# Patient Record
Sex: Female | Born: 1985
Health system: Southern US, Community
[De-identification: ages and names within clinical notes are randomized; demographics above are authoritative.]

## PROBLEM LIST (undated history)

## (undated) ENCOUNTER — Inpatient Hospital Stay (HOSPITAL_COMMUNITY): Payer: Self-pay

## (undated) DIAGNOSIS — A048 Other specified bacterial intestinal infections: Secondary | ICD-10-CM

## (undated) DIAGNOSIS — M549 Dorsalgia, unspecified: Secondary | ICD-10-CM

## (undated) DIAGNOSIS — Z8719 Personal history of other diseases of the digestive system: Secondary | ICD-10-CM

## (undated) DIAGNOSIS — F419 Anxiety disorder, unspecified: Secondary | ICD-10-CM

## (undated) DIAGNOSIS — R87629 Unspecified abnormal cytological findings in specimens from vagina: Secondary | ICD-10-CM

## (undated) DIAGNOSIS — Z8619 Personal history of other infectious and parasitic diseases: Secondary | ICD-10-CM

## (undated) DIAGNOSIS — W880XXA Exposure to X-rays, initial encounter: Secondary | ICD-10-CM

## (undated) DIAGNOSIS — Z8049 Family history of malignant neoplasm of other genital organs: Secondary | ICD-10-CM

## (undated) DIAGNOSIS — D649 Anemia, unspecified: Secondary | ICD-10-CM

## (undated) DIAGNOSIS — G43909 Migraine, unspecified, not intractable, without status migrainosus: Secondary | ICD-10-CM

## (undated) DIAGNOSIS — G4730 Sleep apnea, unspecified: Secondary | ICD-10-CM

## (undated) DIAGNOSIS — U071 COVID-19: Secondary | ICD-10-CM

## (undated) DIAGNOSIS — Z803 Family history of malignant neoplasm of breast: Secondary | ICD-10-CM

## (undated) HISTORY — PX: NO PAST SURGERIES: SHX2092

## (undated) HISTORY — DX: Migraine, unspecified, not intractable, without status migrainosus: G43.909

## (undated) HISTORY — DX: Unspecified abnormal cytological findings in specimens from vagina: R87.629

## (undated) HISTORY — DX: Personal history of other infectious and parasitic diseases: Z86.19

## (undated) HISTORY — DX: Family history of malignant neoplasm of breast: Z80.3

## (undated) HISTORY — DX: Anemia, unspecified: D64.9

## (undated) HISTORY — DX: Anxiety disorder, unspecified: F41.9

## (undated) HISTORY — DX: Dorsalgia, unspecified: M54.9

## (undated) HISTORY — DX: Sleep apnea, unspecified: G47.30

## (undated) HISTORY — DX: Personal history of other diseases of the digestive system: Z87.19

## (undated) HISTORY — DX: Family history of malignant neoplasm of other genital organs: Z80.49

## (undated) HISTORY — DX: Exposure to x-rays, initial encounter: W88.0XXA

---
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
## 2007-01-07 DIAGNOSIS — G43909 Migraine, unspecified, not intractable, without status migrainosus: Secondary | ICD-10-CM

## 2007-01-07 DIAGNOSIS — D649 Anemia, unspecified: Secondary | ICD-10-CM

## 2007-01-07 HISTORY — DX: Anemia, unspecified: D64.9

## 2007-01-07 HISTORY — DX: Migraine, unspecified, not intractable, without status migrainosus: G43.909

## 2008-01-07 DIAGNOSIS — Z8719 Personal history of other diseases of the digestive system: Secondary | ICD-10-CM

## 2008-01-07 DIAGNOSIS — M549 Dorsalgia, unspecified: Secondary | ICD-10-CM

## 2008-01-07 HISTORY — DX: Dorsalgia, unspecified: M54.9

## 2008-01-07 HISTORY — DX: Personal history of other diseases of the digestive system: Z87.19

## 2010-01-06 DIAGNOSIS — G4730 Sleep apnea, unspecified: Secondary | ICD-10-CM

## 2010-01-06 DIAGNOSIS — W880XXA Exposure to X-rays, initial encounter: Secondary | ICD-10-CM

## 2010-01-06 HISTORY — DX: Exposure to x-rays, initial encounter: W88.0XXA

## 2010-01-06 HISTORY — DX: Sleep apnea, unspecified: G47.30

## 2011-01-07 NOTE — L&D Delivery Note (Signed)
Delivery Note At 12:37 PM a viable female was delivered via Vaginal, Spontaneous Delivery (Presentation: ROA ).  Easy delivery of the head, loop of cord presenting above shoulder easy delivery of the shoulders, bulb suction mouth and nares, baby placed on pt abdomen cord doubly clamped and cut by support person. APGAR: 8, 9; weight .   Placenta status: schultze sponteneous intact Cord: 3 vessels  Anesthesia: Epidural  Episiotomy: none Lacerations: Vaginal lac Suture Repair: 3.0 monocryl for good hemostasis Est. Blood Loss (mL): 200  Mom to postpartum.  Baby to rooming in.  Kaelah Hayashi 10/14/2011, 1:12 PM

## 2011-02-18 ENCOUNTER — Encounter: Payer: Self-pay | Admitting: Family Medicine

## 2011-02-19 ENCOUNTER — Encounter: Payer: Self-pay | Admitting: Family Medicine

## 2011-02-19 ENCOUNTER — Other Ambulatory Visit (HOSPITAL_COMMUNITY)
Admission: RE | Admit: 2011-02-19 | Discharge: 2011-02-19 | Disposition: A | Discharge status: Home / Self Care | Payer: Self-pay | Source: Ambulatory Visit | Attending: Family Medicine | Admitting: Family Medicine

## 2011-02-19 ENCOUNTER — Ambulatory Visit (INDEPENDENT_AMBULATORY_CARE_PROVIDER_SITE_OTHER): Payer: Medicaid Other | Admitting: Family Medicine

## 2011-02-19 ENCOUNTER — Other Ambulatory Visit (HOSPITAL_COMMUNITY)
Admission: RE | Admit: 2011-02-19 | Discharge: 2011-02-19 | Disposition: A | Discharge status: Home / Self Care | Payer: Medicaid Other | Source: Ambulatory Visit | Attending: Family Medicine | Admitting: Family Medicine

## 2011-02-19 VITALS — BP 100/68 | HR 80 | Temp 98.3°F | Ht 62.5 in | Wt 139.1 lb

## 2011-02-19 DIAGNOSIS — K219 Gastro-esophageal reflux disease without esophagitis: Secondary | ICD-10-CM | POA: Insufficient documentation

## 2011-02-19 DIAGNOSIS — Z113 Encounter for screening for infections with a predominantly sexual mode of transmission: Secondary | ICD-10-CM | POA: Insufficient documentation

## 2011-02-19 DIAGNOSIS — K649 Unspecified hemorrhoids: Secondary | ICD-10-CM | POA: Insufficient documentation

## 2011-02-19 DIAGNOSIS — Z01419 Encounter for gynecological examination (general) (routine) without abnormal findings: Secondary | ICD-10-CM | POA: Insufficient documentation

## 2011-02-19 DIAGNOSIS — Z3201 Encounter for pregnancy test, result positive: Secondary | ICD-10-CM

## 2011-02-19 DIAGNOSIS — K644 Residual hemorrhoidal skin tags: Secondary | ICD-10-CM | POA: Insufficient documentation

## 2011-02-19 DIAGNOSIS — Z3400 Encounter for supervision of normal first pregnancy, unspecified trimester: Secondary | ICD-10-CM | POA: Insufficient documentation

## 2011-02-19 DIAGNOSIS — N912 Amenorrhea, unspecified: Secondary | ICD-10-CM

## 2011-02-19 DIAGNOSIS — Z331 Pregnant state, incidental: Secondary | ICD-10-CM

## 2011-02-19 DIAGNOSIS — IMO0002 Reserved for concepts with insufficient information to code with codable children: Secondary | ICD-10-CM | POA: Insufficient documentation

## 2011-02-19 LAB — POCT URINE PREGNANCY: Preg Test, Ur: POSITIVE

## 2011-02-19 MED ORDER — OMEPRAZOLE 40 MG PO CPDR: 40.0000 mg | DELAYED_RELEASE_CAPSULE | Freq: Every day | ORAL | Status: DC

## 2011-02-19 MED ORDER — ACETAMINOPHEN 500 MG PO TABS: 500.0000 mg | ORAL_TABLET | Freq: Four times a day (QID) | ORAL | Status: AC | PRN

## 2011-02-19 MED ORDER — PRENATAL RX 60-1 MG PO TABS: 1.0000 | ORAL_TABLET | Freq: Every day | ORAL | Status: DC

## 2011-02-19 MED ORDER — CALCIUM POLYCARBOPHIL 625 MG PO TABS: 625.0000 mg | ORAL_TABLET | Freq: Every day | ORAL | Status: DC

## 2011-02-19 MED ORDER — TUCKS 50 % EX PADS: 1.0000 "application " | MEDICATED_PAD | CUTANEOUS | Status: DC | PRN

## 2011-02-19 MED ORDER — VITAMIN B-6 100 MG PO TABS: 100.0000 mg | ORAL_TABLET | Freq: Every day | ORAL | Status: DC

## 2011-02-19 NOTE — Assessment & Plan Note (Addendum)
A: Pregnancy incidental finding at [redacted]w[redacted]d. P: -Pap done. -OB labs ordered. -continue prenatal vitamins -B6 for nausea  -reviewed early pregnancy precautions.  -f/u in  4 weeks.  -tylenol prn headache and low back pain.

## 2011-02-19 NOTE — Assessment & Plan Note (Signed)
A: GERD. Anticipate this will get worse with pregnancy. P: -TUMS -Prilosec

## 2011-02-19 NOTE — Patient Instructions (Signed)
[name] [name],  Thank you for coming to see me today. Congratulations on your pregnancy!  For nausea: take vitamin B6, eat small meals, eat cracker before getting up in the morning.  For your reflux: take the medication, omeprazole.  For back pain and headaches: tylenol is safe if pregnancy and we will start with this.  Avoid potentially harmful foods: raw fish, deli meats and cheeses. Avoid potentially harmful exposures: cigarette smoke, alcohol.   F/u in 4 weeks.   Dr. Armen Pickup   Pregnancy If you are planning on getting pregnant, it is a good idea to make a preconception appointment with your care- giver to discuss having a healthy lifestyle before getting pregnant. Such as, diet, weight, exercise, taking prenatal vitamins especially folic acid (it helps prevent brain and spinal cord defects), avoiding alcohol, smoking and illegal drugs, medical problems (diabetes, convulsions), family history of genetic problems, working conditions and immunizations. It is better to have knowledge of these things and do something about them before getting pregnant. In your pregnancy, it is important to follow certain guidelines to have a healthy baby. It is very important to get good prenatal care and follow your caregiver's instructions. Prenatal care includes all the medical care you receive before your baby's birth. This helps to prevent problems during the pregnancy and childbirth. HOME CARE INSTRUCTIONS   Start your prenatal visits by the 12th week of pregnancy or before when possible. They are usually scheduled monthly at first. They are more often in the last 2 months before delivery. It is important that you keep your caregiver's appointments and follow your caregiver's instructions regarding medication use, exercise, and diet.   During pregnancy, you are providing food for you and your baby. Eat a regular, well-balanced diet. Choose foods such as meat, fish, milk and other dairy products, vegetables, fruits,  whole-grain breads and cereals. Your caregiver will inform you of the ideal weight gain depending on your current height and weight. Drink lots of liquids. Try to drink 8 glasses of water a day.   Alcohol is associated with a number of birth defects including fetal alcohol syndrome. It is best to avoid alcohol completely. Smoking will cause low birth rate and prematurity. Use of alcohol and nicotine during your pregnancy also increases the chances that your child will be chemically dependent later in their life and may contribute to SIDS (Sudden Infant Death Syndrome).   Do not use illegal drugs.   Only take prescription or over-the-counter medications that are recommended by your caregiver. Other medications can cause genetic and physical problems in the baby.   Morning sickness can often be helped by keeping soda crackers at the bedside. Eat a couple before arising in the morning.   A sexual relationship may be continued until near the end of pregnancy if there are no other problems such as early (premature) leaking of amniotic fluid from the membranes, vaginal bleeding, painful intercourse or belly (abdominal) pain.   Exercise regularly. Check with your caregiver if you are unsure of the safety of some of your exercises.   Do not use hot tubs, steam rooms or saunas. These increase the risk of fainting or passing out and hurting yourself and the baby. Swimming is OK for exercise. Get plenty of rest, including afternoon naps when possible especially in the third trimester.   Avoid toxic odors and chemicals.   Do not wear high heels. They may cause you to lose your balance and fall.   Do not lift over 5 pounds.  If you do lift anything, lift with your legs and thighs, not your back.   Avoid long trips, especially in the third trimester.   If you have to travel out of the city or state, take a copy of your medical records with you.  SEEK IMMEDIATE MEDICAL CARE IF:   You develop an  unexplained oral temperature above 102 F (38.9 C), or as your caregiver suggests.   You have leaking of fluid from the vagina. If leaking membranes are suspected, take your temperature and inform your caregiver of this when you call.   There is vaginal spotting or bleeding. Notify your caregiver of the amount and how many pads are used.   You continue to feel sick to your stomach (nauseous) and have no relief from remedies suggested, or you throw up (vomit) blood or coffee ground like materials.   You develop upper abdominal pain.   You have round ligament discomfort in the lower abdominal area. This still must be evaluated by your caregiver.   You feel contractions of the uterus.   You do not feel the baby move, or there is less movement than before.   You have painful urination.   You have abnormal vaginal discharge.   You have persistent diarrhea.   You get a severe headache.   You have problems with your vision.   You develop muscle weakness.   You feel dizzy and faint.   You develop shortness of breath.   You develop chest pain.   You have back pain that travels down to your leg and feet.   You feel irregular or a very fast heartbeat.   You develop excessive weight gain in a short period of time (5 pounds in 3 to 5 days).   You are involved with a domestic violence situation.  Document Released: 12/23/2004 Document Revised: 09/04/2010 Document Reviewed: 06/16/2008 [facility] Patient Information 2012 [place], [place].

## 2011-02-19 NOTE — Assessment & Plan Note (Signed)
A: external hemorrhoid. No thrombosis. P: -tucks pads -fiber -increase intake of water -increase intake of fruits and veggies.

## 2011-02-19 NOTE — Progress Notes (Signed)
  Subjective:    Patient ID: [name] [name], female    DOB: 03/17/1985, 26 y.o.   MRN: [id number]  HPI New patient here to establish primary care and discuss the following:  1. Positive home pregnancy test yesterday: LMP 01/11/11. Positive headache, breast pain, persistent nausea with emesis x 1 (small volume), and low back pain. Has been taking prenatal vitamins for the last month. She is interested in having a pap smear done today, has never had one. Also interested in getting blood work.   2. Trace blood on stool yesterday: has history of hemorrhoids. Has hard stools and often stains with BM. Denies dark tarry stool. Denies weight loss and fever.   3. GERD: take Tums. Has never taken anything else. Reports reflux occurs throughout day and is not associated with meals, sleep, position.   Past Medical History  Diagnosis Date  . Anemia 2009  . DVT (deep venous thrombosis) 2012    once in 2012   . History of gastroesophageal reflux (GERD) 2010    taking with tums, helps   . Hypotension   . Sleep apnea 2012  . Migraines 2009  . Exposure to x-rays 2012    normal  . Back pain 2010   Past Surgical History  Procedure Date  . None'    History   Social History  . Marital Status: Married    Spouse Name: N/A    Number of Children: N/A  . Years of Education: N/A   Occupational History  . unemployed     Social History Main Topics  . Smoking status: Never Smoker   . Smokeless tobacco: Never Used  . Alcohol Use: No  . Drug Use: No  . Sexually Active: Yes    Birth Control/ Protection: None   Other Topics Concern  . Not on file   Social History Narrative   Moved to [place] from [place] 6 months ago. Married, husband age 2.Muslim-no pork, needs female provider. Mom is actively involved with patient. She has been here for 25 years.    No Known Allergies  Review of Systems As per HPI    Objective:   Physical Exam BP 100/68  Pulse 80  Temp(Src) 98.3 F (36.8 C) (Oral)  Ht 5'  2.5" (1.588 m)  Wt 139 lb 1.6 oz (63.095 kg)  BMI 25.04 kg/m2  LMP 01/11/2011 General appearance: alert, cooperative and no distress Head: Normocephalic, without obvious abnormality, atraumatic Eyes: conjunctivae/corneas clear. PERRL, EOM's intact. Fundi benign. Throat: lips, mucosa, and tongue normal; teeth and gums normal Neck: no adenopathy, no carotid bruit, no JVD, supple, symmetrical, trachea midline and thyroid not enlarged, symmetric, no tenderness/mass/nodules Lungs: clear to auscultation bilaterally Breasts: normal appearance, no masses or tenderness Heart: regular rate and rhythm, S1, S2 normal, no murmur, click, rub or gallop Abdomen: soft, non-tender; bowel sounds normal; no masses,  no organomegaly Pelvic: cervix normal in appearance, external genitalia normal, no adnexal masses or tenderness, no cervical motion tenderness, rectovaginal septum normal, uterus normal size, shape, and consistency and vagina normal without discharge rectum with small external hemorrhoid. No edema or thrombosis. No evidence of infection.  Extremities: extremities normal, atraumatic, no cyanosis or edema Pulses: 2+ and symmetric Skin: Skin color, texture, turgor normal. No rashes or lesions Neurologic: Grossly normal    Assessment & Plan:

## 2011-02-20 LAB — OBSTETRIC PANEL
Hemoglobin: 12.2 g/dL (ref 12.0–15.0)
Hepatitis B Surface Ag: NEGATIVE
Lymphs Abs: 2.1 10*3/uL (ref 0.7–4.0)
Monocytes Relative: 5 % (ref 3–12)
Neutro Abs: 4 10*3/uL (ref 1.7–7.7)
Neutrophils Relative %: 61 % (ref 43–77)
RBC: 4.35 MIL/uL (ref 3.87–5.11)
Rh Type: NEGATIVE

## 2011-03-04 ENCOUNTER — Encounter (HOSPITAL_COMMUNITY): Payer: Self-pay | Admitting: *Deleted

## 2011-03-04 ENCOUNTER — Inpatient Hospital Stay (HOSPITAL_COMMUNITY)
Admission: AD | Admit: 2011-03-04 | Discharge: 2011-03-04 | Disposition: A | Discharge status: Home / Self Care | Payer: Medicaid Other | Attending: Obstetrics & Gynecology | Admitting: Obstetrics & Gynecology

## 2011-03-04 ENCOUNTER — Inpatient Hospital Stay (HOSPITAL_COMMUNITY): Payer: Medicaid Other

## 2011-03-04 DIAGNOSIS — O219 Vomiting of pregnancy, unspecified: Secondary | ICD-10-CM

## 2011-03-04 DIAGNOSIS — O210 Mild hyperemesis gravidarum: Secondary | ICD-10-CM

## 2011-03-04 DIAGNOSIS — R109 Unspecified abdominal pain: Secondary | ICD-10-CM

## 2011-03-04 DIAGNOSIS — R1013 Epigastric pain: Secondary | ICD-10-CM | POA: Insufficient documentation

## 2011-03-04 DIAGNOSIS — O26899 Other specified pregnancy related conditions, unspecified trimester: Secondary | ICD-10-CM

## 2011-03-04 LAB — DIFFERENTIAL
Basophils Relative: 0 % (ref 0–1)
Lymphocytes Relative: 26 % (ref 12–46)
Lymphs Abs: 2.5 10*3/uL (ref 0.7–4.0)
Monocytes Relative: 5 % (ref 3–12)
Neutro Abs: 6.8 10*3/uL (ref 1.7–7.7)
Neutrophils Relative %: 69 % (ref 43–77)

## 2011-03-04 LAB — URINALYSIS, ROUTINE W REFLEX MICROSCOPIC
Leukocytes, UA: NEGATIVE
Nitrite: NEGATIVE
Protein, ur: NEGATIVE mg/dL
Urobilinogen, UA: 0.2 mg/dL (ref 0.0–1.0)

## 2011-03-04 LAB — COMPREHENSIVE METABOLIC PANEL
Albumin: 4.1 g/dL (ref 3.5–5.2)
Alkaline Phosphatase: 43 U/L (ref 39–117)
BUN: 5 mg/dL — ABNORMAL LOW (ref 6–23)
CO2: 26 mEq/L (ref 19–32)
Chloride: 95 mEq/L — ABNORMAL LOW (ref 96–112)
Potassium: 3.6 mEq/L (ref 3.5–5.1)
Total Bilirubin: 0.8 mg/dL (ref 0.3–1.2)

## 2011-03-04 LAB — CBC
Hemoglobin: 12.4 g/dL (ref 12.0–15.0)
MCHC: 34 g/dL (ref 30.0–36.0)
RBC: 4.37 MIL/uL (ref 3.87–5.11)
WBC: 9.8 10*3/uL (ref 4.0–10.5)

## 2011-03-04 LAB — LIPASE, BLOOD: Lipase: 34 U/L (ref 11–59)

## 2011-03-04 MED ORDER — RANITIDINE HCL 150 MG PO TABS: 150.0000 mg | ORAL_TABLET | Freq: Two times a day (BID) | ORAL | Status: DC

## 2011-03-04 MED ORDER — GI COCKTAIL ~~LOC~~
30.0000 mL | Freq: Once | ORAL | Status: AC
  Administered 2011-03-04: 30 mL via ORAL
  Filled 2011-03-04: qty 30

## 2011-03-04 MED ORDER — PROMETHAZINE HCL 25 MG PO TABS: 12.5000 mg | ORAL_TABLET | Freq: Four times a day (QID) | ORAL | Status: DC | PRN

## 2011-03-04 MED ORDER — ONDANSETRON HCL 4 MG/2ML IJ SOLN
4.0000 mg | Freq: Once | INTRAMUSCULAR | Status: AC
  Administered 2011-03-04: 4 mg via INTRAVENOUS
  Filled 2011-03-04: qty 2

## 2011-03-04 MED ORDER — THIAMINE HCL 100 MG/ML IJ SOLN
Freq: Once | INTRAVENOUS | Status: AC
  Administered 2011-03-04: 02:00:00 via INTRAVENOUS
  Filled 2011-03-04 (×2): qty 1000

## 2011-03-04 NOTE — Discharge Instructions (Signed)
YOUR BLOOD WORK AND URINE ARE NORMAL.  FOLLOW UP WITH YOUR DOCTOR AT THE FAMILY PRACTICE. RETURN HERE AS NEEDED.

## 2011-03-04 NOTE — ED Provider Notes (Signed)
History     CSN: [id number]  Arrival date & time 03/04/11  0014   None     Chief Complaint  Patient presents with  . Emesis During Pregnancy    HPI [name] [name] is a 26 y.o. female who presents to [facility] for nausea, vomiting and epigastric pain in early pregnancy. She is getting her prenatal care with [facility].   Past Medical History  Diagnosis Date  . Anemia 2009  . DVT (deep venous thrombosis) 2012    once in 2012   . History of gastroesophageal reflux (GERD) 2010    taking with tums, helps   . Hypotension   . Sleep apnea 2012  . Migraines 2009  . Exposure to x-rays 2012    normal  . Back pain 2010    Past Surgical History  Procedure Date  . None'   . No past surgeries     Family History  Problem Relation Age of Onset  . Asthma Mother   . Asthma Father   . Cancer    . Heart disease    . Diabetes    . Hyperlipidemia    . Hypertension    . Stroke    . Thyroid disease    . Diabetes    . Hypertension    . Hyperlipidemia      History  Substance Use Topics  . Smoking status: Never Smoker   . Smokeless tobacco: Never Used  . Alcohol Use: No    OB History    Grav Para Term Preterm Abortions TAB SAB Ect Mult Living   1               Review of Systems  Constitutional: Negative for fever and chills.  HENT: Negative for ear pain, congestion, sore throat, neck pain, dental problem and sinus pressure.   Eyes: Negative.   Respiratory: Negative for cough and wheezing.   Cardiovascular: Negative.   Gastrointestinal: Positive for nausea, vomiting and abdominal pain. Negative for diarrhea, constipation and abdominal distention.  Genitourinary: Negative for dysuria, frequency, flank pain, vaginal bleeding, vaginal discharge, difficulty urinating and pelvic pain.  Musculoskeletal: Negative for myalgias, back pain and gait problem.  Skin: Negative for color change and rash.  Neurological: Negative for dizziness, speech difficulty, weakness,  light-headedness, numbness and headaches.  Psychiatric/Behavioral: Negative for confusion and agitation.    Allergies  Review of patient's allergies indicates no known allergies.  Home Medications  No current outpatient prescriptions on file.  BP 98/66  Pulse 82  Temp 98.2 F (36.8 C)  Resp 18  SpO2 100%  LMP 01/11/2011  Physical Exam  Nursing note and vitals reviewed. Constitutional: She is oriented to person, place, and time. She appears well-developed and well-nourished. No distress.  HENT:  Head: Normocephalic.  Eyes: EOM are normal.  Neck: Neck supple.  Cardiovascular: Normal rate.   Pulmonary/Chest: Effort normal.  Abdominal: Soft. There is tenderness in the epigastric area. There is no rigidity, no rebound, no guarding and no CVA tenderness.  Musculoskeletal: Normal range of motion.  Neurological: She is alert and oriented to person, place, and time. No cranial nerve deficit.  Skin: Skin is warm and dry.  Psychiatric: She has a normal mood and affect. Her behavior is normal. Judgment and thought content normal.   Results for orders placed during the hospital encounter of 03/04/11 (from the past 24 hour(s))  URINALYSIS, ROUTINE W REFLEX MICROSCOPIC     Status: Abnormal   Collection Time  03/04/11 12:14 AM      Component Value Range   Color, Urine YELLOW  YELLOW    APPearance HAZY (*) CLEAR    Specific Gravity, Urine 1.015  1.005 - 1.030    pH 8.5 (*) 5.0 - 8.0    Glucose, UA NEGATIVE  NEGATIVE (mg/dL)   Hgb urine dipstick NEGATIVE  NEGATIVE    Bilirubin Urine NEGATIVE  NEGATIVE    Ketones, ur 15 (*) NEGATIVE (mg/dL)   Protein, ur NEGATIVE  NEGATIVE (mg/dL)   Urobilinogen, UA 0.2  0.0 - 1.0 (mg/dL)   Nitrite NEGATIVE  NEGATIVE    Leukocytes, UA NEGATIVE  NEGATIVE   CBC     Status: Normal   Collection Time   03/04/11  1:20 AM      Component Value Range   WBC 9.8  4.0 - 10.5 (K/uL)   RBC 4.37  3.87 - 5.11 (MIL/uL)   Hemoglobin 12.4  12.0 - 15.0 (g/dL)   HCT  14.7  82.9 - 56.2 (%)   MCV 83.5  78.0 - 100.0 (fL)   MCH 28.4  26.0 - 34.0 (pg)   MCHC 34.0  30.0 - 36.0 (g/dL)   RDW 13.0  86.5 - 78.4 (%)   Platelets 264  150 - 400 (K/uL)  DIFFERENTIAL     Status: Normal   Collection Time   03/04/11  1:20 AM      Component Value Range   Neutrophils Relative 69  43 - 77 (%)   Neutro Abs 6.8  1.7 - 7.7 (K/uL)   Lymphocytes Relative 26  12 - 46 (%)   Lymphs Abs 2.5  0.7 - 4.0 (K/uL)   Monocytes Relative 5  3 - 12 (%)   Monocytes Absolute 0.5  0.1 - 1.0 (K/uL)   Eosinophils Relative 1  0 - 5 (%)   Eosinophils Absolute 0.1  0.0 - 0.7 (K/uL)   Basophils Relative 0  0 - 1 (%)   Basophils Absolute 0.0  0.0 - 0.1 (K/uL)  COMPREHENSIVE METABOLIC PANEL     Status: Abnormal   Collection Time   03/04/11  1:20 AM      Component Value Range   Sodium 131 (*) 135 - 145 (mEq/L)   Potassium 3.6  3.5 - 5.1 (mEq/L)   Chloride 95 (*) 96 - 112 (mEq/L)   CO2 26  19 - 32 (mEq/L)   Glucose, Bld 85  70 - 99 (mg/dL)   BUN 5 (*) 6 - 23 (mg/dL)   Creatinine, Ser 6.96 (*) 0.50 - 1.10 (mg/dL)   Calcium 9.2  8.4 - 29.5 (mg/dL)   Total Protein 7.7  6.0 - 8.3 (g/dL)   Albumin 4.1  3.5 - 5.2 (g/dL)   AST 22  0 - 37 (U/L)   ALT 16  0 - 35 (U/L)   Alkaline Phosphatase 43  39 - 117 (U/L)   Total Bilirubin 0.8  0.3 - 1.2 (mg/dL)   GFR calc non Af Amer >90  >90 (mL/min)   GFR calc Af Amer >90  >90 (mL/min)  LIPASE, BLOOD     Status: Normal   Collection Time   03/04/11  1:20 AM      Component Value Range   Lipase 34  11 - 59 (U/L)   US Ob Comp Less 14 Wks  03/04/2011  *RADIOLOGY REPORT*  Clinical Data: Nausea, vomiting and cramping.  OBSTETRIC <14 WK ULTRASOUND  Technique:  Transabdominal ultrasound was performed for evaluation of the gestation as  well as the maternal uterus and adnexal regions.  Comparison:  None.  Intrauterine gestational sac: Visualized/normal in shape. Yolk sac: Present Embryo: Present Cardiac Activity: Present Heart Rate: 150 bpm  CRL:  11 mm  7w  2d            [place] EDC: 10/19/2011  Maternal uterus/Adnexae: There is an intrauterine pregnancy.  Left ovary measures 2.5 x 1.3 x 1.8 cm with a normal appearance.  Right ovary measures 2.4 x 1.1 x 1.3 cm.  No evidence of free fluid. No significant subchorionic hemorrhage.  IMPRESSION: Single live intrauterine pregnancy.  Calculated gestational age by ultrasound is 7 weeks 2 days.  Original Report Authenticated By: Richarda Overlie, M.D.   04:00 am patient feeling much better and ready to go home.  Assessment: Nausea and vomiting during pregnancy   Epigastric pain    Plan:  Rx Zantac   Rx Phenergan   Follow up with PCP    ED Course  Procedures  MDM          Kerrie Buffalo, NP 03/04/11 [phone]

## 2011-03-04 NOTE — Progress Notes (Signed)
Per interpreter pt reports she hasn't been able to keep anything down for last 2 days, feels dizzy, tired , feels like she can't breathe ( O2 sat 100%), having abd cramping. Lower back pain

## 2011-03-04 NOTE — ED Notes (Signed)
Mayer Camel NP notified of pt lab and U/S results.  Will continue to watch pt for 1 hr.

## 2011-03-14 ENCOUNTER — Inpatient Hospital Stay (HOSPITAL_COMMUNITY)
Admission: AD | Admit: 2011-03-14 | Discharge: 2011-03-15 | Disposition: A | Discharge status: Home / Self Care | Payer: Medicaid Other | Source: Ambulatory Visit | Attending: Obstetrics and Gynecology | Admitting: Obstetrics and Gynecology

## 2011-03-14 ENCOUNTER — Encounter (INDEPENDENT_AMBULATORY_CARE_PROVIDER_SITE_OTHER): Payer: Medicaid Other

## 2011-03-14 ENCOUNTER — Encounter (HOSPITAL_COMMUNITY): Payer: Self-pay | Admitting: *Deleted

## 2011-03-14 DIAGNOSIS — O210 Mild hyperemesis gravidarum: Secondary | ICD-10-CM | POA: Insufficient documentation

## 2011-03-14 DIAGNOSIS — Z331 Pregnant state, incidental: Secondary | ICD-10-CM

## 2011-03-14 DIAGNOSIS — Z789 Other specified health status: Secondary | ICD-10-CM | POA: Insufficient documentation

## 2011-03-14 DIAGNOSIS — R1013 Epigastric pain: Secondary | ICD-10-CM

## 2011-03-14 DIAGNOSIS — O26899 Other specified pregnancy related conditions, unspecified trimester: Secondary | ICD-10-CM | POA: Insufficient documentation

## 2011-03-14 DIAGNOSIS — O218 Other vomiting complicating pregnancy: Secondary | ICD-10-CM

## 2011-03-14 NOTE — Progress Notes (Signed)
G1 at 8wks. Seen in office this am. States was told should come here due to losing weight and something going on with blood. Some sharp pain in stomach and back. Vomiting for 3 days

## 2011-03-15 ENCOUNTER — Inpatient Hospital Stay (HOSPITAL_COMMUNITY): Payer: Medicaid Other

## 2011-03-15 LAB — DIFFERENTIAL
Basophils Relative: 0 % (ref 0–1)
Eosinophils Absolute: 0.1 10*3/uL (ref 0.0–0.7)
Eosinophils Relative: 1 % (ref 0–5)
Monocytes Absolute: 0.4 10*3/uL (ref 0.1–1.0)
Monocytes Relative: 5 % (ref 3–12)

## 2011-03-15 LAB — URINALYSIS, ROUTINE W REFLEX MICROSCOPIC
Bilirubin Urine: NEGATIVE
Glucose, UA: NEGATIVE mg/dL
Ketones, ur: NEGATIVE mg/dL
Nitrite: NEGATIVE
pH: 7 (ref 5.0–8.0)

## 2011-03-15 LAB — COMPREHENSIVE METABOLIC PANEL
Albumin: 3.6 g/dL (ref 3.5–5.2)
BUN: 5 mg/dL — ABNORMAL LOW (ref 6–23)
Creatinine, Ser: 0.58 mg/dL (ref 0.50–1.10)
Total Bilirubin: 0.4 mg/dL (ref 0.3–1.2)
Total Protein: 6.9 g/dL (ref 6.0–8.3)

## 2011-03-15 LAB — URINE MICROSCOPIC-ADD ON

## 2011-03-15 LAB — CBC
HCT: 33.9 % — ABNORMAL LOW (ref 36.0–46.0)
Hemoglobin: 11.6 g/dL — ABNORMAL LOW (ref 12.0–15.0)
MCH: 28.6 pg (ref 26.0–34.0)
MCHC: 34.2 g/dL (ref 30.0–36.0)

## 2011-03-15 LAB — AMYLASE: Amylase: 142 U/L — ABNORMAL HIGH (ref 0–105)

## 2011-03-15 LAB — LIPASE, BLOOD: Lipase: 83 U/L — ABNORMAL HIGH (ref 11–59)

## 2011-03-15 MED ORDER — ONDANSETRON 8 MG PO TBDP: 8.0000 mg | ORAL_TABLET | Freq: Three times a day (TID) | ORAL | Status: AC | PRN

## 2011-03-15 MED ORDER — DICYCLOMINE HCL 10 MG PO CAPS
20.0000 mg | ORAL_CAPSULE | Freq: Once | ORAL | Status: AC
  Administered 2011-03-15: 20 mg via ORAL
  Filled 2011-03-15: qty 2

## 2011-03-15 MED ORDER — LACTATED RINGERS IV BOLUS (SEPSIS)
1000.0000 mL | Freq: Once | INTRAVENOUS | Status: AC
  Administered 2011-03-15: 1000 mL via INTRAVENOUS

## 2011-03-15 MED ORDER — ONDANSETRON HCL 4 MG/2ML IJ SOLN
4.0000 mg | Freq: Once | INTRAMUSCULAR | Status: AC
  Administered 2011-03-15: 4 mg via INTRAVENOUS
  Filled 2011-03-15: qty 2

## 2011-03-15 MED ORDER — LACTATED RINGERS IV SOLN
INTRAVENOUS | Status: DC
  Administered 2011-03-15: 03:00:00 via INTRAVENOUS

## 2011-03-15 MED ORDER — PROMETHAZINE HCL 50 MG RE SUPP: 25.0000 mg | Freq: Four times a day (QID) | RECTAL | Status: DC | PRN

## 2011-03-15 MED ORDER — PANTOPRAZOLE SODIUM 20 MG PO TBEC: 20.0000 mg | DELAYED_RELEASE_TABLET | Freq: Every day | ORAL | Status: DC

## 2011-03-15 MED ORDER — DICYCLOMINE HCL 10 MG PO CAPS: 10.0000 mg | ORAL_CAPSULE | Freq: Once | ORAL | Status: DC

## 2011-03-15 MED ORDER — DICYCLOMINE HCL 20 MG PO TABS: 20.0000 mg | ORAL_TABLET | Freq: Four times a day (QID) | ORAL | Status: DC

## 2011-03-15 MED ORDER — PANTOPRAZOLE SODIUM 40 MG IV SOLR
40.0000 mg | Freq: Once | INTRAVENOUS | Status: AC
  Administered 2011-03-15: 40 mg via INTRAVENOUS
  Filled 2011-03-15: qty 40

## 2011-03-15 MED ORDER — PROMETHAZINE HCL 25 MG/ML IJ SOLN
25.0000 mg | Freq: Once | INTRAMUSCULAR | Status: AC
  Administered 2011-03-15: 25 mg via INTRAVENOUS
  Filled 2011-03-15: qty 1

## 2011-03-15 NOTE — ED Notes (Signed)
Heat pack to hand to help with IV start.

## 2011-03-15 NOTE — ED Notes (Signed)
Pt up to BR via w/c

## 2011-03-15 NOTE — ED Notes (Signed)
Almond Lint CNM notified pt returned from u/s and u/s results. Will come see pt

## 2011-03-15 NOTE — ED Notes (Signed)
2 attempts to start IV unsuccessful. Berniece Salines RN AC called and will start IV.

## 2011-03-15 NOTE — Discharge Instructions (Signed)
Hyperemesis Gravidarum  Hyperemesis gravidarum is a severe form of nausea and vomiting that happens during pregnancy. Hyperemesis is worse than morning sickness. It may cause a woman to have nausea or vomiting all day for many days. It may keep a woman from eating and drinking enough food and liquids. Hyperemesis usually occurs during the first half (the first 20 weeks) of pregnancy. It often goes away once a woman is in her second half of pregnancy. However, sometimes hyperemesis continues through an entire pregnancy.   CAUSES   The cause of this condition is not completely known but is thought to be due to changes in the body's hormones when pregnant. It could be the high level of the pregnancy hormone or an increase in estrogen in the body.   SYMPTOMS    Severe nausea and vomiting.   Nausea that does not go away.   Vomiting that does not allow you to keep any food down.   Weight loss and body fluid loss (dehydration).   Having no desire to eat or not liking food you have previously enjoyed.  DIAGNOSIS   Your caregiver may ask you about your symptoms. Your caregiver may also order blood tests and urine tests to make sure something else is not causing the problem.   TREATMENT   You may only need medicine to control the problem. If medicines do not control the nausea and vomiting, you will be treated in the hospital to prevent dehydration, acidosis, weight loss, and changes in the electrolytes in your body that may harm the unborn baby (fetus). You may need intravenous (IV) fluids.   HOME CARE INSTRUCTIONS    Take all medicine as directed by your caregiver.   Try eating a couple of dry crackers or toast in the morning before getting out of bed.   Avoid foods and smells that upset your stomach.   Avoid fatty and spicy foods. Eat 5 to 6 small meals a day.   Do not drink when eating meals. Drink between meals.   For snacks, eat high protein foods, such as cheese. Eat or suck on things that have ginger in  them. Ginger helps nausea.   Avoid food preparation. The smell of food can spoil your appetite.   Avoid iron pills and iron in your multivitamins until after 3 to 4 months of being pregnant.  SEEK MEDICAL CARE IF:    Your abdominal pain increases since the last time you saw your caregiver.   You have a severe headache.   You develop vision problems.   You feel you are losing weight.  SEEK IMMEDIATE MEDICAL CARE IF:    You are unable to keep fluids down.   You vomit blood.   You have constant nausea and vomiting.   You have a fever.   You have excessive weakness, dizziness, fainting, or extreme thirst.  MAKE SURE YOU:    Understand these instructions.   Will watch your condition.   Will get help right away if you are not doing well or get worse.  Document Released: 12/23/2004 Document Revised: 12/12/2010 Document Reviewed: 03/25/2010  [employer] Patient Information 2012 [employer].

## 2011-03-15 NOTE — Progress Notes (Signed)
Almond Lint CNM in earlier to discuss d/c and medications with pt and spouse. Understanding voiced.

## 2011-03-15 NOTE — ED Notes (Signed)
Up to BR with husband by her side

## 2011-03-15 NOTE — ED Notes (Signed)
Pt to u/s via w/c

## 2011-03-15 NOTE — ED Notes (Signed)
Sanda Klein CNM in to see pt and discuss plan of care

## 2011-03-15 NOTE — ED Notes (Signed)
0230 Shelly Lillard CNM in to see pt. Pt then up to BR with spouse by her side

## 2011-03-15 NOTE — ED Notes (Signed)
Almond Lint CNM in discussing plan for u/s of gallbladder to pt and pt's spouse

## 2011-03-15 NOTE — ED Provider Notes (Signed)
History     Chief Complaint  Patient presents with  . Emesis During Pregnancy   HPI Comments: Pt is a 26yo G1P0 at 9w. Arrived unannounced with cc of N/V and mid epigastric and back pain. States she was seen at office today and told you had a urinary infection and was given medication for n/v but it has not helped. Pt denies any pelvic cramping, bleeding or d/c.      Past Medical History  Diagnosis Date  . Anemia 2009  . DVT (deep venous thrombosis) 2012    once in 2012   . History of gastroesophageal reflux (GERD) 2010    taking with tums, helps   . Hypotension   . Sleep apnea 2012  . Migraines 2009  . Exposure to x-rays 2012    normal  . Back pain 2010    Past Surgical History  Procedure Date  . None'   . No past surgeries     Family History  Problem Relation Age of Onset  . Asthma Mother   . Asthma Father   . Cancer    . Heart disease    . Diabetes    . Hyperlipidemia    . Hypertension    . Stroke    . Thyroid disease    . Diabetes    . Hypertension    . Hyperlipidemia      History  Substance Use Topics  . Smoking status: Never Smoker   . Smokeless tobacco: Never Used  . Alcohol Use: No    Allergies: No Known Allergies  Prescriptions prior to admission  Medication Sig Dispense Refill  . acetaminophen (TYLENOL) 500 MG tablet Take 500 mg by mouth every 6 (six) hours as needed. For pain      . calcium carbonate (TUMS - DOSED IN MG ELEMENTAL CALCIUM) 500 MG chewable tablet Chew 1 tablet by mouth 3 (three) times daily.      . Prenatal Vit-Fe Fumarate-FA (PRENATAL MULTIVITAMIN) 60-1 MG tablet Take 1 tablet by mouth daily.  100 tablet    . ranitidine (ZANTAC) 150 MG tablet Take 1 tablet (150 mg total) by mouth 2 (two) times daily.  30 tablet  0    Review of Systems  Gastrointestinal: Positive for heartburn, nausea, vomiting, abdominal pain and constipation.       No BM x2 weeks  All other systems reviewed and are negative.   Physical Exam   Blood  pressure 110/70, pulse 80, temperature 98 F (36.7 C), temperature source Oral, resp. rate 20, height 5\' 1"  (1.549 m), weight 59.512 kg (131 lb 3.2 oz), last menstrual period 01/11/2011.  Physical Exam  Nursing note and vitals reviewed. Constitutional: She is oriented to person, place, and time. She appears well-developed and well-nourished. No distress.  HENT:  Head: Normocephalic.  Neck: Normal range of motion.  Cardiovascular: Normal rate, regular rhythm and normal heart sounds.   Respiratory: Effort normal and breath sounds normal.  GI: Soft. Bowel sounds are normal. She exhibits no distension and no mass. There is tenderness. There is no rebound and no guarding.       Tender mid epigastric  Genitourinary:       deferred  Musculoskeletal: Normal range of motion. She exhibits no edema.  Neurological: She is alert and oriented to person, place, and time.  Skin: Skin is warm and dry.  Psychiatric: She has a normal mood and affect. Her behavior is normal.    MAU Course  Procedures  Assessment and Plan  IUP at 9wks Hyperemesis R/o gallbladder issue  Send UA IVF's Phenergan/zofran/protonix CBC, CMET, amylase, lipase  Will then try PO challenge   Beauford Lando M 03/15/2011, 12:15 AM   Addendum:  03/15/11 at 0313  Pt states nausea is better and pain is slightly better, sleepy now  Labs WNL, except for elevated amylase and lipase  Per pt last ate at 2000  Will give bentyl now  And perform abdominal US at 0400  D/W Dr. Orvan July, CNM  Addendum: 3/9 at 0507   Abdominal US normal Pt resting  D/C home Rx: protonix, bentyl, phenergan, zofran Recommend miralax for BM  F/U office 2 weeks. disccussed hyperemesis diet  S.Jareb Radoncic, CNM

## 2011-03-17 ENCOUNTER — Encounter: Payer: Self-pay | Admitting: Family Medicine

## 2011-04-02 ENCOUNTER — Encounter (INDEPENDENT_AMBULATORY_CARE_PROVIDER_SITE_OTHER): Payer: Medicaid Other | Admitting: Obstetrics and Gynecology

## 2011-04-02 DIAGNOSIS — Z3480 Encounter for supervision of other normal pregnancy, unspecified trimester: Secondary | ICD-10-CM

## 2011-04-02 DIAGNOSIS — R87612 Low grade squamous intraepithelial lesion on cytologic smear of cervix (LGSIL): Secondary | ICD-10-CM

## 2011-04-02 DIAGNOSIS — N898 Other specified noninflammatory disorders of vagina: Secondary | ICD-10-CM

## 2011-04-02 HISTORY — PX: COLPOSCOPY: SHX161

## 2011-04-03 ENCOUNTER — Inpatient Hospital Stay (HOSPITAL_COMMUNITY)
Admission: AD | Admit: 2011-04-03 | Discharge: 2011-04-07 | DRG: 781 | Disposition: A | Discharge status: Home / Self Care | Payer: Medicaid Other | Attending: Obstetrics and Gynecology | Admitting: Obstetrics and Gynecology

## 2011-04-03 DIAGNOSIS — F32A Depression, unspecified: Secondary | ICD-10-CM | POA: Diagnosis present

## 2011-04-03 DIAGNOSIS — D649 Anemia, unspecified: Secondary | ICD-10-CM | POA: Diagnosis present

## 2011-04-03 DIAGNOSIS — O210 Mild hyperemesis gravidarum: Secondary | ICD-10-CM | POA: Diagnosis present

## 2011-04-03 DIAGNOSIS — O219 Vomiting of pregnancy, unspecified: Secondary | ICD-10-CM

## 2011-04-03 DIAGNOSIS — F4321 Adjustment disorder with depressed mood: Secondary | ICD-10-CM | POA: Diagnosis present

## 2011-04-03 DIAGNOSIS — E871 Hypo-osmolality and hyponatremia: Secondary | ICD-10-CM | POA: Diagnosis present

## 2011-04-03 DIAGNOSIS — IMO0002 Reserved for concepts with insufficient information to code with codable children: Secondary | ICD-10-CM

## 2011-04-03 DIAGNOSIS — Z789 Other specified health status: Secondary | ICD-10-CM

## 2011-04-03 DIAGNOSIS — O26899 Other specified pregnancy related conditions, unspecified trimester: Secondary | ICD-10-CM

## 2011-04-03 DIAGNOSIS — O99019 Anemia complicating pregnancy, unspecified trimester: Secondary | ICD-10-CM | POA: Diagnosis present

## 2011-04-03 DIAGNOSIS — F329 Major depressive disorder, single episode, unspecified: Secondary | ICD-10-CM | POA: Diagnosis present

## 2011-04-03 DIAGNOSIS — O99340 Other mental disorders complicating pregnancy, unspecified trimester: Secondary | ICD-10-CM | POA: Diagnosis present

## 2011-04-03 DIAGNOSIS — R634 Abnormal weight loss: Secondary | ICD-10-CM

## 2011-04-03 DIAGNOSIS — R824 Acetonuria: Secondary | ICD-10-CM

## 2011-04-03 DIAGNOSIS — E876 Hypokalemia: Secondary | ICD-10-CM | POA: Diagnosis present

## 2011-04-04 ENCOUNTER — Encounter (HOSPITAL_COMMUNITY): Payer: Self-pay | Admitting: *Deleted

## 2011-04-04 LAB — COMPREHENSIVE METABOLIC PANEL
ALT: 10 U/L (ref 0–35)
AST: 17 U/L (ref 0–37)
Calcium: 8.8 mg/dL (ref 8.4–10.5)
Creatinine, Ser: 0.36 mg/dL — ABNORMAL LOW (ref 0.50–1.10)
GFR calc Af Amer: >90 mL/min (ref >90)
GFR calc non Af Amer: >90 mL/min (ref >90)
Glucose, Bld: 194 mg/dL — ABNORMAL HIGH (ref 70–99)
Sodium: 130 mEq/L — ABNORMAL LOW (ref 135–145)
Total Protein: 6.3 g/dL (ref 6.0–8.3)

## 2011-04-04 LAB — URINE MICROSCOPIC-ADD ON

## 2011-04-04 LAB — URINALYSIS, ROUTINE W REFLEX MICROSCOPIC
Nitrite: NEGATIVE
Specific Gravity, Urine: 1.015 (ref 1.005–1.030)
Urobilinogen, UA: 1 mg/dL (ref 0.0–1.0)
pH: 6.5 (ref 5.0–8.0)

## 2011-04-04 LAB — CBC
Hemoglobin: 10.1 g/dL — ABNORMAL LOW (ref 12.0–15.0)
MCH: 28.3 pg (ref 26.0–34.0)
Platelets: 166 10*3/uL (ref 150–400)
RBC: 3.57 MIL/uL — ABNORMAL LOW (ref 3.87–5.11)
WBC: 6.2 10*3/uL (ref 4.0–10.5)

## 2011-04-04 LAB — TSH: TSH: 0.716 u[IU]/mL (ref 0.350–4.500)

## 2011-04-04 MED ORDER — DEXTROSE 5 % IN LACTATED RINGERS IV BOLUS: 1000.0000 mL | Freq: Once | INTRAVENOUS | Status: DC

## 2011-04-04 MED ORDER — KCL IN DEXTROSE-NACL 20-5-0.9 MEQ/L-%-% IV SOLN
INTRAVENOUS | Status: DC
  Filled 2011-04-04 (×2): qty 1000

## 2011-04-04 MED ORDER — PROMETHAZINE HCL 25 MG/ML IJ SOLN
12.5000 mg | INTRAMUSCULAR | Status: DC | PRN
  Administered 2011-04-05: 12.5 mg via INTRAVENOUS
  Filled 2011-04-04: qty 1

## 2011-04-04 MED ORDER — ACETAMINOPHEN 650 MG RE SUPP
650.0000 mg | RECTAL | Status: DC | PRN
  Administered 2011-04-04: 650 mg via RECTAL
  Filled 2011-04-04 (×2): qty 1

## 2011-04-04 MED ORDER — DOCUSATE SODIUM 50 MG/5ML PO LIQD
100.0000 mg | Freq: Every day | ORAL | Status: DC
  Administered 2011-04-04: 100 mg via ORAL
  Filled 2011-04-04 (×4): qty 10

## 2011-04-04 MED ORDER — ONDANSETRON HCL 4 MG/2ML IJ SOLN
4.0000 mg | INTRAMUSCULAR | Status: DC | PRN
  Administered 2011-04-04 – 2011-04-06 (×2): 4 mg via INTRAVENOUS
  Filled 2011-04-04 (×2): qty 2

## 2011-04-04 MED ORDER — DEXTROSE 5 % IN LACTATED RINGERS IV BOLUS
1000.0000 mL | Freq: Once | INTRAVENOUS | Status: AC
  Administered 2011-04-04: 1000 mL via INTRAVENOUS

## 2011-04-04 MED ORDER — BUTORPHANOL TARTRATE 2 MG/ML IJ SOLN
1.0000 mg | Freq: Once | INTRAMUSCULAR | Status: AC
  Administered 2011-04-04: 1 mg via INTRAVENOUS
  Filled 2011-04-04: qty 1

## 2011-04-04 MED ORDER — DIPHENHYDRAMINE HCL 50 MG/ML IJ SOLN: 25.0000 mg | Freq: Four times a day (QID) | INTRAMUSCULAR | Status: DC | PRN

## 2011-04-04 MED ORDER — ONDANSETRON 8 MG/NS 50 ML IVPB
8.0000 mg | Freq: Once | INTRAVENOUS | Status: AC
  Administered 2011-04-04: 8 mg via INTRAVENOUS
  Filled 2011-04-04: qty 8

## 2011-04-04 MED ORDER — COMPLETENATE 29-1 MG PO CHEW
1.0000 | CHEWABLE_TABLET | Freq: Every day | ORAL | Status: DC
  Administered 2011-04-04: 1 via ORAL
  Filled 2011-04-04 (×4): qty 1

## 2011-04-04 MED ORDER — POTASSIUM CHLORIDE 2 MEQ/ML IV SOLN
INTRAVENOUS | Status: DC
  Administered 2011-04-04 – 2011-04-07 (×10): via INTRAVENOUS
  Filled 2011-04-04 (×14): qty 1000

## 2011-04-04 NOTE — Progress Notes (Addendum)
Clinical Social Work Department BRIEF PSYCHOSOCIAL ASSESSMENT 04/04/2011  Patient:  [facility]     Account Number:  [id number]     Admit date:  04/03/2011  Clinical Social Worker:  Andy Gauss  Date/Time:  04/04/2011 03:53 PM  Referred by:  Physician  Date Referred:  04/04/2011 Referred for  Behavioral Health Issues   Other Referral:   Depression   Interview type:  Patient Other interview type:    PSYCHOSOCIAL DATA Living Status:  HUSBAND Admitted from facility:   Level of care:   Primary support name:   Primary support relationship to patient:  FAMILY Degree of support available:    CURRENT CONCERNS  Other Concerns:    SOCIAL WORK ASSESSMENT / PLAN Sw referral received to assess pt's depression symptoms, as noted in chart.  Pt explained that she moved here from [place] 6 months ago and entered into an arranged marriage. Pt told Sw that she is adjusting to the [place] and also a man whom she does not know.  Pt reports that things are a little better between them now.  He is 17 yrs. her senior.  Pt is not interested in taking medication or therapy at this time.  She plans to give it a couple more months to see how the relationship progresses.  Pt may be interested in mediation in the future if depression symptoms do not improve.  Pt asked Sw and interpreter not to tell her husband about this conversation.  She reports feeling safe in her home at this time.  Pt is concerned about the infant being affected by her depression symptoms, as she does not eat when depressed.  Pt has her grandmother and aunt as support people.  She denies SI.  Sw provided pt with contact information and encouraged her to call for additional resources in the future.   Assessment/plan status:  No Further Intervention Required Other assessment/ plan:   Information/referral to community resources:    PATIENT'S/FAMILY'S RESPONSE TO PLAN OF CARE: Pt was very pleasant with Sw and appreciative of Sw consult.

## 2011-04-04 NOTE — Progress Notes (Addendum)
26 y.o. year old female,at [redacted]w[redacted]d gestation.  SUBJECTIVE:  The patient reports through her interpreter that she feels better, but is still nauseated. She has tolerated only a small amount of clear liquids.  OBJECTIVE:  BP 118/83  Pulse 71  Temp(Src) 97.7 F (36.5 C) (Oral)  Resp 19  Ht 5\' 2"  (1.575 m)  Wt 128 lb (58.06 kg)  BMI 23.41 kg/m2  SpO2 100%  LMP 01/11/2011  Fetal Heart Tones:  168 beats per minute  Contractions:          None  Chest: Clear  Heart: Regular rate and rhythm  Abdomen: Soft and slightly tender  Pre-albumin: 14.7  CBC    Component Value Date/Time   WBC 6.2 04/04/2011 0200   RBC 3.57* 04/04/2011 0200   HGB 10.1* 04/04/2011 0200   HCT 29.3* 04/04/2011 0200   PLT 166 04/04/2011 0200   MCV 82.1 04/04/2011 0200   MCH 28.3 04/04/2011 0200   MCHC 34.5 04/04/2011 0200   RDW 12.7 04/04/2011 0200   LYMPHSABS 2.7 03/15/2011 0015   MONOABS 0.4 03/15/2011 0015   EOSABS 0.1 03/15/2011 0015   BASOSABS 0.0 03/15/2011 0015    CMP     Component Value Date/Time   NA 130* 04/04/2011 0200   K 2.9* 04/04/2011 0200   CL 98 04/04/2011 0200   CO2 21 04/04/2011 0200   GLUCOSE 194* 04/04/2011 0200   BUN 4* 04/04/2011 0200   CREATININE 0.36* 04/04/2011 0200   CALCIUM 8.8 04/04/2011 0200   PROT 6.3 04/04/2011 0200   ALBUMIN 3.5 04/04/2011 0200   AST 17 04/04/2011 0200   ALT 10 04/04/2011 0200   ALKPHOS 31* 04/04/2011 0200   BILITOT 0.9 04/04/2011 0200   GFRNONAA >90 04/04/2011 0200   GFRAA >90 04/04/2011 0200    ASSESSMENT:  [redacted]w[redacted]d Weeks Pregnancy  Hyperemesis  Hypokalemia  Anemia  PLAN:  We will continue in hospital support.  We will obtain a Ecologist.  We will obtain a nutrition consult.   Replace K.  Leonard Schwartz, M.D.

## 2011-04-04 NOTE — MAU Note (Signed)
Pt reports she has been having a lot of vomiting throughout preg. States the meds are not helping, has not had anything to eat or drink in 2 days, body aches. Has had a headache x 3 days. Also reports a clear watery discharge

## 2011-04-04 NOTE — H&P (Signed)
[name] [name] is a 26 y.o. married arabic female presenting unannounced with CC of n/v, unable to keep food or fluid down for 2 days, weakness, and headache.  Pt last in office Wed 3/27 and had colposcopy done with dr. Pennie [name].  Pt had family member call around 2230 and inquired if "sex" ok s/p colposcopy and had numerous questions r/e discharge after colpo, what is hpv virus, and significance of abnormal pap smear; at that time, the family member didn't report any of the complaints the patient has presented with to MAU.  Pt also reports some lower abdominal pain, not constant.  Has had intercourse since colposcopy.  Last emesis episode about one week ago.  Seen in MAU 03/04/11, and 03/15/11 for n/v and weight loss.  Pt is unsure how much total weight loss thus far.  On 3/9 pt prescribed Bentyl, Protonix, and Phenergan suppository--she has had Phenergan within 6 hrs of presenting to MAU.  Last BM on Wed and pt reports "hard" and "painful;"  [name]/o hemorrhoid.  Accompanied by her husband.  Denies VB.  No fever or resp c/o's.  Has tried "tylenol" for headache w/o relief.  Husband reports unable to keep down water or liquids.   Maternal Medical History:  Reason for admission: Reason for admission: nausea.  Prenatal complications: 1.  Language barrier 2.  Psychosocial issues 3.  Weight loss in first trimester 4.  abnl pap smear w/ colposcopy done 04/02/11 5.  N/v of pregnancy w/ 2 other visits in MAU for issue 6.  Elevated amylase and lipase on 3/9, but normal abdominal u/s 7.  [name]/o migraines 8.  [name]/o GERD    OB History    Grav Para Term Preterm Abortions TAB SAB Ect Mult Living   1              Past Medical History  Diagnosis Date  . Anemia 2009  . DVT (deep venous thrombosis) 2012    once in 2012   . History of gastroesophageal reflux (GERD) 2010    taking with tums, helps   . Hypotension   . Sleep apnea 2012  . Migraines 2009  . Exposure to x-rays 2012    normal  . Back pain 2010   Past Surgical  History  Procedure Date  . None'   . No past surgeries    Family History: family history includes Asthma in her father and mother; Cancer in an unspecified family member; Diabetes in some unspecified family members; Heart disease in an unspecified family member; Hyperlipidemia in some unspecified family members; Hypertension in some unspecified family members; Stroke in an unspecified family member; and Thyroid disease in an unspecified family member. Social History:  reports that she has never smoked. She has never used smokeless tobacco. She reports that she does not drink alcohol or use illicit drugs.  Review of Systems  Constitutional: Positive for weight loss and malaise/fatigue.  Eyes: Negative.   Respiratory: Negative.   Cardiovascular: Negative.   Gastrointestinal: Positive for nausea, vomiting, abdominal pain and constipation.  Genitourinary: Negative.   Musculoskeletal: Positive for myalgias.  Skin: Negative.   Neurological: Positive for weakness and headaches.      Blood pressure 123/81, pulse 87, temperature 98.7 F (37.1 C), resp. rate 18, height 5\' 2"  (1.575 m), weight 57.607 kg (127 lb), last menstrual period 01/11/2011, SpO2 100.00%. Maternal Exam:  Abdomen: Patient reports generalized tenderness.  Introitus: Normal vulva. Normal vagina.  Cervix: Cervix evaluated by sterile speculum exam.  Fetal Exam Fetal Monitor Review: Mode: hand-held doppler probe.   Baseline rate: 160's.      Physical Exam  Constitutional: She is oriented to person, place, and time. She appears well-developed and well-nourished. No distress.       Malaise;  Language barrier  Cardiovascular: Normal rate.   Respiratory: Effort normal.  GI: Soft. She exhibits no distension and no mass. There is generalized tenderness. There is no rebound and no guarding.       Generalized tenderness  Genitourinary:       sm amt of creamy-colored d/c in vault; no blood or lesion  Neurological: She is  alert and oriented to person, place, and time.  Skin: Skin is warm and dry.    Prenatal labs: ABO, Rh: A/NEG/-- (02/13 1223) Antibody: NEG (02/13 1223) Rubella: 91.0 (02/13 1223) RPR: NON REAC (02/13 1223)  HBsAg: NEGATIVE (02/13 1223)  HIV: NON REACTIVE (02/13 1223)  GBS:   not done yet  Assessment/Plan: 1.  IUP at 11.6 weeks 2.  N/v of pregnancy w/ 1st trimester weight loss 3.  Hyponatremia, hypokalemia, & ketonuria 4.  Headache and general malaise/weakness 5.  Language barrier 6.  S/p colposcopy 04/02/11 7.  [name]/o elevated amylase and lipase w/ normal abdominal u/s 03/15/11 8.  Constipation 9.  Low Hgb w/ [name]/o anemia 10.  Suspect mild allergy to stadol (itching after given in MAU)  1.  Per c/w dr. Pennie [name], will admit for 23 hr observation for IV hydration  2.  Advance diet as tolerated this AM; antinausea meds prn 3.  Switch IVF to D5NS with 20KCL (117ml/hr)--pt received one liter D5LR in MAU 4.  Benadryl prn continued itching  5.  Prealbumin and TSH still pending  [name] [name] 04/04/2011, 3:23 AM

## 2011-04-04 NOTE — MAU Note (Signed)
SIDERAILS UP X2 - HUSBAND AT BEDSIDE.

## 2011-04-04 NOTE — Progress Notes (Signed)
INITIAL ADULT NUTRITION ASSESSMENT Date: 04/04/2011   Time: 3:29 PM Reason for Assessment: MD Consult/ Education   ASSESSMENT: Female 26 y.o.  Dx:  Patient Active Problem List  Diagnoses  . Pregnancy as incidental finding  . GERD (gastroesophageal reflux disease)  . External hemorrhoid    Hx: Hyperemesis, cyclical for entire pregnancy Related Meds:     . butorphanol  1 mg Intravenous Once  . dextrose 5% lactated ringers  1,000 mL Intravenous Once  . docusate  100 mg Oral Daily  . ondansetron (ZOFRAN) IV  8 mg Intravenous Once  . prenatal vitamin w/FE, FA  1 tablet Oral Daily  . DISCONTD: dextrose 5% lactated ringers  1,000 mL Intravenous Once    Ht: 5\' 2"  (157.5 cm)  Wt: 128 lb (58.06 kg)  Ideal Wt: 50.1 kg  % Ideal Wt: 116 %  Usual Wt: 139 Lbs/  63.2 kg % Usual Wt: 92%  Body mass index is 23.41 kg/(m^2).  Food/Nutrition Related Hx: Cyclical Hyperemesis for 11 weeks per family's report. Able to eat small amts last week, Currently on day 4 with no solid food intake. Able to take sips of C/L when I was present  Labs:  CMP     Component Value Date/Time   NA 130* 04/04/2011 0200   K 2.9* 04/04/2011 0200   CL 98 04/04/2011 0200   CO2 21 04/04/2011 0200   GLUCOSE 194* 04/04/2011 0200   BUN 4* 04/04/2011 0200   CREATININE 0.36* 04/04/2011 0200   CALCIUM 8.8 04/04/2011 0200   PROT 6.3 04/04/2011 0200   ALBUMIN 3.5 04/04/2011 0200   AST 17 04/04/2011 0200   ALT 10 04/04/2011 0200   ALKPHOS 31* 04/04/2011 0200   BILITOT 0.9 04/04/2011 0200   GFRNONAA >90 04/04/2011 0200   GFRAA >90 04/04/2011 0200     Intake:   Intake/Output Summary (Last 24 hours) at 04/04/11 1532 Last data filed at 04/04/11 2956  Gross per 24 hour  Intake 277.08 ml  Output    750 ml  Net -472.92 ml     Diet Order: General, C/L advanced to regular  Supplements/Tube Feeding: none  IVF:    dextrose 5 % and 0.9% NaCl 1,000 mL with potassium chloride 20 mEq infusion Last Rate: 125 mL/hr at 04/04/11  1154  DISCONTD: dextrose 5 % and 0.9 % NaCl with KCl 20 mEq/L     Estimated Nutritional Needs:   Kcal: 15-1700 Protein: 70-85 grams Fluid: 1.7 L  NUTRITION DIAGNOSIS: -Inadequate oral intake (NI-2.1).  Status: Ongoing r/t hyperemesis aeb weight loss and pt report  MONITORING/EVALUATION(Goals): Improved Po intake, and weight gain  EDUCATION NEEDS: -Education needs addressed Pt and family educated on diet for hyperemesis. Interpreter present. Questions answered  INTERVENTION: C/L diet advanced to antenatal regular, ( snacks TID ) Pt unable to swallow PNV. Consider adding MVI to IVF  Dietitian #:[id number]  DOCUMENTATION CODES Per approved criteria  -Non-severe (moderate) malnutrition in the context of acute illness or injury   Meets Dx criteria for mod degree of malnutrition, > 5 % loss of usual weight over 1 month and < 75 % of estimated needs ( intake) for > 7 days Kem Parcher,KATHY 04/04/2011, 3:29 PM

## 2011-04-04 NOTE — MAU Note (Signed)
UP TO B-ROOM 

## 2011-04-05 MED ORDER — ACETAMINOPHEN 500 MG PO TABS
1000.0000 mg | ORAL_TABLET | Freq: Four times a day (QID) | ORAL | Status: DC | PRN
  Administered 2011-04-05: 1000 mg via ORAL

## 2011-04-05 NOTE — Progress Notes (Signed)
26 y.o. year old female,at [redacted]w[redacted]d gestation.  SUBJECTIVE:  The patient has tolerated eating grapes. She feels better. She is resting better.  OBJECTIVE:  BP 91/55  Pulse 69  Temp(Src) 98.5 F (36.9 C) (Oral)  Resp 18  Ht 5\' 2"  (1.575 m)  Wt 58.514 kg (129 lb)  BMI 23.59 kg/m2  SpO2 100%  LMP 01/11/2011  Fetal Heart Tones:  160 beats per minute  Contractions:          None  Chest: Clear  Heart: Regular rate and rhythm  Abdomen: Nontender, bowel sounds are positive  ASSESSMENT:  [redacted]w[redacted]d Weeks Pregnancy  Hyperemesis  Improving slowly  PLAN:  We will advance the patient's diet today. We will use anti-emetics as necessary. We'll to discharge the patient home tomorrow.  Leonard Schwartz, M.D.

## 2011-04-06 MED ORDER — NALBUPHINE SYRINGE 5 MG/0.5 ML
10.0000 mg | INJECTION | Freq: Four times a day (QID) | INTRAMUSCULAR | Status: DC | PRN
  Administered 2011-04-06: 10 mg via INTRAVENOUS
  Filled 2011-04-06: qty 1

## 2011-04-06 MED ORDER — MENTHOL 3 MG MT LOZG
1.0000 | LOZENGE | OROMUCOSAL | Status: DC | PRN
  Filled 2011-04-06: qty 9

## 2011-04-06 MED ORDER — ZOLPIDEM TARTRATE 5 MG PO TABS
5.0000 mg | ORAL_TABLET | Freq: Every evening | ORAL | Status: DC | PRN
  Filled 2011-04-06: qty 1

## 2011-04-06 NOTE — Progress Notes (Signed)
Subjective:  The patient reports that she had vomiting overnight. She complains of a vague abdominal pain. She says she has had headaches ever since she was pregnant. She feels she cannot be discharged to home today.  Objective:  BP 91/58  Pulse 63  Temp(Src) 98.2 F (36.8 C) (Oral)  Resp 18  Ht 5\' 2"  (1.575 m)  Wt 59.932 kg (132 lb 2 oz)  BMI 24.17 kg/m2  SpO2 100%  LMP 01/11/2011  HEENT: The patient appears sad.  Chest: Clear  Heart: Regular rate and rhythm  Abdomen: Nontender to exam  Assessment:  12 weeks and 1 day gestation  Hyperemesis  Depression  Plan:  We will continue IV fluids. The patient will be encouraged to eat.  Tylenol for headaches.  We will have the social worker follow with the patient and make arrangements for appropriate depression counseling.  The patient was told that she needs to go home as soon as possible.  Mylinda Latina.D.

## 2011-04-07 DIAGNOSIS — O210 Mild hyperemesis gravidarum: Secondary | ICD-10-CM

## 2011-04-07 DIAGNOSIS — F329 Major depressive disorder, single episode, unspecified: Secondary | ICD-10-CM | POA: Diagnosis present

## 2011-04-07 LAB — COMPREHENSIVE METABOLIC PANEL
Albumin: 2.7 g/dL — ABNORMAL LOW (ref 3.5–5.2)
Alkaline Phosphatase: 29 U/L — ABNORMAL LOW (ref 39–117)
BUN: <3 mg/dL — ABNORMAL LOW (ref 6–23)
Calcium: 8.4 mg/dL (ref 8.4–10.5)
Creatinine, Ser: 0.47 mg/dL — ABNORMAL LOW (ref 0.50–1.10)
GFR calc Af Amer: >90 mL/min (ref >90)
Glucose, Bld: 89 mg/dL (ref 70–99)
Potassium: 3.6 mEq/L (ref 3.5–5.1)
Total Protein: 5.5 g/dL — ABNORMAL LOW (ref 6.0–8.3)

## 2011-04-07 MED ORDER — ONDANSETRON HCL 4 MG PO TABS: 4.0000 mg | ORAL_TABLET | Freq: Three times a day (TID) | ORAL | Status: DC | PRN

## 2011-04-07 MED ORDER — DOCUSATE SODIUM 50 MG/5ML PO LIQD: 100.0000 mg | Freq: Every day | ORAL | Status: DC

## 2011-04-07 NOTE — Discharge Summary (Signed)
Physician Discharge Summary  Patient ID: Anthonia Monger MRN: [id number] DOB/AGE: 10/04/1985 26 y.o.  Admit date: 04/03/2011 Discharge date: 04/07/2011  Admission Diagnoses:Hyperemesis                                        Situational depression  Discharge Diagnoses: Same. Both improved Active Problems:  Hyperemesis arising during pregnancy  Depression complicating pregnancy, antepartum   Discharged Condition: good  Hospital Course: Antiemetics and IV fluids were given.  A social work consult was obtained.  The patient declined psychiatric consultation.  She admitted to feeling better with respect to nausea and depression prior to discharge.  She was taking a regular diet.  Consults: social work  Significant Diagnostic Studies: labs:   Treatments: IV hydration and antiemetics  Discharge Exam: Blood pressure 93/60, pulse 81, temperature 98.2 F (36.8 C), temperature source Oral, resp. rate 18, height 5\' 2"  (1.575 m), weight 134 lb 4 oz (60.895 kg), last menstrual period 01/11/2011, SpO2 100.00%. General appearance: alert, cooperative, no distress and smiling GI: soft, non-tender; bowel sounds normal; no masses,  no organomegaly  Disposition: 01-Home or Self Care  Discharge Orders    Future Appointments: Provider: Department: Dept Phone: Center:   04/11/2011 8:30 AM Dessa Phi, MD [facility] [phone] [facility]   05/05/2011 4:00 PM Hal Morales, MD [facility] [phone] None     Medication List  As of 04/07/2011 10:17 AM   STOP taking these medications         ranitidine 150 MG tablet         TAKE these medications         acetaminophen 500 MG tablet   Commonly known as: TYLENOL   Take 500 mg by mouth every 6 (six) hours as needed. For pain      calcium carbonate 500 MG chewable tablet   Commonly known as: TUMS - dosed in mg elemental calcium   Chew 1 tablet by mouth 3 (three) times daily.      dicyclomine 20 MG tablet   Commonly known as: BENTYL   Take 1  tablet (20 mg total) by mouth every 6 (six) hours.      docusate 50 MG/5ML liquid   Commonly known as: COLACE   Take 10 mLs (100 mg total) by mouth daily.      ondansetron 4 MG tablet   Commonly known as: ZOFRAN   Take 1 tablet (4 mg total) by mouth every 8 (eight) hours as needed for nausea.      pantoprazole 20 MG tablet   Commonly known as: PROTONIX   Take 1 tablet (20 mg total) by mouth daily.           Follow-up Information    Follow up with Aretta Stetzel P, MD in 4 weeks. (pt has appt)    Contact information:   [address] [place] [address] [phone]          Signed: Hal Morales 04/07/2011, 10:17 AM

## 2011-04-07 NOTE — Progress Notes (Signed)
Sw consult noted however pt discharged before Sw could see.

## 2011-04-07 NOTE — Discharge Instructions (Signed)
Morning Sickness Morning sickness is when you feel sick to your stomach (nauseous) during pregnancy. You may feel sick to your stomach and throw up (vomit). You may feel sick in the morning, but you can feel this way any time of day. Some women feel very sick to their stomach and cannot stop throwing up (hyperemesis gravidarum). HOME CARE  Take multivitamins as told by your doctor. Taking multivitamins before getting pregnant can stop or lessen the harshness of morning sickness.   Eat dry toast or unsalted crackers before getting out of bed.   Eat 5 to 6 small meals a day.   Eat dry and bland foods like rice and baked potatoes.   Do not drink liquids with meals. Drink between meals.   Do not eat greasy, fatty, or spicy foods.   Have someone cook for you if the smell of food causes you to feel sick or throw up.   Do not take vitamins with iron, or as told by your doctor.   Eat protein when you need a snack (nuts, yogurt, cheese).   Eat unsweetened gelatins for dessert.   Wear a bracelet used for sea sickness (acupressure wristband).   Go to a doctor that puts thin needles into certain body points (acupuncture) to improve how you feel.   Do not smoke.   Use a humidifier to keep the air in your house free of odors.  GET HELP RIGHT AWAY IF:   You feel very sick to your stomach and cannot stop throwing up.   You pass out (faint).   You have a fever.   You need medicine to feel better.   You feel dizzy or lightheaded.   You are losing weight.   You need help knowing what to eat and what not to eat.  MAKE SURE YOU:   Understand these instructions.   Will watch your condition.   Will get help right away if you are not doing well or get worse.  Document Released: 01/31/2004 Document Revised: 12/12/2010 Document Reviewed: 03/22/2009 [employer] Patient Information 2012 [employer]. 

## 2011-04-11 ENCOUNTER — Encounter: Payer: Self-pay | Admitting: Family Medicine

## 2011-04-11 ENCOUNTER — Telehealth: Payer: Self-pay | Admitting: Family Medicine

## 2011-04-11 MED ORDER — OMEGA-3-ACID ETHYL ESTERS 1 G PO CAPS: 1.0000 g | ORAL_CAPSULE | Freq: Four times a day (QID) | ORAL | Status: DC

## 2011-04-11 NOTE — Telephone Encounter (Signed)
Called patient back she reports that she has bee on lovaza for many years for history of fatty liver and as a nutritional supplement. She spoke to her OB who states that it is safe in pregnancy although category C. Reviewed LFTs from recent admission ([facility]l). Plan to refill x 1 month. Pt to come in to check cholesterol and LFTs at f/u.

## 2011-04-11 NOTE — Telephone Encounter (Signed)
Patient is wanting a refill on Lovava, which was never prescribed by Dr. Armen Pickup.  Her OB said it is ok to take during pregnancy but it has to be prescribed by her PCP.  She uses [facility] on [address].  Patient had an appt this morning but said the morning sickness was so bad she couldn't make it.  She did reschedule for 5/1.

## 2011-04-12 ENCOUNTER — Inpatient Hospital Stay (HOSPITAL_COMMUNITY)
Admission: AD | Admit: 2011-04-12 | Discharge: 2011-04-12 | Disposition: A | Discharge status: Home / Self Care | Payer: Medicaid Other | Source: Ambulatory Visit | Attending: Obstetrics and Gynecology | Admitting: Obstetrics and Gynecology

## 2011-04-12 ENCOUNTER — Encounter (HOSPITAL_COMMUNITY): Payer: Self-pay | Admitting: *Deleted

## 2011-04-12 DIAGNOSIS — O99891 Other specified diseases and conditions complicating pregnancy: Secondary | ICD-10-CM | POA: Insufficient documentation

## 2011-04-12 DIAGNOSIS — B349 Viral infection, unspecified: Secondary | ICD-10-CM

## 2011-04-12 DIAGNOSIS — B9789 Other viral agents as the cause of diseases classified elsewhere: Secondary | ICD-10-CM | POA: Insufficient documentation

## 2011-04-12 LAB — URINE MICROSCOPIC-ADD ON

## 2011-04-12 LAB — URINALYSIS, ROUTINE W REFLEX MICROSCOPIC
Glucose, UA: NEGATIVE mg/dL
Hgb urine dipstick: NEGATIVE
Ketones, ur: 15 mg/dL — AB
Nitrite: NEGATIVE
Protein, ur: NEGATIVE mg/dL
Specific Gravity, Urine: 1.01 (ref 1.005–1.030)
Urobilinogen, UA: 0.2 mg/dL (ref 0.0–1.0)
pH: 8.5 — ABNORMAL HIGH (ref 5.0–8.0)

## 2011-04-12 MED ORDER — ACETAMINOPHEN 325 MG PO TABS: 650.0000 mg | ORAL_TABLET | Freq: Four times a day (QID) | ORAL | Status: DC | PRN

## 2011-04-12 MED ORDER — IBUPROFEN 200 MG PO TABS: 600.0000 mg | ORAL_TABLET | Freq: Four times a day (QID) | ORAL | Status: AC

## 2011-04-12 MED ORDER — BUTALBITAL-APAP-CAFFEINE 50-325-40 MG PO TABS
1.0000 | ORAL_TABLET | ORAL | Status: DC | PRN
  Administered 2011-04-12: 1 via ORAL
  Filled 2011-04-12: qty 1

## 2011-04-12 MED ORDER — LORATADINE-PSEUDOEPHEDRINE ER 5-120 MG PO TB12: 1.0000 | ORAL_TABLET | Freq: Two times a day (BID) | ORAL | Status: DC

## 2011-04-12 NOTE — MAU Note (Signed)
Pt also reports not eating or drinking today, and feels dizzy when stands. She states she kept food down yesterday.

## 2011-04-12 NOTE — MAU Note (Signed)
Pt husband reports pt has not been eating because she is so nauseated. Pt has lost 9 pounds since last visit.

## 2011-04-12 NOTE — Progress Notes (Signed)
Written and verbal d/c instructions given and understanding voiced. 

## 2011-04-12 NOTE — MAU Provider Note (Signed)
History    CSN: [id number]  Arrival date and time: 04/12/11 1758   None    Chief Complaint  Patient presents with  . Generalized Body Aches  . Sore Throat  . Nausea   Sore Throat  This is a new problem. The current episode started in the past 7 days. The problem has been unchanged. Neither side of throat is experiencing more pain than the other. There has been no fever. The pain is moderate. Associated symptoms include congestion, coughing and ear pain. She has tried acetaminophen for the symptoms. The treatment provided no relief.   Pt presents unannounced to MAU at 13.0wks with c/o generalized body aches, weakness, sore throat, cough and bilat ear pain x 2 days.  Also continues with nausea and intermittent vomiting.  Denies any vaginal bleeding.  Has been exposed to family members with similar symptoms.    OB History    Grav Para Term Preterm Abortions TAB SAB Ect Mult Living   1               Past Medical History  Diagnosis Date  . Anemia 2009  . History of gastroesophageal reflux (GERD) 2010    taking with tums, helps   . Hypotension   . Sleep apnea 2012  . Migraines 2009  . Exposure to x-rays 2012    normal  . Back pain 2010    Past Surgical History  Procedure Date  . None'   . No past surgeries     Family History  Problem Relation Age of Onset  . Asthma Mother   . Asthma Father   . Cancer    . Heart disease    . Stroke    . Thyroid disease    . Diabetes    . Hypertension    . Hyperlipidemia    . Anesthesia problems Neg Hx     History  Substance Use Topics  . Smoking status: Never Smoker   . Smokeless tobacco: Never Used  . Alcohol Use: No    Allergies: No Known Allergies  Prescriptions prior to admission  Medication Sig Dispense Refill  . acetaminophen (TYLENOL) 500 MG tablet Take 500 mg by mouth every 6 (six) hours as needed. For pain      . promethazine (PHENERGAN) 25 MG suppository Place 25 mg rectally every 8 (eight) hours as needed.  nausea        Review of Systems  Constitutional: Negative.   HENT: Positive for ear pain and congestion.   Eyes: Negative.   Respiratory: Positive for cough.   Cardiovascular: Negative.   Gastrointestinal: Negative.   Genitourinary: Negative.   Musculoskeletal: Negative.   Skin: Negative.   Neurological: Negative.   Endo/Heme/Allergies: Negative.   Psychiatric/Behavioral: Negative.    Physical Exam   Blood pressure 124/78, pulse 90, temperature 98.5 F (36.9 C), temperature source Oral, resp. rate 18, height 5\' 2"  (1.575 m), weight 56.881 kg (125 lb 6.4 oz), last menstrual period 01/11/2011.  Physical Exam  Constitutional: She is oriented to person, place, and time. She appears well-developed and well-nourished.  HENT:  Head: Normocephalic and atraumatic.  Right Ear: External ear normal.  Left Ear: External ear normal.  Nose: Nose normal.  Mouth/Throat: Oropharynx is clear and moist.       TMs without redness.  Visualization of TMs ltd due to cerumen.    Eyes: Conjunctivae are normal. Pupils are equal, round, and reactive to light.  Neck: Normal range of motion. Neck supple. No  thyromegaly present.  Cardiovascular: Normal rate, regular rhythm, normal heart sounds and intact distal pulses.   Respiratory: Effort normal and breath sounds normal.  GI: Soft. Bowel sounds are normal.  Genitourinary:       Pelvic deferred  Musculoskeletal: Normal range of motion.  Neurological: She is alert and oriented to person, place, and time. She has normal reflexes.  Skin: Skin is warm and dry.  Psychiatric: Her behavior is normal.       Pt affect flat and teary at times.  FHTs 160's with doppler  MAU Course  Procedures Results for orders placed during the hospital encounter of 04/12/11 (from the past 24 hour(s))  URINALYSIS, ROUTINE W REFLEX MICROSCOPIC     Status: Abnormal   Collection Time   04/12/11  6:10 PM      Component Value Range   Color, Urine YELLOW  YELLOW    APPearance  CLEAR  CLEAR    Specific Gravity, Urine 1.010  1.005 - 1.030    pH 8.5 (*) 5.0 - 8.0    Glucose, UA NEGATIVE  NEGATIVE (mg/dL)   Hgb urine dipstick NEGATIVE  NEGATIVE    Bilirubin Urine MODERATE (*) NEGATIVE    Ketones, ur 15 (*) NEGATIVE (mg/dL)   Protein, ur NEGATIVE  NEGATIVE (mg/dL)   Urobilinogen, UA 0.2  0.0 - 1.0 (mg/dL)   Nitrite NEGATIVE  NEGATIVE    Leukocytes, UA SMALL (*) NEGATIVE   URINE MICROSCOPIC-ADD ON     Status: Abnormal   Collection Time   04/12/11  6:10 PM      Component Value Range   Squamous Epithelial / LPF FEW (*) RARE    WBC, UA 7-10  <3 (WBC/hpf)   RBC / HPF 0-2  <3 (RBC/hpf)   Bacteria, UA FEW (*) RARE     Assessment and Plan  IUP at 13.0wks Viral syndrome  Consult obtained with Dr. Pennie Rushing Will discharge to home. Rec Claritin D OTC as well as Motrin 600mg  (200mg  x 3) every 6hrs x 3 days. Continue currently prescribed medications for nausea and vomiting.   F/U in office as scheduled on 05/05/11.    Zethan Alfieri O. 04/12/2011, 9:37 PM

## 2011-04-12 NOTE — MAU Note (Signed)
Pt reports having body aches and ear ache and sore throat 2 days.

## 2011-05-02 DIAGNOSIS — IMO0002 Reserved for concepts with insufficient information to code with codable children: Secondary | ICD-10-CM

## 2011-05-02 DIAGNOSIS — Z6791 Unspecified blood type, Rh negative: Secondary | ICD-10-CM

## 2011-05-02 DIAGNOSIS — Z789 Other specified health status: Secondary | ICD-10-CM

## 2011-05-02 DIAGNOSIS — Z758 Other problems related to medical facilities and other health care: Secondary | ICD-10-CM

## 2011-05-02 DIAGNOSIS — O26899 Other specified pregnancy related conditions, unspecified trimester: Secondary | ICD-10-CM

## 2011-05-05 ENCOUNTER — Encounter: Payer: Self-pay | Admitting: Obstetrics and Gynecology

## 2011-05-05 ENCOUNTER — Ambulatory Visit (INDEPENDENT_AMBULATORY_CARE_PROVIDER_SITE_OTHER): Payer: Medicaid Other | Admitting: Obstetrics and Gynecology

## 2011-05-05 ENCOUNTER — Encounter: Payer: Medicaid Other | Admitting: Obstetrics and Gynecology

## 2011-05-05 VITALS — BP 108/60 | Temp 98.5°F | Wt 128.0 lb

## 2011-05-05 DIAGNOSIS — Z331 Pregnant state, incidental: Secondary | ICD-10-CM

## 2011-05-05 DIAGNOSIS — F329 Major depressive disorder, single episode, unspecified: Secondary | ICD-10-CM

## 2011-05-05 DIAGNOSIS — Z603 Acculturation difficulty: Secondary | ICD-10-CM

## 2011-05-05 DIAGNOSIS — R300 Dysuria: Secondary | ICD-10-CM

## 2011-05-05 DIAGNOSIS — O99340 Other mental disorders complicating pregnancy, unspecified trimester: Secondary | ICD-10-CM

## 2011-05-05 DIAGNOSIS — F489 Nonpsychotic mental disorder, unspecified: Secondary | ICD-10-CM

## 2011-05-05 DIAGNOSIS — F32A Depression, unspecified: Secondary | ICD-10-CM

## 2011-05-05 DIAGNOSIS — O2610 Low weight gain in pregnancy, unspecified trimester: Secondary | ICD-10-CM

## 2011-05-05 DIAGNOSIS — R51 Headache: Secondary | ICD-10-CM

## 2011-05-05 DIAGNOSIS — Z789 Other specified health status: Secondary | ICD-10-CM

## 2011-05-05 DIAGNOSIS — O26899 Other specified pregnancy related conditions, unspecified trimester: Secondary | ICD-10-CM

## 2011-05-05 DIAGNOSIS — O9989 Other specified diseases and conditions complicating pregnancy, childbirth and the puerperium: Secondary | ICD-10-CM

## 2011-05-05 DIAGNOSIS — R519 Headache, unspecified: Secondary | ICD-10-CM

## 2011-05-05 DIAGNOSIS — Z609 Problem related to social environment, unspecified: Secondary | ICD-10-CM

## 2011-05-05 LAB — POCT URINALYSIS DIPSTICK
Bilirubin, UA: NEGATIVE
pH: 5

## 2011-05-05 MED ORDER — IBUPROFEN 600 MG PO TABS: 600.0000 mg | ORAL_TABLET | Freq: Four times a day (QID) | ORAL | Status: AC | PRN
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
## 2011-05-05 NOTE — Progress Notes (Signed)
1) wt stable from last visit 2) c/o headaches 3) she and family admit that she is homesick, but eating is improved.  Husband states that she is angry all the time.  He has offered to send her home to her family, but her aunt wants her to deliver in this country and feels she will feel better once she has her anatomy U/S. 4) C/O feelining dizzy.  Came to room in a wheelchair. Recommend counseling for pt and her husband if she decides to stay in [place]. Quad screen NV

## 2011-05-05 NOTE — Patient Instructions (Addendum)
Depression  Depression is a strong emotion of feeling unhappy that can last for weeks, months, or even longer. Depression causes problems with the ability to function in life. It upsets your:   Relationships.   Sleep.   Eating habits.   Work habits.  HOME CARE  Take all medicine as told by your doctor.   Talk with a therapist, counselor, or friend.   Eat a healthy diet.   Exercise regularly.   Do not drink alcohol or use drugs.  GET HELP RIGHT AWAY IF: You start to have thoughts about hurting yourself or others. MAKE SURE YOU:  Understand these instructions.   Will watch your condition.   Will get help right away if you are not doing well or get worse.  Document Released: 01/25/2010 Document Revised: 12/12/2010 Document Reviewed: 01/25/2010 Exit[employer] Patient Information 2012 [employer]. 

## 2011-05-07 ENCOUNTER — Ambulatory Visit (INDEPENDENT_AMBULATORY_CARE_PROVIDER_SITE_OTHER): Payer: Medicaid Other | Admitting: Family Medicine

## 2011-05-07 VITALS — BP 107/51 | Temp 97.7°F | Wt 129.1 lb

## 2011-05-07 DIAGNOSIS — E781 Pure hyperglyceridemia: Secondary | ICD-10-CM | POA: Insufficient documentation

## 2011-05-07 DIAGNOSIS — Z3480 Encounter for supervision of other normal pregnancy, unspecified trimester: Secondary | ICD-10-CM

## 2011-05-07 LAB — CULTURE, OB URINE

## 2011-05-07 MED ORDER — OMEGA-3-ACID ETHYL ESTERS 1 G PO CAPS: 1.0000 g | ORAL_CAPSULE | Freq: Every day | ORAL | Status: DC

## 2011-05-09 ENCOUNTER — Telehealth: Payer: Self-pay | Admitting: Family Medicine

## 2011-05-09 NOTE — Telephone Encounter (Signed)
Pt informed. [name] [name] Dawn  

## 2011-05-09 NOTE — Telephone Encounter (Signed)
Please call patient to advise what she can take for flu like symptoms per patient.

## 2011-05-09 NOTE — Progress Notes (Signed)
S: 26 yo G1P0 presents to f/u prenatal labs, have lovaza filled and to inform me that she will receive prenatal care from Dr. Pennie Rushing at [facility]. She started taking phenergan PR from her nausea (which helps). She is drinking well (mostly soda) and her oral intake of solids has improved.  O: see flowsheet  A/P 26 yo primigravida at [redacted]w[redacted]d.  Anatomy U/S-ordered by OB Prescribed lovaza will check lipids at postpartum follow-up Informed patient and her mother that she should see Dr. Pennie Rushing only for now and come back to see me in the post-partum period.

## 2011-05-09 NOTE — Telephone Encounter (Signed)
Please advise patient to call OB with medical complaints or concerns during pregnancy.

## 2011-05-16 ENCOUNTER — Encounter: Payer: Self-pay | Admitting: Obstetrics and Gynecology

## 2011-05-19 ENCOUNTER — Other Ambulatory Visit: Payer: Self-pay | Admitting: Obstetrics and Gynecology

## 2011-05-19 ENCOUNTER — Ambulatory Visit (INDEPENDENT_AMBULATORY_CARE_PROVIDER_SITE_OTHER): Payer: Medicaid Other

## 2011-05-19 ENCOUNTER — Ambulatory Visit (INDEPENDENT_AMBULATORY_CARE_PROVIDER_SITE_OTHER): Payer: Medicaid Other | Admitting: Obstetrics and Gynecology

## 2011-05-19 VITALS — BP 108/60 | Wt 131.0 lb

## 2011-05-19 DIAGNOSIS — Z331 Pregnant state, incidental: Secondary | ICD-10-CM

## 2011-05-19 DIAGNOSIS — O2610 Low weight gain in pregnancy, unspecified trimester: Secondary | ICD-10-CM

## 2011-05-19 DIAGNOSIS — O26899 Other specified pregnancy related conditions, unspecified trimester: Secondary | ICD-10-CM

## 2011-05-19 LAB — US OB COMP + 14 WK

## 2011-05-19 NOTE — Progress Notes (Signed)
The patient reports that she feels much better.  She is slowly gaining weight. Ultrasound single gestation.  Normal fluid.  18 weeks and 3 days (55th percentile).  Cervix 3.24 cm.  Anatomy normal.  Female. Diet and nutrition discussed. Patient requests a letter to have family members assist with her care. Return office in 4 weeks.  Dr. Stefano Gaul

## 2011-05-19 NOTE — Progress Notes (Signed)
Pt and her husband state they have no concerns today. Pt states she needs PNV's.

## 2011-06-06 ENCOUNTER — Ambulatory Visit (INDEPENDENT_AMBULATORY_CARE_PROVIDER_SITE_OTHER): Payer: Medicaid Other | Admitting: Family Medicine

## 2011-06-06 VITALS — BP 103/68 | Temp 98.3°F | Wt 136.0 lb

## 2011-06-06 DIAGNOSIS — L708 Other acne: Secondary | ICD-10-CM

## 2011-06-06 DIAGNOSIS — L709 Acne, unspecified: Secondary | ICD-10-CM | POA: Insufficient documentation

## 2011-06-06 DIAGNOSIS — O2686 Pruritic urticarial papules and plaques of pregnancy (PUPPP): Secondary | ICD-10-CM

## 2011-06-06 DIAGNOSIS — L218 Other seborrheic dermatitis: Secondary | ICD-10-CM

## 2011-06-06 DIAGNOSIS — L210 Seborrhea capitis: Secondary | ICD-10-CM

## 2011-06-06 DIAGNOSIS — O26899 Other specified pregnancy related conditions, unspecified trimester: Secondary | ICD-10-CM

## 2011-06-06 DIAGNOSIS — L988 Other specified disorders of the skin and subcutaneous tissue: Secondary | ICD-10-CM

## 2011-06-06 HISTORY — DX: Pruritic urticarial papules and plaques of pregnancy (puppp): O26.86

## 2011-06-06 MED ORDER — CLINDAMYCIN PHOS-BENZOYL PEROX 1-5 % EX GEL: Freq: Two times a day (BID) | CUTANEOUS | Status: DC

## 2011-06-06 MED ORDER — TRIAMCINOLONE ACETONIDE 0.025 % EX OINT: TOPICAL_OINTMENT | Freq: Two times a day (BID) | CUTANEOUS | Status: DC

## 2011-06-06 NOTE — Patient Instructions (Signed)
Thank you for coming in to see me today.  Please do the following: 1. Use kenalog gel on rash on chest. Also get a good fitting maternity bra.  2. Use benzaclin gel on acne spots twice daily. Buy an acne wash like clean and clear to use at night.  3. For hair: buy a moisturizing oil (like argon, coconut, olive), try to wash hair every other day.  F/u as needed.   Dr. Armen Pickup

## 2011-06-06 NOTE — Assessment & Plan Note (Signed)
PUPP: treat mild topical steroid cream. Advised patient to get well fitting maternity bra. Informed patient that rash and itching will completely resolve in the postpartum period.

## 2011-06-06 NOTE — Assessment & Plan Note (Signed)
Acne: benzaclin face gel BID.

## 2011-06-06 NOTE — Progress Notes (Signed)
S: patient presents to discuss the following: 1. Acne: on face and back. X 1 month. No face wash used. Reviewed past medical history, No acne prior to pregnancy.  2. Rash on stomach and lower breast also for 1 month. Ithcy and red. No home treatment.  3. Dandruff: flaky scalp. Washes hair daily. Uses pantene.   O:  BP 103/68  Temp 98.3 F (36.8 C)  Wt 136 lb (61.689 kg)  LMP 01/11/2011 General appearance: alert, cooperative and no distress Skin: raised acne with central pustules on bilateral malar. Hyperpigmented macules on upper back. Confluent slightly raised ertyhematous rash on upper stomach, below breast and between breast.   A/P 1. PUPP: treat mild topical steroid cream. Advised patient to get well fitting maternity bra. Informed patient that rash and itching will completely resolve in the postpartum period.  2. Acne: benzaclin face gel BID.  3. Dandruff: advised patient to wash hair every other day. And use moisturizing oil.  F/u as needed for persistent symptoms. Will refer to derm if symptoms are not greatly improved with the above treatment.

## 2011-06-06 NOTE — Assessment & Plan Note (Signed)
Dandruff: advised patient to wash hair every other day. And use moisturizing oil.

## 2011-06-16 ENCOUNTER — Ambulatory Visit (INDEPENDENT_AMBULATORY_CARE_PROVIDER_SITE_OTHER): Payer: Medicaid Other | Admitting: Obstetrics and Gynecology

## 2011-06-16 ENCOUNTER — Encounter: Payer: Self-pay | Admitting: Obstetrics and Gynecology

## 2011-06-16 VITALS — BP 98/58 | Ht 62.0 in | Wt 144.0 lb

## 2011-06-16 DIAGNOSIS — Z331 Pregnant state, incidental: Secondary | ICD-10-CM

## 2011-06-16 DIAGNOSIS — N898 Other specified noninflammatory disorders of vagina: Secondary | ICD-10-CM

## 2011-06-16 DIAGNOSIS — R109 Unspecified abdominal pain: Secondary | ICD-10-CM

## 2011-06-16 DIAGNOSIS — Z3490 Encounter for supervision of normal pregnancy, unspecified, unspecified trimester: Secondary | ICD-10-CM

## 2011-06-16 LAB — POCT OSOM BVBLUE TEST: Bacterial Vaginosis: NEGATIVE

## 2011-06-16 LAB — POCT OSOM TRICHOMONAS RAPID TEST: Trichomonas vaginalis: NEGATIVE

## 2011-06-16 LAB — POCT WET PREP (WET MOUNT)
Source Wet Prep POC: NEGATIVE
Trichomonas Wet Prep HPF POC: NEGATIVE
WBC, Wet Prep HPF POC: NEGATIVE
pH: 4.5

## 2011-06-16 NOTE — Patient Instructions (Signed)

## 2011-06-16 NOTE — Progress Notes (Signed)
C/o pains in lower abd and back. Also right leg cramping.

## 2011-06-16 NOTE — Progress Notes (Signed)
Complained of vaginal discharge; OSOM tich and BV, and  wet prep negative Complained of fatigue and dizziness; patient noted to be anemic and is to restart her prenatal vitamins now that her nausea and vomiting has resolved We'll recheck hemoglobin in 4 weeks at her one hour glucose visit

## 2011-06-20 ENCOUNTER — Telehealth: Payer: Self-pay | Admitting: Obstetrics and Gynecology

## 2011-06-20 NOTE — Telephone Encounter (Signed)
Telephone call to patient on 06/19/11 to explain that letter for sister-in-law to come to assist was not approved by Dr. Stefano Gaul.  Dr. Stefano Gaul only approved mother to come.  Patient can come and pick up a copy of letter.  Patient informed me that she has the letter.

## 2011-07-11 ENCOUNTER — Telehealth: Payer: Self-pay | Admitting: *Deleted

## 2011-07-11 ENCOUNTER — Other Ambulatory Visit: Payer: Self-pay | Admitting: *Deleted

## 2011-07-11 DIAGNOSIS — E781 Pure hyperglyceridemia: Secondary | ICD-10-CM

## 2011-07-11 MED ORDER — OMEGA-3-ACID ETHYL ESTERS 1 G PO CAPS: 1.0000 g | ORAL_CAPSULE | Freq: Every day | ORAL | Status: DC

## 2011-07-11 NOTE — Telephone Encounter (Signed)
PA required for Lovaza. Form given to Dr. Armen Pickup

## 2011-07-15 MED ORDER — OMEGA-3 FATTY ACIDS 1000 MG PO CAPS: 1.0000 g | ORAL_CAPSULE | Freq: Every day | ORAL | Status: DC

## 2011-07-15 NOTE — Telephone Encounter (Signed)
Patient called left VM. Generic fish oil prescribed.

## 2011-07-15 NOTE — Telephone Encounter (Signed)
Patient does not qualify for lovaza and insurance will no longer cover. Sent in generic fish oil instead. Patient advised.

## 2011-07-17 ENCOUNTER — Other Ambulatory Visit: Payer: Medicaid Other

## 2011-07-17 ENCOUNTER — Inpatient Hospital Stay (HOSPITAL_COMMUNITY)
Admission: AD | Admit: 2011-07-17 | Discharge: 2011-07-17 | Disposition: A | Discharge status: Home / Self Care | Payer: Medicaid Other | Source: Ambulatory Visit | Attending: Obstetrics and Gynecology | Admitting: Obstetrics and Gynecology

## 2011-07-17 ENCOUNTER — Ambulatory Visit (INDEPENDENT_AMBULATORY_CARE_PROVIDER_SITE_OTHER): Payer: Medicaid Other | Admitting: Obstetrics and Gynecology

## 2011-07-17 VITALS — BP 90/60 | Wt 142.0 lb

## 2011-07-17 DIAGNOSIS — R6251 Failure to thrive (child): Secondary | ICD-10-CM

## 2011-07-17 DIAGNOSIS — O9989 Other specified diseases and conditions complicating pregnancy, childbirth and the puerperium: Secondary | ICD-10-CM

## 2011-07-17 DIAGNOSIS — Z3490 Encounter for supervision of normal pregnancy, unspecified, unspecified trimester: Secondary | ICD-10-CM

## 2011-07-17 DIAGNOSIS — Z008 Encounter for other general examination: Secondary | ICD-10-CM | POA: Insufficient documentation

## 2011-07-17 DIAGNOSIS — Z331 Pregnant state, incidental: Secondary | ICD-10-CM

## 2011-07-17 LAB — GLUCOSE TOLERANCE, 1 HOUR (50G) W/O FASTING: Glucose, 1 Hour GTT: 102 mg/dL (ref 70–140)

## 2011-07-17 LAB — HEMOGLOBIN: Hemoglobin: 9.4 g/dL — ABNORMAL LOW (ref 12.0–15.0)

## 2011-07-17 MED ORDER — RHO D IMMUNE GLOBULIN 1500 UNIT/2ML IJ SOLN
300.0000 ug | Freq: Once | INTRAMUSCULAR | Status: AC
  Administered 2011-07-17: 300 ug via INTRAMUSCULAR
  Filled 2011-07-17: qty 2

## 2011-07-17 NOTE — Progress Notes (Signed)
Glucola, hemoglobin, RPR today. Complains of fatigue and nausea. Weight gain 3 pounds this pregnancy.  Ultrasound next visit for growth. Return to office in 3 weeks. Blood type A-.  Needs RhoGAM. Dr. Stefano Gaul

## 2011-07-17 NOTE — Addendum Note (Signed)
Addended by: Tim Lair on: 07/17/2011 10:56 AM   Modules accepted: Orders

## 2011-07-17 NOTE — MAU Note (Signed)
Rhophylac information sheet given to the patient and her husband in the lobby. Explained the 1 1/2 hours required to process this order,

## 2011-07-18 LAB — RH IG WORKUP (INCLUDES ABO/RH): Unit division: 0

## 2011-07-18 LAB — RPR

## 2011-07-28 ENCOUNTER — Inpatient Hospital Stay (HOSPITAL_COMMUNITY): Payer: Medicaid Other

## 2011-07-28 ENCOUNTER — Observation Stay (HOSPITAL_COMMUNITY)
Admission: AD | Admit: 2011-07-28 | Discharge: 2011-07-29 | Disposition: A | Discharge status: Home / Self Care | Payer: Medicaid Other | Source: Ambulatory Visit | Attending: Obstetrics and Gynecology | Admitting: Obstetrics and Gynecology

## 2011-07-28 DIAGNOSIS — O09899 Supervision of other high risk pregnancies, unspecified trimester: Secondary | ICD-10-CM

## 2011-07-28 DIAGNOSIS — M549 Dorsalgia, unspecified: Secondary | ICD-10-CM | POA: Insufficient documentation

## 2011-07-28 DIAGNOSIS — O99891 Other specified diseases and conditions complicating pregnancy: Principal | ICD-10-CM | POA: Insufficient documentation

## 2011-07-28 DIAGNOSIS — Z3400 Encounter for supervision of normal first pregnancy, unspecified trimester: Secondary | ICD-10-CM

## 2011-07-28 DIAGNOSIS — Y92009 Unspecified place in unspecified non-institutional (private) residence as the place of occurrence of the external cause: Secondary | ICD-10-CM | POA: Insufficient documentation

## 2011-07-28 DIAGNOSIS — O2610 Low weight gain in pregnancy, unspecified trimester: Secondary | ICD-10-CM

## 2011-07-28 DIAGNOSIS — T7411XA Adult physical abuse, confirmed, initial encounter: Secondary | ICD-10-CM

## 2011-07-28 DIAGNOSIS — T7492XA Unspecified child maltreatment, confirmed, initial encounter: Secondary | ICD-10-CM | POA: Insufficient documentation

## 2011-07-28 DIAGNOSIS — D649 Anemia, unspecified: Secondary | ICD-10-CM

## 2011-07-28 DIAGNOSIS — T7491XA Unspecified adult maltreatment, confirmed, initial encounter: Secondary | ICD-10-CM | POA: Insufficient documentation

## 2011-07-28 DIAGNOSIS — R109 Unspecified abdominal pain: Secondary | ICD-10-CM | POA: Insufficient documentation

## 2011-07-28 DIAGNOSIS — IMO0002 Reserved for concepts with insufficient information to code with codable children: Secondary | ICD-10-CM | POA: Diagnosis present

## 2011-07-28 DIAGNOSIS — M79609 Pain in unspecified limb: Secondary | ICD-10-CM | POA: Insufficient documentation

## 2011-07-28 LAB — WET PREP, GENITAL
Clue Cells Wet Prep HPF POC: NONE SEEN
Yeast Wet Prep HPF POC: NONE SEEN

## 2011-07-28 LAB — CBC WITH DIFFERENTIAL/PLATELET
Eosinophils Absolute: 0 10*3/uL (ref 0.0–0.7)
Eosinophils Relative: 0 % (ref 0–5)
HCT: 25.8 % — ABNORMAL LOW (ref 36.0–46.0)
Hemoglobin: 8.6 g/dL — ABNORMAL LOW (ref 12.0–15.0)
Lymphocytes Relative: 21 % (ref 12–46)
Lymphs Abs: 1.7 10*3/uL (ref 0.7–4.0)
MCH: 28.7 pg (ref 26.0–34.0)
MCV: 86 fL (ref 78.0–100.0)
Monocytes Absolute: 0.4 10*3/uL (ref 0.1–1.0)
Monocytes Relative: 4 % (ref 3–12)
Platelets: 165 10*3/uL (ref 150–400)
RBC: 3 MIL/uL — ABNORMAL LOW (ref 3.87–5.11)

## 2011-07-28 LAB — COMPREHENSIVE METABOLIC PANEL
BUN: 4 mg/dL — ABNORMAL LOW (ref 6–23)
CO2: 22 mEq/L (ref 19–32)
Calcium: 8.6 mg/dL (ref 8.4–10.5)
Creatinine, Ser: 0.36 mg/dL — ABNORMAL LOW (ref 0.50–1.10)
GFR calc Af Amer: >90 mL/min (ref >90)
GFR calc non Af Amer: >90 mL/min (ref >90)
Glucose, Bld: 95 mg/dL (ref 70–99)
Total Protein: 6.3 g/dL (ref 6.0–8.3)

## 2011-07-28 MED ORDER — ZOLPIDEM TARTRATE 5 MG PO TABS
5.0000 mg | ORAL_TABLET | Freq: Every evening | ORAL | Status: DC | PRN
  Administered 2011-07-29: 5 mg via ORAL
  Filled 2011-07-28 (×2): qty 1

## 2011-07-28 MED ORDER — SODIUM CHLORIDE 0.9 % IJ SOLN
INTRAMUSCULAR | Status: AC
  Filled 2011-07-28: qty 3

## 2011-07-28 MED ORDER — BUTORPHANOL TARTRATE 2 MG/ML IJ SOLN: 1.0000 mg | Freq: Two times a day (BID) | INTRAMUSCULAR | Status: DC | PRN

## 2011-07-28 MED ORDER — IBUPROFEN 600 MG PO TABS
600.0000 mg | ORAL_TABLET | Freq: Four times a day (QID) | ORAL | Status: DC | PRN
  Administered 2011-07-28 – 2011-07-29 (×2): 600 mg via ORAL
  Filled 2011-07-28 (×2): qty 1

## 2011-07-28 MED ORDER — HYDROCODONE-ACETAMINOPHEN 5-325 MG PO TABS
1.0000 | ORAL_TABLET | ORAL | Status: DC | PRN
  Administered 2011-07-28 – 2011-07-29 (×2): 1 via ORAL
  Administered 2011-07-29: 2 via ORAL
  Filled 2011-07-28 (×2): qty 1
  Filled 2011-07-28: qty 2
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
## 2011-07-29 ENCOUNTER — Encounter (HOSPITAL_COMMUNITY): Payer: Self-pay | Admitting: *Deleted

## 2011-07-29 DIAGNOSIS — D649 Anemia, unspecified: Secondary | ICD-10-CM

## 2011-07-29 DIAGNOSIS — IMO0002 Reserved for concepts with insufficient information to code with codable children: Secondary | ICD-10-CM | POA: Diagnosis present

## 2011-07-29 DIAGNOSIS — T7411XA Adult physical abuse, confirmed, initial encounter: Secondary | ICD-10-CM

## 2011-07-29 DIAGNOSIS — O09899 Supervision of other high risk pregnancies, unspecified trimester: Secondary | ICD-10-CM

## 2011-07-29 DIAGNOSIS — O2610 Low weight gain in pregnancy, unspecified trimester: Secondary | ICD-10-CM

## 2011-07-29 MED ORDER — CYCLOBENZAPRINE HCL 5 MG PO TABS: 5.0000 mg | ORAL_TABLET | Freq: Three times a day (TID) | ORAL | Status: AC | PRN

## 2011-07-29 NOTE — Progress Notes (Signed)
Pt Aunt to be here in about 30 Min for discharge.
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
## 2011-07-29 NOTE — Progress Notes (Signed)
UR Chart review completed.  
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
## 2011-08-04 ENCOUNTER — Ambulatory Visit (INDEPENDENT_AMBULATORY_CARE_PROVIDER_SITE_OTHER): Payer: Medicaid Other | Admitting: Obstetrics and Gynecology

## 2011-08-04 ENCOUNTER — Other Ambulatory Visit: Payer: Self-pay | Admitting: Obstetrics and Gynecology

## 2011-08-04 ENCOUNTER — Encounter: Payer: Self-pay | Admitting: Obstetrics and Gynecology

## 2011-08-04 ENCOUNTER — Ambulatory Visit (INDEPENDENT_AMBULATORY_CARE_PROVIDER_SITE_OTHER): Payer: Medicaid Other

## 2011-08-04 VITALS — BP 100/58 | Wt 146.0 lb

## 2011-08-04 DIAGNOSIS — Z3490 Encounter for supervision of normal pregnancy, unspecified, unspecified trimester: Secondary | ICD-10-CM

## 2011-08-04 DIAGNOSIS — R6251 Failure to thrive (child): Secondary | ICD-10-CM

## 2011-08-04 DIAGNOSIS — O9989 Other specified diseases and conditions complicating pregnancy, childbirth and the puerperium: Secondary | ICD-10-CM

## 2011-08-04 DIAGNOSIS — D649 Anemia, unspecified: Secondary | ICD-10-CM

## 2011-08-04 DIAGNOSIS — M549 Dorsalgia, unspecified: Secondary | ICD-10-CM

## 2011-08-04 DIAGNOSIS — O26899 Other specified pregnancy related conditions, unspecified trimester: Secondary | ICD-10-CM

## 2011-08-04 DIAGNOSIS — Z331 Pregnant state, incidental: Secondary | ICD-10-CM

## 2011-08-04 LAB — US OB FOLLOW UP

## 2011-08-04 MED ORDER — CYCLOBENZAPRINE HCL 10 MG PO TABS: 10.0000 mg | ORAL_TABLET | Freq: Three times a day (TID) | ORAL | Status: DC | PRN

## 2011-08-04 NOTE — Addendum Note (Signed)
Addended by: Earl Gala on: 08/04/2011 04:23 PM   Modules accepted: Orders

## 2011-08-04 NOTE — Progress Notes (Signed)
[redacted]w[redacted]d Growth USS today. USS: breech Presentation Posterior Placenta Normal Fluid: AFI = 45%tile Normal Linear Growth EFW = 57% tile No late presenting fetal abnormality seen. Cx Closed Normal adnexa ROB x 2 weeks.

## 2011-08-04 NOTE — Progress Notes (Signed)
Husband states pt c/o low back pain radiating down R leg 1 gtt 102 Hemoglobin 9.4 RPR NR

## 2011-08-05 MED ORDER — FERROUS SULFATE 325 (65 FE) MG PO TABS: 325.0000 mg | ORAL_TABLET | Freq: Every day | ORAL | Status: DC

## 2011-08-05 NOTE — Addendum Note (Signed)
Addended by: Earl Gala on: 08/05/2011 05:22 PM   Modules accepted: Orders

## 2011-08-06 ENCOUNTER — Telehealth: Payer: Self-pay

## 2011-08-06 NOTE — Telephone Encounter (Signed)
Message copied by Janeece Agee on Wed Aug 06, 2011  3:34 PM ------      Message from: Earl Gala      Created: Tue Aug 05, 2011  4:57 PM       Hi Ty,      Can you call this patient need to start on po Iron      Will order same.      Thanks,      Geraldine Contras.      ----- Message -----         From: Michael Litter, MD         Sent: 08/05/2011  12:13 AM           To: Earl Gala, CNM            Please give the patient supplemental iron.  I give 325 mg FESO4 BID      ----- Message -----         From: Earl Gala, CNM         Sent: 08/04/2011   5:47 PM           To: Michael Litter, MD

## 2011-08-06 NOTE — Telephone Encounter (Signed)
Spoke with pt's Aunt Nolberto Hanlon ok per HIPAA pt has a language barrier. Pt's iron level is low 9.4 and per DD iron tabs have been sent to [facility] [address]. Fe 325 mg take 1 tab po with breakfast 11 rf per DD. Pt's Aunt Micah Noel voices understanding and will inform pt.

## 2011-08-06 NOTE — Telephone Encounter (Signed)
A user error has taken place: encounter opened in error, closed for administrative reasons.

## 2011-08-14 ENCOUNTER — Observation Stay (HOSPITAL_COMMUNITY): Payer: Medicaid Other

## 2011-08-14 ENCOUNTER — Inpatient Hospital Stay (HOSPITAL_COMMUNITY)
Admission: AD | Admit: 2011-08-14 | Discharge: 2011-08-14 | Disposition: A | Discharge status: Home / Self Care | Payer: Medicaid Other | Source: Ambulatory Visit | Attending: Obstetrics and Gynecology | Admitting: Obstetrics and Gynecology

## 2011-08-14 ENCOUNTER — Encounter (HOSPITAL_COMMUNITY): Payer: Self-pay | Admitting: *Deleted

## 2011-08-14 DIAGNOSIS — O4690 Antepartum hemorrhage, unspecified, unspecified trimester: Secondary | ICD-10-CM | POA: Insufficient documentation

## 2011-08-14 DIAGNOSIS — D649 Anemia, unspecified: Secondary | ICD-10-CM

## 2011-08-14 DIAGNOSIS — O2610 Low weight gain in pregnancy, unspecified trimester: Secondary | ICD-10-CM

## 2011-08-14 DIAGNOSIS — O468X9 Other antepartum hemorrhage, unspecified trimester: Secondary | ICD-10-CM

## 2011-08-14 DIAGNOSIS — T7411XA Adult physical abuse, confirmed, initial encounter: Secondary | ICD-10-CM

## 2011-08-14 DIAGNOSIS — IMO0002 Reserved for concepts with insufficient information to code with codable children: Secondary | ICD-10-CM

## 2011-08-14 DIAGNOSIS — O4693 Antepartum hemorrhage, unspecified, third trimester: Secondary | ICD-10-CM

## 2011-08-14 DIAGNOSIS — O4700 False labor before 37 completed weeks of gestation, unspecified trimester: Secondary | ICD-10-CM | POA: Insufficient documentation

## 2011-08-14 DIAGNOSIS — M549 Dorsalgia, unspecified: Secondary | ICD-10-CM | POA: Insufficient documentation

## 2011-08-14 LAB — CBC
MCH: 27.8 pg (ref 26.0–34.0)
MCHC: 32.4 g/dL (ref 30.0–36.0)
Platelets: 189 10*3/uL (ref 150–400)
RDW: 13.9 % (ref 11.5–15.5)

## 2011-08-14 LAB — URINALYSIS, ROUTINE W REFLEX MICROSCOPIC
Bilirubin Urine: NEGATIVE
Specific Gravity, Urine: 1.01 (ref 1.005–1.030)
pH: 7.5 (ref 5.0–8.0)

## 2011-08-14 LAB — TYPE AND SCREEN: ABO/RH(D): A NEG

## 2011-08-14 LAB — URINE MICROSCOPIC-ADD ON

## 2011-08-14 MED ORDER — CYCLOBENZAPRINE HCL 10 MG PO TABS: 10.0000 mg | ORAL_TABLET | Freq: Once | ORAL | Status: DC

## 2011-08-14 MED ORDER — DOCUSATE SODIUM 100 MG PO CAPS
100.0000 mg | ORAL_CAPSULE | Freq: Every day | ORAL | Status: DC
  Filled 2011-08-14 (×2): qty 1

## 2011-08-14 MED ORDER — ACETAMINOPHEN 325 MG PO TABS
650.0000 mg | ORAL_TABLET | ORAL | Status: DC | PRN
  Administered 2011-08-14: 650 mg via ORAL
  Filled 2011-08-14: qty 2

## 2011-08-14 MED ORDER — LACTATED RINGERS IV BOLUS (SEPSIS)
500.0000 mL | Freq: Once | INTRAVENOUS | Status: AC
  Administered 2011-08-14: 17:00:00 via INTRAVENOUS

## 2011-08-14 MED ORDER — PRENATAL MULTIVITAMIN CH
1.0000 | ORAL_TABLET | Freq: Every day | ORAL | Status: DC
  Filled 2011-08-14 (×2): qty 1

## 2011-08-14 MED ORDER — ZOLPIDEM TARTRATE 5 MG PO TABS: 5.0000 mg | ORAL_TABLET | Freq: Every evening | ORAL | Status: DC | PRN

## 2011-08-14 MED ORDER — RHO D IMMUNE GLOBULIN 1500 UNIT/2ML IJ SOLN
300.0000 ug | Freq: Once | INTRAMUSCULAR | Status: AC
  Administered 2011-08-14: 300 ug via INTRAMUSCULAR

## 2011-08-14 MED ORDER — CALCIUM CARBONATE ANTACID 500 MG PO CHEW
2.0000 | CHEWABLE_TABLET | ORAL | Status: DC | PRN
  Filled 2011-08-14: qty 2

## 2011-08-14 MED ORDER — CYCLOBENZAPRINE HCL 10 MG PO TABS
10.0000 mg | ORAL_TABLET | Freq: Once | ORAL | Status: AC
  Administered 2011-08-14: 10 mg via ORAL
  Filled 2011-08-14: qty 1

## 2011-08-14 MED ORDER — NIFEDIPINE 10 MG PO CAPS
10.0000 mg | ORAL_CAPSULE | Freq: Once | ORAL | Status: AC
  Administered 2011-08-14: 10 mg via ORAL
  Filled 2011-08-14: qty 1

## 2011-08-14 NOTE — H&P (Signed)
[name] [name] is a 26 y.o. female presenting for hx of PV Bleeding this afternoon. Family described large pool on floor and running down legs. On admission. Minimal bleeding noted PV. History OB History    Grav Para Term Preterm Abortions TAB SAB Ect Mult Living   1 0 0 0 0 0 0 0 0 0      Past Medical History  Diagnosis Date  . Anemia 2009  . History of gastroesophageal reflux (GERD) 2010    taking with tums, helps   . Hypotension   . Sleep apnea 2012  . Migraines 2009  . Exposure to x-rays 2012    normal  . Back pain 2010  . H/O varicella   . H/O candidiasis   . Anxiety    Past Surgical History  Procedure Date  . No past surgeries   . Colposcopy 04/02/2011   Family History: family history includes Asthma in her father and mother; Cancer in her maternal grandmother and unspecified family member; Diabetes in an unspecified family member; Heart disease in her maternal grandmother and unspecified family member; Hyperlipidemia in an unspecified family member; Hypertension in her maternal aunt, maternal grandfather, maternal grandmother, and unspecified family member; Stroke in an unspecified family member; and Thyroid disease in an unspecified family member.  There is no history of Anesthesia problems. Social History:  reports that she has never smoked. She has never used smokeless tobacco. She reports that she does not drink alcohol or use illicit drugs.   Prenatal Transfer Tool  Maternal Diabetes: No Genetic Screening: Normal Maternal Ultrasounds/Referrals: Normal Fetal Ultrasounds or other Referrals:  None Maternal Substance Abuse:  No Significant Maternal Medications:  None Significant Maternal Lab Results:  None Other Comments:  patient had growth USS 3 weeks ago for linear growth- normal.  ROS    Blood pressure 133/73, pulse 102, temperature 98.3 F (36.8 C), temperature source Oral, resp. rate 22, last menstrual period 01/11/2011. Exam Physical Exam   Affect:  crying Lungs: Bilat Clear CV: RRR Abdomen: Tender to touch but not firm. Remains soft. GI: Normal GU: small; amount of PV Bleeding on admission not c/w Hx given. Speculum examination to follow s/p USS (Bedside) FHT's 135 bpm, UC 1 : 2 mins, moderate USS formal to bedside - informal USS result- no abruption seen.  Prenatal labs: ABO, Rh: --/--/A NEG (07/11 1248) Antibody: NEG (07/11 1248) Rubella: 91.0 (02/13 1223) RPR: NON REAC (07/11 1102)  HBsAg: NEGATIVE (02/13 1223)  HIV: NON REACTIVE (02/13 1223)  GBS:   unknown  Assessment/Plan: Observation MAU Labs: CBC, CMP, Coag Panel, Type and Screen. USS : Ob to bedside. IV access and Bolus IV fluids U/A   Augustine Brannick 08/14/2011, 2:43 PM

## 2011-08-14 NOTE — MAU Note (Signed)
C/o pain- swelling in rt arm- no swelling, no redness at IV site.  Pt holding hand in fixed position.  Instructed pt that she can move hand.  Asked for ice for hand..The patient is advised to apply ice or cold packs intermittently as needed to relieve pain. Ice pack applied to palm

## 2011-08-14 NOTE — Progress Notes (Signed)
Patient presented to MAU with Vaginal Bleeding x 1 hr ago while showering S: Affect calm now O: Speculum Examination:     Small amount of blood seen at introitus     Speculum Examination:      Vaginal walls: normal     Cx: normal     Small blood clot in the posterior fornix - removed. No active bleeding noted.     Abdomen soft.    IV Hydration has helped with uc's and the uc's have spaced 1: 3 -4 mins    U/a to lab for Culture.    C/o Left sided Sacro-iliac pain which she had for past 3 weeks and has been tx'ed with Flexeril 10mg s po q 8hrly prn A: Cervititis c/w BV with possible UTI P: Tx Flagyl 500 mg po BID x 7 days on discharge.     To continue EFM for uterine activity surveillance.  Earl Gala, CNM.
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
## 2011-08-14 NOTE — MAU Note (Signed)
[place]- Verbal report , no sign of abruption; AFI 13, cervical length 4cm, +FM

## 2011-08-14 NOTE — MAU Note (Signed)
Using hand held shower and started bleeding. Back pain started after bleeding.

## 2011-08-14 NOTE — MAU Note (Signed)
Pt calmer now, is able to talk through ctx's.

## 2011-08-15 LAB — RH IG WORKUP (INCLUDES ABO/RH)
ABO/RH(D): A NEG
Fetal Screen: NEGATIVE
Gestational Age(Wks): 30.5
Unit division: 0

## 2011-08-18 ENCOUNTER — Ambulatory Visit (INDEPENDENT_AMBULATORY_CARE_PROVIDER_SITE_OTHER): Payer: Medicaid Other | Admitting: Obstetrics and Gynecology

## 2011-08-18 VITALS — BP 110/62 | Wt 148.0 lb

## 2011-08-18 DIAGNOSIS — Z331 Pregnant state, incidental: Secondary | ICD-10-CM

## 2011-08-18 NOTE — Progress Notes (Signed)
Going well. Return office in 2 weeks. [redacted]w[redacted]d  Dr. Stefano Gaul

## 2011-09-03 ENCOUNTER — Ambulatory Visit (INDEPENDENT_AMBULATORY_CARE_PROVIDER_SITE_OTHER): Payer: Medicaid Other | Admitting: Obstetrics and Gynecology

## 2011-09-03 VITALS — BP 110/60 | Wt 150.0 lb

## 2011-09-03 DIAGNOSIS — Z331 Pregnant state, incidental: Secondary | ICD-10-CM

## 2011-09-03 NOTE — Progress Notes (Signed)
Pt states she has no concerns today. Pt states she needs a dental note and needs the note to specify how far along she is. Pt states she feels baby move everyday , just not as much as she use to.

## 2011-09-03 NOTE — Progress Notes (Addendum)
[redacted]w[redacted]d Baby is active, but does not move as much as it used to. Complains of insomnia.  Strategies for rest reviewed. Note to see dentist given. Return to office in 2 weeks. Dr. Stefano Gaul

## 2011-09-11 ENCOUNTER — Telehealth: Payer: Self-pay | Admitting: Obstetrics and Gynecology

## 2011-09-11 NOTE — Telephone Encounter (Signed)
Pt's aunt, Ms. Godfrey Pick, called acting as pt interpreter since pt is unable to speak English. Ms. Godfrey Pick states that pt has been experiencing cramping, lower back pain, and some leakage of fluid. The pt has been feeling like she has to vomit but nothing comes up. Pt has been having contractions every 10-15 min since this morning and experiencing shortness of breath. Pt also c/o decreased fetal movement. Pt advised to go to MAU for evaluation and pt's aunt voiced understanding.

## 2011-09-17 ENCOUNTER — Ambulatory Visit (INDEPENDENT_AMBULATORY_CARE_PROVIDER_SITE_OTHER): Payer: Medicaid Other | Admitting: Obstetrics and Gynecology

## 2011-09-17 VITALS — BP 120/72 | Wt 151.0 lb

## 2011-09-17 DIAGNOSIS — Z3400 Encounter for supervision of normal first pregnancy, unspecified trimester: Secondary | ICD-10-CM

## 2011-09-17 NOTE — Progress Notes (Signed)
[redacted]w[redacted]d No complaints FKCs GBS at NV Questions answered May try benadryl to help her sleep

## 2011-09-23 ENCOUNTER — Encounter: Payer: Medicaid Other | Admitting: Obstetrics and Gynecology

## 2011-09-25 ENCOUNTER — Telehealth: Payer: Self-pay | Admitting: Obstetrics and Gynecology

## 2011-09-26 ENCOUNTER — Ambulatory Visit (INDEPENDENT_AMBULATORY_CARE_PROVIDER_SITE_OTHER): Payer: Medicaid Other | Admitting: Obstetrics and Gynecology

## 2011-09-26 ENCOUNTER — Encounter: Payer: Self-pay | Admitting: Obstetrics and Gynecology

## 2011-09-26 VITALS — BP 112/80 | Wt 152.0 lb

## 2011-09-26 DIAGNOSIS — N949 Unspecified condition associated with female genital organs and menstrual cycle: Secondary | ICD-10-CM

## 2011-09-26 DIAGNOSIS — O26899 Other specified pregnancy related conditions, unspecified trimester: Secondary | ICD-10-CM

## 2011-09-26 DIAGNOSIS — Z3400 Encounter for supervision of normal first pregnancy, unspecified trimester: Secondary | ICD-10-CM

## 2011-09-26 DIAGNOSIS — O9989 Other specified diseases and conditions complicating pregnancy, childbirth and the puerperium: Secondary | ICD-10-CM

## 2011-09-26 LAB — POCT URINALYSIS DIPSTICK
Glucose, UA: NEGATIVE
Ketones, UA: NEGATIVE
Leukocytes, UA: NEGATIVE
Spec Grav, UA: 1.01
Urobilinogen, UA: NEGATIVE

## 2011-09-26 NOTE — Progress Notes (Signed)
[redacted]w[redacted]d GBS/GC/CT today Results for orders placed in visit on 09/26/11  POCT URINALYSIS DIPSTICK      Component Value Range   Color, UA yellow     Clarity, UA clr     Glucose, UA neg     Bilirubin, UA neg     Ketones, UA neg     Spec Grav, UA 1.010     Blood, UA neg     pH, UA 5.0     Protein, UA neg     Urobilinogen, UA negative     Nitrite, UA neg     Leukocytes, UA Negative     FKCs and Labor precautions RTO 1wk U/s at NV to check presentation

## 2011-09-27 LAB — GC/CHLAMYDIA PROBE AMP, GENITAL
Chlamydia, DNA Probe: NEGATIVE
GC Probe Amp, Genital: NEGATIVE

## 2011-10-01 ENCOUNTER — Ambulatory Visit: Payer: Medicaid Other

## 2011-10-01 ENCOUNTER — Ambulatory Visit (INDEPENDENT_AMBULATORY_CARE_PROVIDER_SITE_OTHER): Payer: Medicaid Other | Admitting: Obstetrics and Gynecology

## 2011-10-01 VITALS — BP 122/64 | Wt 152.0 lb

## 2011-10-01 DIAGNOSIS — Z331 Pregnant state, incidental: Secondary | ICD-10-CM

## 2011-10-01 DIAGNOSIS — Z3400 Encounter for supervision of normal first pregnancy, unspecified trimester: Secondary | ICD-10-CM

## 2011-10-01 NOTE — Progress Notes (Signed)
[redacted]w[redacted]d Gonorrhea negative, Chlamydia negative, beta strep negative. Return office in 1 week. Ultrasound: Single gestation, vertex, normal fluid. Dr. Stefano Gaul

## 2011-10-01 NOTE — Progress Notes (Signed)
[redacted]w[redacted]d  U/S  Vertex presentation. Posterior Placenta Normal Fluid :AFI = 45th% tile CX Not measured.

## 2011-10-06 LAB — US OB LIMITED

## 2011-10-08 ENCOUNTER — Encounter: Payer: Self-pay | Admitting: Obstetrics and Gynecology

## 2011-10-08 ENCOUNTER — Ambulatory Visit (INDEPENDENT_AMBULATORY_CARE_PROVIDER_SITE_OTHER): Payer: Medicaid Other | Admitting: Obstetrics and Gynecology

## 2011-10-08 VITALS — BP 110/78 | Wt 153.0 lb

## 2011-10-08 DIAGNOSIS — Z331 Pregnant state, incidental: Secondary | ICD-10-CM

## 2011-10-08 NOTE — Progress Notes (Signed)
Pt c/o thick white discharge, pt is having lower abdominal pain. Pt requests cervix check

## 2011-10-08 NOTE — Progress Notes (Signed)
[redacted]w[redacted]d Doing well.  Complains of pressure. Active baby. Mother coming to [facility]. Return to office in 1 week. Dr. Stefano Gaul

## 2011-10-09 ENCOUNTER — Inpatient Hospital Stay (HOSPITAL_COMMUNITY)
Admission: AD | Admit: 2011-10-09 | Discharge: 2011-10-09 | Disposition: A | Discharge status: Home / Self Care | Payer: Medicaid Other | Source: Ambulatory Visit | Attending: Obstetrics and Gynecology | Admitting: Obstetrics and Gynecology

## 2011-10-09 ENCOUNTER — Encounter (HOSPITAL_COMMUNITY): Payer: Self-pay | Admitting: *Deleted

## 2011-10-09 DIAGNOSIS — O479 False labor, unspecified: Secondary | ICD-10-CM | POA: Insufficient documentation

## 2011-10-09 MED ORDER — ZOLPIDEM TARTRATE 5 MG PO TABS
5.0000 mg | ORAL_TABLET | Freq: Once | ORAL | Status: AC
  Administered 2011-10-09: 5 mg via ORAL
  Filled 2011-10-09: qty 1
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
## 2011-10-09 NOTE — MAU Note (Signed)
Contraction since 2pmevery 5 mins

## 2011-10-11 ENCOUNTER — Encounter (HOSPITAL_COMMUNITY): Payer: Self-pay | Admitting: *Deleted

## 2011-10-11 ENCOUNTER — Inpatient Hospital Stay (HOSPITAL_COMMUNITY)
Admission: AD | Admit: 2011-10-11 | Discharge: 2011-10-11 | Disposition: A | Discharge status: Home / Self Care | Payer: Medicaid Other | Source: Ambulatory Visit | Attending: Obstetrics and Gynecology | Admitting: Obstetrics and Gynecology

## 2011-10-11 DIAGNOSIS — O479 False labor, unspecified: Secondary | ICD-10-CM

## 2011-10-11 MED ORDER — ZOLPIDEM TARTRATE ER 12.5 MG PO TBCR: 12.5000 mg | EXTENDED_RELEASE_TABLET | Freq: Every evening | ORAL | Status: DC | PRN

## 2011-10-11 NOTE — MAU Provider Note (Signed)
History    [name] called with history of contractions. To MAU for evaluation of labor.  CSN: [id number]  Arrival date and time: 10/11/11 1814   None     Chief Complaint  Patient presents with  . Labor Eval   HPI  OB History    Grav Para Term Preterm Abortions TAB SAB Ect Mult Living   1 0 0 0 0 0 0 0 0 0       Past Medical History  Diagnosis Date  . Anemia 2009  . History of gastroesophageal reflux (GERD) 2010    taking with tums, helps   . Hypotension   . Sleep apnea 2012  . Migraines 2009  . Exposure to x-rays 2012    normal  . Back pain 2010  . H/O varicella   . H/O candidiasis   . Anxiety     Past Surgical History  Procedure Date  . No past surgeries   . Colposcopy 04/02/2011    Family History  Problem Relation Age of Onset  . Asthma Mother   . Asthma Father   . Cancer    . Heart disease    . Stroke    . Thyroid disease    . Diabetes    . Hypertension    . Hyperlipidemia    . Anesthesia problems Neg Hx   . Hypertension Maternal Aunt   . Heart disease Maternal Grandmother   . Hypertension Maternal Grandmother   . Cancer Maternal Grandmother   . Hypertension Maternal Grandfather     History  Substance Use Topics  . Smoking status: Never Smoker   . Smokeless tobacco: Never Used  . Alcohol Use: No    Allergies:  Allergies  Allergen Reactions  . Other Anxiety and Rash    Medication for sleep given while inpatient; doesn't remember name, no matching medications listed on chart history.  Possibly ambien, lunesta, benadryl, doxylamine.    Prescriptions prior to admission  Medication Sig Dispense Refill  . acetaminophen (TYLENOL) 500 MG tablet Take 500 mg by mouth every 6 (six) hours as needed. For pain      . cyclobenzaprine (FLEXERIL) 10 MG tablet Take 1 tablet (10 mg total) by mouth every 8 (eight) hours as needed for muscle spasms.  30 tablet  2  . docusate sodium (COLACE) 100 MG capsule Take 100 mg by mouth 2 (two) times daily.      .  ferrous sulfate 325 (65 FE) MG tablet Take 1 tablet (325 mg total) by mouth daily with breakfast.  30 tablet  11  . omeprazole (PRILOSEC) 40 MG capsule       . Prenatal Vit-Fe Fumarate-FA (PRENATAL MULTIVITAMIN) TABS Take 1 tablet by mouth daily.      Marland Kitchen PRESCRIPTION MEDICATION Apply 1 application topically daily. Pt states she is using a cream for her stomach by Rx, doesn't know name of medication, pharmacy closed at time.      Marland Kitchen PRESCRIPTION MEDICATION Apply 1 application topically daily. Pt states she uses a prescription cream on her face, does not know medication name, pharmacy closed at time.      . promethazine (PHENERGAN) 25 MG tablet Take 0.5 tablets (12.5 mg total) by mouth every 6 (six) hours as needed for nausea.  20 tablet  0    Review of Systems  Constitutional: Negative.   HENT: Negative.   Eyes: Negative.   Respiratory: Negative.   Cardiovascular: Negative.   Gastrointestinal: Negative.   Genitourinary: Negative.  Musculoskeletal: Negative.   Skin: Negative.   Neurological: Negative.   Endo/Heme/Allergies: Negative.   Psychiatric/Behavioral: Negative.    Physical Exam   Blood pressure 117/83, pulse 102, temperature 98.2 F (36.8 C), temperature source Oral, resp. rate 18, last menstrual period 01/11/2011.  Physical Exam  Constitutional: She is oriented to person, place, and time. She appears well-developed and well-nourished. She appears distressed.  HENT:  Head: Normocephalic and atraumatic.  Eyes: Conjunctivae normal and EOM are normal. Pupils are equal, round, and reactive to light.  Neck: Normal range of motion. Neck supple.  Cardiovascular: Normal rate, regular rhythm and normal heart sounds.   Respiratory: Effort normal and breath sounds normal.  GI: Soft. Bowel sounds are normal.  Genitourinary:       Uterine contractions at irregular intervals Last vaginal examination 2 nights ago  Cx 1/70%/-2  Musculoskeletal: Normal range of motion.  Neurological: She  is alert and oriented to person, place, and time. She has normal reflexes.  Skin: Skin is warm and dry.  Psychiatric: She has a normal mood and affect.    MAU Course  Procedures  NST: basline 130 bpm reactive tracing Cat 1 Tracing SVE: 1/70%/-2  Assessment and Plan  Braxton hicks contractions No signs of labor F/U [facility] 10/14/11 as scheduled. Ambien 12.5 mgs po QHS - prescription given.  [name] [name], CNM. 10/11/2011, 6:57 PM

## 2011-10-11 NOTE — MAU Note (Signed)
Contractions for the last 3 hours every 4-5 mins

## 2011-10-13 ENCOUNTER — Telehealth: Payer: Self-pay | Admitting: Obstetrics and Gynecology

## 2011-10-13 NOTE — Telephone Encounter (Signed)
TC from translator states pt is having very intense contractions every 30 mins & bleeding but has not broke her water. Translator states pt has changed 3 pads since this am not soaked just spotting. Informed pt will consult the CNM on call and call her back.  Consulted with MK. MK will call the pt.

## 2011-10-13 NOTE — Telephone Encounter (Signed)
Reviewed s/s uc, srom, vag bleeding, daily fetal kick counts to report, comfort measures. Ice, tylenol, heat, bath, positioning for backache. Encouragged 8 water daily and frequent voids. Lavera Guise, CNM

## 2011-10-14 ENCOUNTER — Encounter (HOSPITAL_COMMUNITY): Payer: Self-pay | Admitting: Obstetrics and Gynecology

## 2011-10-14 ENCOUNTER — Encounter (HOSPITAL_COMMUNITY): Payer: Self-pay | Admitting: *Deleted

## 2011-10-14 ENCOUNTER — Encounter (HOSPITAL_COMMUNITY): Payer: Self-pay | Admitting: Anesthesiology

## 2011-10-14 ENCOUNTER — Inpatient Hospital Stay (HOSPITAL_COMMUNITY): Payer: Medicaid Other | Admitting: Anesthesiology

## 2011-10-14 ENCOUNTER — Inpatient Hospital Stay (HOSPITAL_COMMUNITY)
Admission: AD | Admit: 2011-10-14 | Discharge: 2011-10-16 | DRG: 775 | Disposition: A | Discharge status: Home / Self Care | Payer: Medicaid Other | Source: Ambulatory Visit | Attending: Obstetrics and Gynecology | Admitting: Obstetrics and Gynecology

## 2011-10-14 DIAGNOSIS — O2610 Low weight gain in pregnancy, unspecified trimester: Secondary | ICD-10-CM

## 2011-10-14 DIAGNOSIS — D649 Anemia, unspecified: Secondary | ICD-10-CM | POA: Diagnosis present

## 2011-10-14 DIAGNOSIS — O9902 Anemia complicating childbirth: Secondary | ICD-10-CM | POA: Diagnosis present

## 2011-10-14 DIAGNOSIS — IMO0002 Reserved for concepts with insufficient information to code with codable children: Secondary | ICD-10-CM

## 2011-10-14 LAB — CBC
HCT: 33.4 % — ABNORMAL LOW (ref 36.0–46.0)
Hemoglobin: 10.9 g/dL — ABNORMAL LOW (ref 12.0–15.0)
MCH: 27 pg (ref 26.0–34.0)
MCHC: 32.6 g/dL (ref 30.0–36.0)
MCV: 82.9 fL (ref 78.0–100.0)
RBC: 4.03 MIL/uL (ref 3.87–5.11)

## 2011-10-14 MED ORDER — PRENATAL MULTIVITAMIN CH
1.0000 | ORAL_TABLET | Freq: Every day | ORAL | Status: DC
  Administered 2011-10-15 – 2011-10-16 (×2): 1 via ORAL
  Filled 2011-10-14 (×2): qty 1

## 2011-10-14 MED ORDER — PHENYLEPHRINE 40 MCG/ML (10ML) SYRINGE FOR IV PUSH (FOR BLOOD PRESSURE SUPPORT): 80.0000 ug | PREFILLED_SYRINGE | INTRAVENOUS | Status: DC | PRN

## 2011-10-14 MED ORDER — CITRIC ACID-SODIUM CITRATE 334-500 MG/5ML PO SOLN: 30.0000 mL | ORAL | Status: DC | PRN

## 2011-10-14 MED ORDER — LACTATED RINGERS IV SOLN
INTRAVENOUS | Status: DC
  Administered 2011-10-14: 10:00:00 via INTRAVENOUS

## 2011-10-14 MED ORDER — SIMETHICONE 80 MG PO CHEW: 80.0000 mg | CHEWABLE_TABLET | ORAL | Status: DC | PRN

## 2011-10-14 MED ORDER — PHENYLEPHRINE 40 MCG/ML (10ML) SYRINGE FOR IV PUSH (FOR BLOOD PRESSURE SUPPORT)
80.0000 ug | PREFILLED_SYRINGE | INTRAVENOUS | Status: DC | PRN
  Filled 2011-10-14: qty 5

## 2011-10-14 MED ORDER — OXYTOCIN BOLUS FROM INFUSION
500.0000 mL | Freq: Once | INTRAVENOUS | Status: AC
  Administered 2011-10-14: 500 mL via INTRAVENOUS
  Filled 2011-10-14: qty 500

## 2011-10-14 MED ORDER — OXYTOCIN 40 UNITS IN LACTATED RINGERS INFUSION - SIMPLE MED
62.5000 mL/h | Freq: Once | INTRAVENOUS | Status: AC
  Administered 2011-10-14: 62.5 mL/h via INTRAVENOUS
  Filled 2011-10-14: qty 1000

## 2011-10-14 MED ORDER — DIBUCAINE 1 % RE OINT: 1.0000 "application " | TOPICAL_OINTMENT | RECTAL | Status: DC | PRN

## 2011-10-14 MED ORDER — LANOLIN HYDROUS EX OINT: TOPICAL_OINTMENT | CUTANEOUS | Status: DC | PRN

## 2011-10-14 MED ORDER — EPHEDRINE 5 MG/ML INJ
10.0000 mg | INTRAVENOUS | Status: DC | PRN
  Filled 2011-10-14: qty 4

## 2011-10-14 MED ORDER — LACTATED RINGERS IV SOLN: 500.0000 mL | INTRAVENOUS | Status: DC | PRN

## 2011-10-14 MED ORDER — IBUPROFEN 600 MG PO TABS: 600.0000 mg | ORAL_TABLET | Freq: Four times a day (QID) | ORAL | Status: DC | PRN

## 2011-10-14 MED ORDER — ONDANSETRON HCL 4 MG/2ML IJ SOLN: 4.0000 mg | INTRAMUSCULAR | Status: DC | PRN

## 2011-10-14 MED ORDER — LACTATED RINGERS IV BOLUS (SEPSIS): 500.0000 mL | Freq: Once | INTRAVENOUS | Status: DC

## 2011-10-14 MED ORDER — DIPHENHYDRAMINE HCL 50 MG/ML IJ SOLN: 12.5000 mg | INTRAMUSCULAR | Status: DC | PRN

## 2011-10-14 MED ORDER — ZOLPIDEM TARTRATE 5 MG PO TABS: 5.0000 mg | ORAL_TABLET | Freq: Every evening | ORAL | Status: DC | PRN

## 2011-10-14 MED ORDER — BENZOCAINE-MENTHOL 20-0.5 % EX AERO
1.0000 "application " | INHALATION_SPRAY | CUTANEOUS | Status: DC | PRN
  Administered 2011-10-14: 1 via TOPICAL
  Filled 2011-10-14: qty 56

## 2011-10-14 MED ORDER — ONDANSETRON HCL 4 MG/2ML IJ SOLN
4.0000 mg | Freq: Four times a day (QID) | INTRAMUSCULAR | Status: DC | PRN
  Administered 2011-10-14 (×2): 4 mg via INTRAVENOUS
  Filled 2011-10-14 (×2): qty 2

## 2011-10-14 MED ORDER — LIDOCAINE HCL (PF) 1 % IJ SOLN
INTRAMUSCULAR | Status: DC | PRN
  Administered 2011-10-14 (×2): 7 mL

## 2011-10-14 MED ORDER — OXYCODONE-ACETAMINOPHEN 5-325 MG PO TABS
1.0000 | ORAL_TABLET | ORAL | Status: DC | PRN
  Administered 2011-10-14 – 2011-10-15 (×4): 1 via ORAL
  Filled 2011-10-14 (×4): qty 1

## 2011-10-14 MED ORDER — LACTATED RINGERS IV SOLN: 500.0000 mL | Freq: Once | INTRAVENOUS | Status: DC

## 2011-10-14 MED ORDER — WITCH HAZEL-GLYCERIN EX PADS: 1.0000 "application " | MEDICATED_PAD | CUTANEOUS | Status: DC | PRN

## 2011-10-14 MED ORDER — FENTANYL 2.5 MCG/ML BUPIVACAINE 1/10 % EPIDURAL INFUSION (WH - ANES)
14.0000 mL/h | INTRAMUSCULAR | Status: DC
  Filled 2011-10-14: qty 125

## 2011-10-14 MED ORDER — SENNOSIDES-DOCUSATE SODIUM 8.6-50 MG PO TABS
2.0000 | ORAL_TABLET | Freq: Every day | ORAL | Status: DC
  Administered 2011-10-14 – 2011-10-15 (×2): 2 via ORAL

## 2011-10-14 MED ORDER — ACETAMINOPHEN 325 MG PO TABS: 650.0000 mg | ORAL_TABLET | ORAL | Status: DC | PRN

## 2011-10-14 MED ORDER — OXYCODONE-ACETAMINOPHEN 5-325 MG PO TABS: 1.0000 | ORAL_TABLET | ORAL | Status: DC | PRN

## 2011-10-14 MED ORDER — LIDOCAINE HCL (PF) 1 % IJ SOLN
30.0000 mL | INTRAMUSCULAR | Status: DC | PRN
  Filled 2011-10-14: qty 30

## 2011-10-14 MED ORDER — IBUPROFEN 600 MG PO TABS
600.0000 mg | ORAL_TABLET | Freq: Four times a day (QID) | ORAL | Status: DC
  Administered 2011-10-14 – 2011-10-16 (×8): 600 mg via ORAL
  Filled 2011-10-14 (×8): qty 1

## 2011-10-14 MED ORDER — FENTANYL 2.5 MCG/ML BUPIVACAINE 1/10 % EPIDURAL INFUSION (WH - ANES)
INTRAMUSCULAR | Status: DC | PRN
  Administered 2011-10-14: 14 mL/h via EPIDURAL

## 2011-10-14 MED ORDER — DIPHENHYDRAMINE HCL 25 MG PO CAPS: 25.0000 mg | ORAL_CAPSULE | Freq: Four times a day (QID) | ORAL | Status: DC | PRN

## 2011-10-14 MED ORDER — EPHEDRINE 5 MG/ML INJ: 10.0000 mg | INTRAVENOUS | Status: DC | PRN

## 2011-10-14 MED ORDER — ONDANSETRON HCL 4 MG PO TABS: 4.0000 mg | ORAL_TABLET | ORAL | Status: DC | PRN

## 2011-10-14 MED ORDER — TETANUS-DIPHTH-ACELL PERTUSSIS 5-2.5-18.5 LF-MCG/0.5 IM SUSP
0.5000 mL | Freq: Once | INTRAMUSCULAR | Status: AC
  Administered 2011-10-15: 0.5 mL via INTRAMUSCULAR

## 2011-10-14 NOTE — Progress Notes (Signed)
Dr. Normand Sloop updated on admission as last progress note, continue care. Lavera Guise, CNM

## 2011-10-14 NOTE — Anesthesia Procedure Notes (Signed)
Epidural Patient location during procedure: OB Start time: 10/14/2011 8:55 AM End time: 10/14/2011 8:59 AM  Staffing Anesthesiologist: Sandrea Hughs Performed by: anesthesiologist   Preanesthetic Checklist Completed: patient identified, site marked, surgical consent, pre-op evaluation, timeout performed, IV checked, risks and benefits discussed and monitors and equipment checked  Epidural Patient position: sitting Prep: site prepped and draped and DuraPrep Patient monitoring: continuous pulse ox and blood pressure Approach: midline Injection technique: LOR air  Needle:  Needle type: Tuohy  Needle gauge: 17 G Needle length: 9 cm and 9 Needle insertion depth: 5 cm cm Catheter type: closed end flexible Catheter size: 19 Gauge Catheter at skin depth: 10 cm Test dose: negative and Other  Assessment Sensory level: T8 Events: blood not aspirated, injection not painful, no injection resistance, negative IV test and no paresthesia  Additional Notes Reason for block:procedure for pain

## 2011-10-14 NOTE — MAU Provider Note (Signed)
History    Ms Ruberg  arrived at MAU without calling complaining of regular contractions. For evaluation of contractions for labor. There is a language barrier as the patient is "Arabic" speaking.  He husband translates for her. The patient understands plenty of English but has difficulty in answering.The patient had been in MAU 10/13/11 for evaluation of contractions for labor. SVE at the time  Cx: 1/70%/-2. BH contractions and no signs of labor. The patient was given a prescription for Ambien 12.5 mgs po QHS PRN. To aid sleep.  CSN: [id number]  Arrival date and time: 10/14/11 4098   None     Chief Complaint  Patient presents with  . Contractions   HPI  OB History    Grav Para Term Preterm Abortions TAB SAB Ect Mult Living   1 0 0 0 0 0 0 0 0 0       Past Medical History  Diagnosis Date  . Anemia 2009  . History of gastroesophageal reflux (GERD) 2010    taking with tums, helps   . Hypotension   . Sleep apnea 2012  . Migraines 2009  . Exposure to x-rays 2012    normal  . Back pain 2010  . H/O varicella   . H/O candidiasis   . Anxiety     Past Surgical History  Procedure Date  . No past surgeries   . Colposcopy 04/02/2011    Family History  Problem Relation Age of Onset  . Asthma Mother   . Asthma Father   . Cancer    . Heart disease    . Stroke    . Thyroid disease    . Diabetes    . Hypertension    . Hyperlipidemia    . Anesthesia problems Neg Hx   . Hypertension Maternal Aunt   . Heart disease Maternal Grandmother   . Hypertension Maternal Grandmother   . Cancer Maternal Grandmother   . Hypertension Maternal Grandfather     History  Substance Use Topics  . Smoking status: Never Smoker   . Smokeless tobacco: Never Used  . Alcohol Use: No    Allergies:  Allergies  Allergen Reactions  . Other Anxiety and Rash    Medication for sleep given while inpatient; doesn't remember name, no matching medications listed on chart history.  Possibly ambien,  lunesta, benadryl, doxylamine.    Prescriptions prior to admission  Medication Sig Dispense Refill  . acetaminophen (TYLENOL) 500 MG tablet Take 500 mg by mouth every 6 (six) hours as needed. For pain      . cyclobenzaprine (FLEXERIL) 10 MG tablet Take 1 tablet (10 mg total) by mouth every 8 (eight) hours as needed for muscle spasms.  30 tablet  2  . docusate sodium (COLACE) 100 MG capsule Take 100 mg by mouth 2 (two) times daily.      . ferrous sulfate 325 (65 FE) MG tablet Take 1 tablet (325 mg total) by mouth daily with breakfast.  30 tablet  11  . omeprazole (PRILOSEC) 40 MG capsule       . Prenatal Vit-Fe Fumarate-FA (PRENATAL MULTIVITAMIN) TABS Take 1 tablet by mouth daily.      Marland Kitchen PRESCRIPTION MEDICATION Apply 1 application topically daily. Pt states she is using a cream for her stomach by Rx, doesn't know name of medication, pharmacy closed at time.      Marland Kitchen PRESCRIPTION MEDICATION Apply 1 application topically daily. Pt states she uses a prescription cream on her face, does not  know medication name, pharmacy closed at time.      . promethazine (PHENERGAN) 25 MG tablet Take 0.5 tablets (12.5 mg total) by mouth every 6 (six) hours as needed for nausea.  20 tablet  0  . zolpidem (AMBIEN CR) 12.5 MG CR tablet Take 1 tablet (12.5 mg total) by mouth at bedtime as needed for sleep.  30 tablet  0    Review of Systems  Constitutional: Negative.   HENT: Negative.   Eyes: Negative.   Respiratory: Negative.   Cardiovascular: Negative.   Gastrointestinal: Negative.   Genitourinary: Negative.   Musculoskeletal: Negative.   Skin: Negative.   Neurological: Negative.   Endo/Heme/Allergies: Negative.   Psychiatric/Behavioral: Negative.    Physical Exam   Last menstrual period 01/11/2011.  Physical Exam  Constitutional: She is oriented to person, place, and time. She appears well-nourished.  Eyes: Conjunctivae normal and EOM are normal. Pupils are equal, round, and reactive to light.  Neck:  Normal range of motion.  Cardiovascular: Normal rate, regular rhythm and normal heart sounds.   Respiratory: Effort normal and breath sounds normal.  GI: Soft. Bowel sounds are normal.  Genitourinary:       For evaluation of uterine contractions  Musculoskeletal: Normal range of motion.  Neurological: She is alert and oriented to person, place, and time. She has normal reflexes.  Skin: Skin is warm and dry.  Psychiatric: She has a normal mood and affect.   BP 118/93  Pulse 110  Temp 96 F (35.6 C) (Oral)  Resp 20  LMP 01/11/2011   MAU Course  Procedures  NST and SVE for evaluation of contractions.  Assessment and Plan  SVE: 2/70%/-2. JWJ:XBJYNWGN 125 bpm, Cat 1 tracing         Contractions: 1: 2 -5 mins, palpate mild/ moderate at intervals, irregular pattern.         Uterus/ abdomen soft in between contractions. GBS: neg. The patient has an appointment at [facility] today. Advised to attend. The patient decided to stay for further monitoring and was re- evaluated. SVE: 5-6 /80%/-2. Admit to birthing Suites for labor management. Patient desires Epidural for pain management while in labor. Ulmer Degen, CNM, 10/14/2011, 4:59 AM

## 2011-10-14 NOTE — Anesthesia Preprocedure Evaluation (Signed)
Anesthesia Evaluation  Patient identified by MRN, date of birth, ID band Patient awake    Reviewed: Allergy & Precautions, H&P , NPO status , Patient's Chart, lab work & pertinent test results  Airway Mallampati: I TM Distance: >3 FB Neck ROM: full    Dental No notable dental hx.    Pulmonary  breath sounds clear to auscultation  Pulmonary exam normal       Cardiovascular negative cardio ROS      Neuro/Psych    GI/Hepatic Neg liver ROS, GERD-  Medicated,  Endo/Other  negative endocrine ROS  Renal/GU negative Renal ROS  negative genitourinary   Musculoskeletal negative musculoskeletal ROS (+)   Abdominal Normal abdominal exam  (+)   Peds negative pediatric ROS (+)  Hematology negative hematology ROS (+)   Anesthesia Other Findings   Reproductive/Obstetrics (+) Pregnancy                           Anesthesia Physical Anesthesia Plan  ASA: II  Anesthesia Plan: Epidural   Post-op Pain Management:    Induction:   Airway Management Planned:   Additional Equipment:   Intra-op Plan:   Post-operative Plan:   Informed Consent: I have reviewed the patients History and Physical, chart, labs and discussed the procedure including the risks, benefits and alternatives for the proposed anesthesia with the patient or authorized representative who has indicated his/her understanding and acceptance.     Plan Discussed with:   Anesthesia Plan Comments:         Anesthesia Quick Evaluation

## 2011-10-14 NOTE — MAU Note (Signed)
Pt says thru husband - she started hurting at 12mn.     ve in office - closed.

## 2011-10-14 NOTE — Progress Notes (Signed)
Comfortable, some pressure, very agitated when awakes briefly with uc O BP 129/80  Pulse 112  Temp 97.4 F (36.3 C) (Oral)  Resp 22  SpO2 99%  LMP 01/11/2011      fhts 130 LTV mod variable with rapid recovery      abd soft between uc      Contractions q 2-3 mod      Vag 8 100 0 VTX R A active labor P epidural pca, continue care Lavera Guise, CNM

## 2011-10-14 NOTE — Progress Notes (Signed)
Comfortable, some pressure O BP 118/93  Pulse 110  Temp 96 F (35.6 C) (Oral)  Resp 20  LMP 01/11/2011 BP 118/93  Pulse 110  Temp 96 F (35.6 C) (Oral)  Resp 20  LMP 01/11/2011 BP 118/93  Pulse 110  Temp 96 F (35.6 C) (Oral)  Resp 20  LMP 01/11/2011      fhts category 1      abd soft between uc      Contractions q 3 mod      Vag 6 100 0 VTX R moderate meconium nonparticulate A active labor P epidural, continue care Lavera Guise, CNM
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
## 2011-10-15 ENCOUNTER — Encounter: Payer: Medicaid Other | Admitting: Obstetrics and Gynecology

## 2011-10-15 LAB — CBC
Hemoglobin: 9.7 g/dL — ABNORMAL LOW (ref 12.0–15.0)
MCH: 27.3 pg (ref 26.0–34.0)
MCHC: 32.4 g/dL (ref 30.0–36.0)
Platelets: 163 10*3/uL (ref 150–400)
RDW: 15.1 % (ref 11.5–15.5)

## 2011-10-15 MED ORDER — INFLUENZA VIRUS VACC SPLIT PF IM SUSP
0.5000 mL | INTRAMUSCULAR | Status: AC
  Administered 2011-10-16: 0.5 mL via INTRAMUSCULAR
  Filled 2011-10-15: qty 0.5

## 2011-10-15 MED ORDER — RHO D IMMUNE GLOBULIN 1500 UNIT/2ML IJ SOLN
300.0000 ug | Freq: Once | INTRAMUSCULAR | Status: AC
  Administered 2011-10-15: 300 ug via INTRAMUSCULAR
  Filled 2011-10-15: qty 2

## 2011-10-15 NOTE — Progress Notes (Signed)
Post Partum Day 1:S/P SVB with vaginal lac Subjective: Felt dizzy this am, but has not eaten breakfast or had much fluid to drink.  Up to BR without syncope.   Husband at bedside, attentive.  Patient appears in good spirits. Feeding:  Breast Contraceptive plan:   May be interested in Mirena  Objective: Blood pressure 93/60, pulse 77, temperature 97.3 F (36.3 C), temperature source Oral, resp. rate 16, last menstrual period 01/11/2011, SpO2 98.00%, unknown if currently breastfeeding.  Physical Exam:  General: alert Lochia: appropriate Uterine Fundus: firm Incision: healing well DVT Evaluation: No evidence of DVT seen on physical exam. Negative Homan's sign.   Basename 10/15/11 0610 10/14/11 0650  HGB 9.7* 10.9*  HCT 29.9* 33.4*    Assessment/Plan: Encouraged patient to meet her own needs of fluids and nutrition.  Husband ordering meal now. Reviewed information on Mirena with patient and husband. Anticipate d/c tomorrow. Will try to discuss social situation with patient when husband is absent.   LOS: 1 day   Altonio Schwertner 10/15/2011, 11:55 AM

## 2011-10-15 NOTE — Progress Notes (Signed)
Ur chart review completed.  

## 2011-10-15 NOTE — Anesthesia Postprocedure Evaluation (Signed)
  Anesthesia Post-op Note  Patient: [name] [name]  Procedure(s) Performed: * No procedures listed *  Patient Location: Mother/Baby  Anesthesia Type: Epidural  Level of Consciousness: awake, alert  and oriented  Airway and Oxygen Therapy: Patient Spontanous Breathing  Post-op Pain: none  Post-op Assessment: Post-op Vital signs reviewed and Patient's Cardiovascular Status Stable  Post-op Vital Signs: Reviewed and stable  Complications: No apparent anesthesia complications

## 2011-10-15 NOTE — Progress Notes (Signed)
Arabic interpreter scheduled for 10am Thursday 10/16/11.

## 2011-10-16 ENCOUNTER — Encounter (HOSPITAL_COMMUNITY): Payer: Self-pay | Admitting: *Deleted

## 2011-10-16 LAB — RH IG WORKUP (INCLUDES ABO/RH)
ABO/RH(D): A NEG
Gestational Age(Wks): 39

## 2011-10-16 MED ORDER — OXYCODONE-ACETAMINOPHEN 5-325 MG PO TABS: 1.0000 | ORAL_TABLET | ORAL | Status: DC | PRN

## 2011-10-16 MED ORDER — IBUPROFEN 600 MG PO TABS: 600.0000 mg | ORAL_TABLET | Freq: Four times a day (QID) | ORAL | Status: DC | PRN
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
## 2011-11-10 ENCOUNTER — Other Ambulatory Visit: Payer: Self-pay | Admitting: Family Medicine

## 2011-11-17 ENCOUNTER — Telehealth: Payer: Self-pay | Admitting: *Deleted

## 2011-11-17 NOTE — Telephone Encounter (Signed)
PA  Required for clindamycin/benz peroxide gel. Form placed in MD box.

## 2011-11-21 ENCOUNTER — Telehealth: Payer: Self-pay | Admitting: Family Medicine

## 2011-11-21 MED ORDER — CLINDAMYCIN PHOS-BENZOYL PEROX 1-5 % EX GEL: Freq: Two times a day (BID) | CUTANEOUS | Status: DC

## 2011-11-21 NOTE — Telephone Encounter (Signed)
I looked up benzaclin and it is listed as a preferred drug on the [facility] Preferred drug list. Resent Rx with smaller quantity 25 instead of 50 to see if this will go through.

## 2011-11-21 NOTE — Telephone Encounter (Signed)
benzaclin is a preferred drug. Resent Rx with smaller quantity, 25 instead of 50.

## 2011-11-28 ENCOUNTER — Encounter: Payer: Self-pay | Admitting: Obstetrics and Gynecology

## 2011-11-28 ENCOUNTER — Ambulatory Visit (INDEPENDENT_AMBULATORY_CARE_PROVIDER_SITE_OTHER): Payer: Medicaid Other | Admitting: Obstetrics and Gynecology

## 2011-11-28 DIAGNOSIS — IMO0002 Reserved for concepts with insufficient information to code with codable children: Secondary | ICD-10-CM

## 2011-11-28 DIAGNOSIS — R6889 Other general symptoms and signs: Secondary | ICD-10-CM

## 2011-11-28 NOTE — Progress Notes (Signed)
[name] [name]  is 6 weeks postpartum following a spontaneous vaginal delivery at 74 gestational weeks Date: 10/14/2011 female baby named Lilly delivered by [facility].  Breastfeeding: no Bottlefeeding:  yes  Post-partum blues / depression:  no  EPDS score:  11  History of abnormal Pap:  no  Last Pap: Date  03/2011 Gestational diabetes:  no  Contraception:  Desires IUD  Normal urinary function:  no Normal GI function:  no Returning to work:  no   Subjective:     [name] [name] is a 26 y.o. female who presents for a postpartum visit.  I have fully reviewed the prenatal and intrapartum course.    Patient is not sexually active.   The following portions of the patient's history were reviewed and updated as appropriate: allergies, current medications, past family history, past medical history, past social history, past surgical history and problem list.  Review of Systems Pertinent items are noted in HPI.   Objective:    There were no vitals taken for this visit.  General:  alert, cooperative and no distress     Lungs: clear to auscultation bilaterally  Heart:  regular rate and rhythm, S1, S2 normal, no murmur  Abdomen: soft, non-tender; bowel sounds normal; no masses,  no organomegaly   Vulva:  normal  Vagina: normal vagina  Cervix:  normal  Corpus: normal size, contour, position, consistency, mobility, non-tender RV  Adnexa:  normal adnexa  Uterus Retroverted          Assessment:     Normal postpartum exam.  LGSIL 02/2011 Pap smear not done at today's visit.   Plan:  Will schedule colpo with cultures then Mirena insertion  Mirena was reviewed with the patient  With expected benefits of: lack of estrogen,5 year duration, high reliability at 99.9%, reversibility, reduction or cessation of menstrual flow and improvement or resolution of dysmenorrhea/pelvic pain. Risks at the time of insertion were reviewed: dysfunctional uterine bleeding lasting up to 6 months, infection and uterine  perforation which may require laparoscopy for retrieval.   Instructions for day of insertion discussed:  yes avoidance of unprotected intercourse 2 weeks prior, best to schedule during a menstrual cycle and use of Ibuprofen 600 mg 1 hour before appointment.   GC/Chlamydia was collected at this visit: no   Silverio Lay MD 11/28/2011 8:44 AM

## 2011-12-12 ENCOUNTER — Telehealth: Payer: Self-pay | Admitting: Obstetrics and Gynecology

## 2011-12-12 NOTE — Telephone Encounter (Signed)
Pt is scheduled for mirena insertion on wed but was supposed to have colpo first.

## 2011-12-15 NOTE — Telephone Encounter (Signed)
Per SR last note pt is to have colposcopy done with Cultures before Mirena insertion. Advised pt that apt was going to be canceled for Wed. I am going to find out pt ins info. And give call back tomorrow.   Kim, Can you please let me know how long Pt's MCD is going to last for the colpo and mirena. Thanks,  Darien Ramus, CMA

## 2011-12-16 NOTE — Telephone Encounter (Signed)
Per ins dept. Pt ins. Terminates 01/06/12.  LVM for pt to return call to schedule colpo and mirena insertion before this date. Darien Ramus, CMA

## 2011-12-17 ENCOUNTER — Encounter: Payer: Medicaid Other | Admitting: Obstetrics and Gynecology

## 2011-12-18 NOTE — Telephone Encounter (Signed)
Return call from pt. Colpo scheduled with SR for tomorrow 12/19/11. Tried to schedule mirena insertion. Pt stated that she would prefer to do that tomorrow when she comes to office because she is unaware of husbands schedule. Also stated that she was still unsure mirena was the contraception she wanted.   Darien Ramus, CMA

## 2011-12-18 NOTE — Telephone Encounter (Signed)
Spoke with Husband states that pt is sleeping. Advised pt that we are needing to schedule apt's for pt. Husband will having pt to call office today. Darien Ramus, CMA

## 2011-12-19 ENCOUNTER — Ambulatory Visit (INDEPENDENT_AMBULATORY_CARE_PROVIDER_SITE_OTHER): Payer: Medicaid Other | Admitting: Obstetrics and Gynecology

## 2011-12-19 ENCOUNTER — Encounter: Payer: Self-pay | Admitting: Obstetrics and Gynecology

## 2011-12-19 VITALS — BP 110/60 | Wt 138.0 lb

## 2011-12-19 DIAGNOSIS — Z0000 Encounter for general adult medical examination without abnormal findings: Secondary | ICD-10-CM

## 2011-12-19 DIAGNOSIS — IMO0002 Reserved for concepts with insufficient information to code with codable children: Secondary | ICD-10-CM

## 2011-12-19 DIAGNOSIS — R6889 Other general symptoms and signs: Secondary | ICD-10-CM

## 2011-12-19 LAB — POCT URINE PREGNANCY: Preg Test, Ur: NEGATIVE

## 2011-12-19 MED ORDER — FOLIC ACID 1 MG PO TABS: 1.0000 mg | ORAL_TABLET | Freq: Every day | ORAL | Status: AC

## 2011-12-19 NOTE — Addendum Note (Signed)
Addended by: Darien Ramus on: 12/19/2011 02:13 PM   Modules accepted: Orders

## 2011-12-19 NOTE — Progress Notes (Signed)
Colposcopy Procedure Note  Indications: Pap smear on February 2013 showed: low-grade squamous intraepithelial neoplasia (LGSIL - encompassing HPV,mild dysplasia,CIN I). Previous colposcopy: n/a. Prior cervical treatment: no treatment.  Procedure Details  The risks and benefits of the procedure and Written informed consent obtained.  Speculum placed in vagina and excellent visualization of cervix achieved, cervix swabbed x 3 with acetic acid solution.  Findings: Cervix: visible lesion(s) at 7 o'clock; endocervical curettage performed, cervical biopsies taken at 7 o'clock, specimen labelled and sent to pathology and hemostasis achieved with Monsel's solution. Vaginal inspection: normal without visible lesions. Vulvar colposcopy: normal mucosa without lesions.  Specimens: 7:00 & ECC  Complications: none.  Plan: Specimens labelled and sent to Pathology. Return to discuss Pathology results in 2 weeks. Pap collected with GC/Chlamydia: pt still deciding on Mirena. Questions answered  Mirena was reviewed with the patient With expected benefits of: lack of estrogen,5 year duration, high reliability at 99.9%, reversibility, reduction or cessation of menstrual flow and improvement or resolution of dysmenorrhea/pelvic pain. Risks at the time of insertion were reviewed: dysfunctional uterine bleeding lasting up to 6 months, infection and uterine perforation which may require laparoscopy for retrieval.   Instructions for day of insertion discussed:  yes avoidance of unprotected intercourse 2 weeks prior, best to schedule during a menstrual cycle and use of Ibuprofen 600 mg 1 hour before appointment.  GC/Chlamydia was collected at this visit: yes   Silverio Lay MD

## 2011-12-22 LAB — PAP IG, CT-NG, RFX HPV ASCU
Chlamydia Probe Amp: NEGATIVE
GC Probe Amp: NEGATIVE

## 2012-01-15 ENCOUNTER — Encounter: Payer: Medicaid Other | Admitting: Obstetrics and Gynecology

## 2012-02-25 ENCOUNTER — Encounter: Payer: Medicaid Other | Admitting: Obstetrics and Gynecology

## 2012-10-08 IMAGING — US US OB COMP LESS 14 WK
1 series · 14 of 28 positions shown · non-contrast
Comparison: None.

CLINICAL DATA: Nausea, vomiting and cramping.

OBSTETRIC <14 WK ULTRASOUND
TECHNIQUE: Transabdominal ultrasound was performed for evaluation
of the gestation as well as the maternal uterus and adnexal
regions.

[Series 1: us ob comp less 14 wks · 14 of 28 slices shown]
[im 2/28]
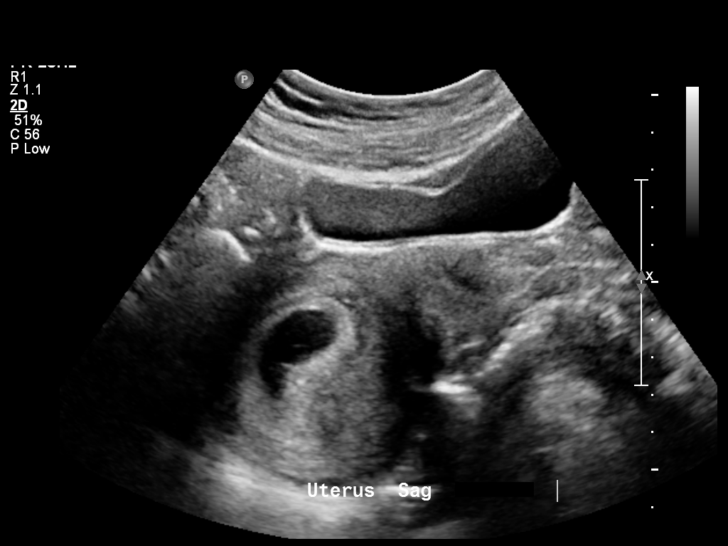
[im 4/28]
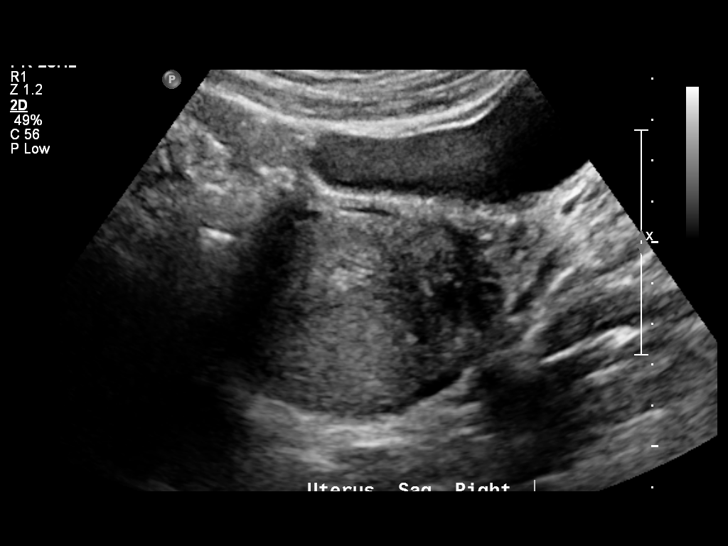
[im 6/28]
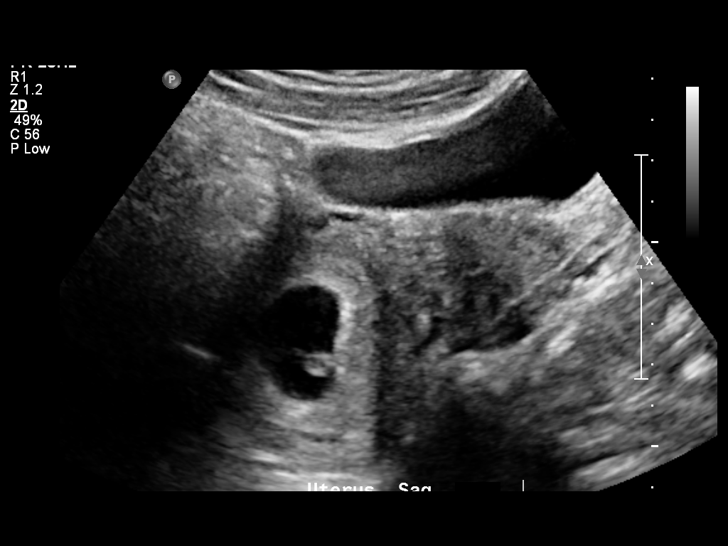
[im 8/28]
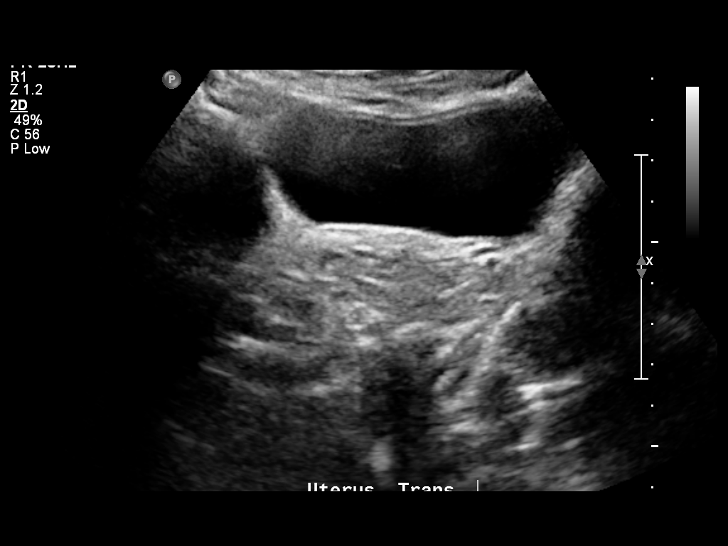
[im 10/28]
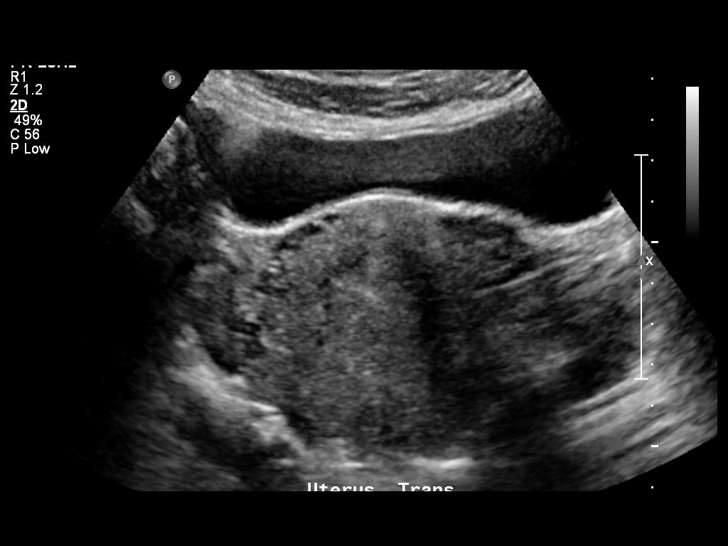
[im 12/28]
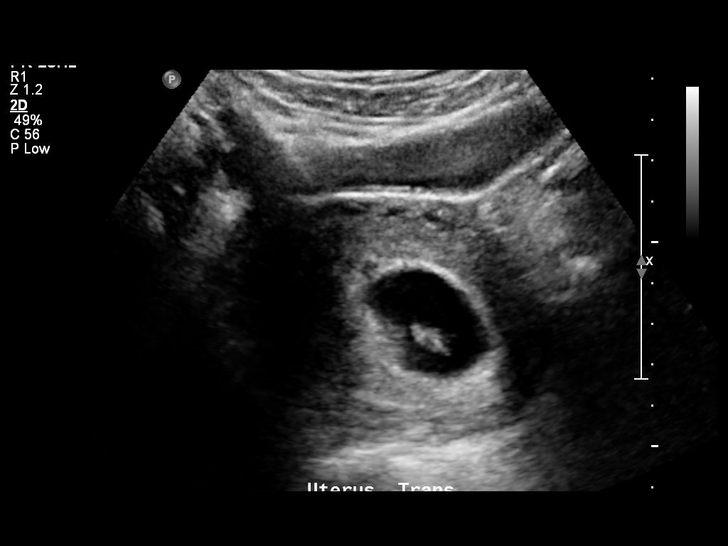
[im 14/28]
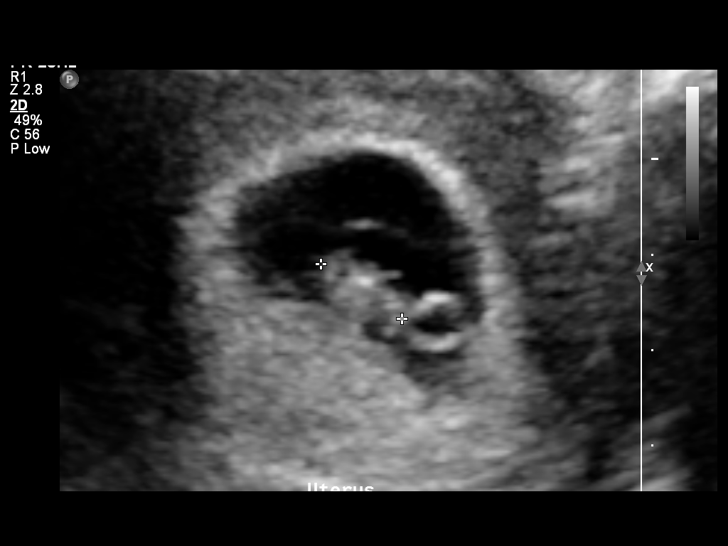
[im 16/28]
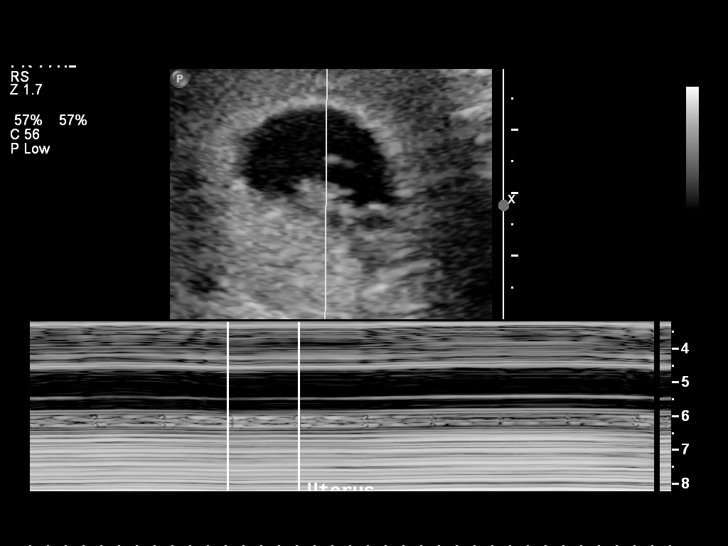
[im 18/28]
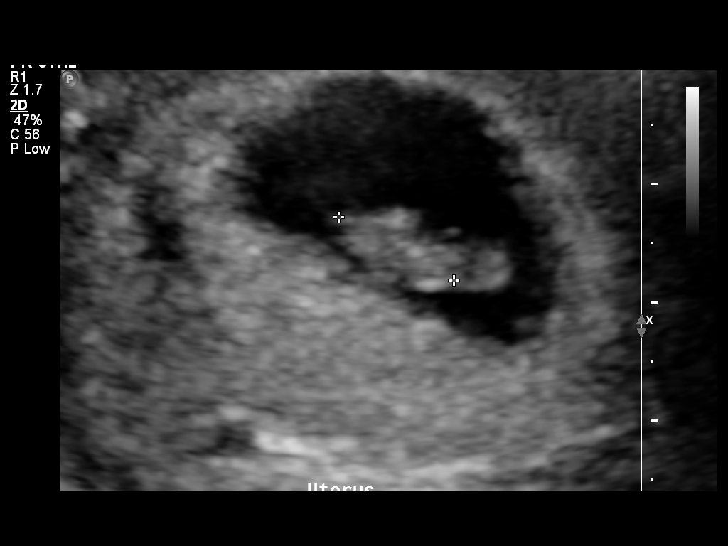
[im 20/28]
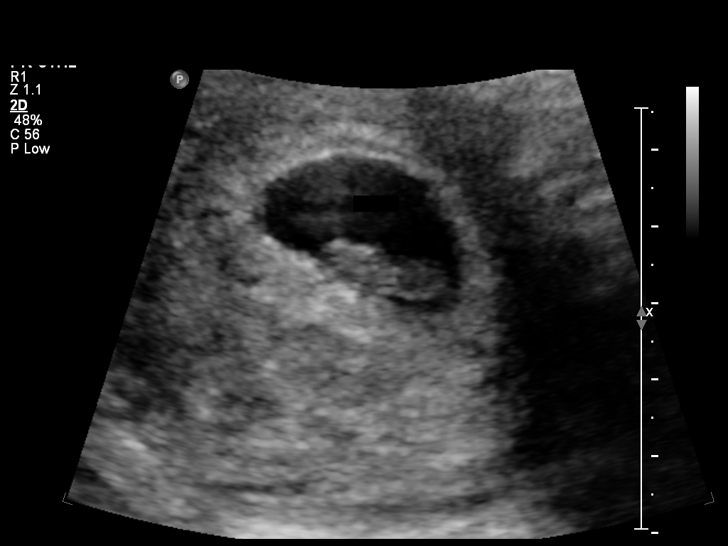
[im 22/28]
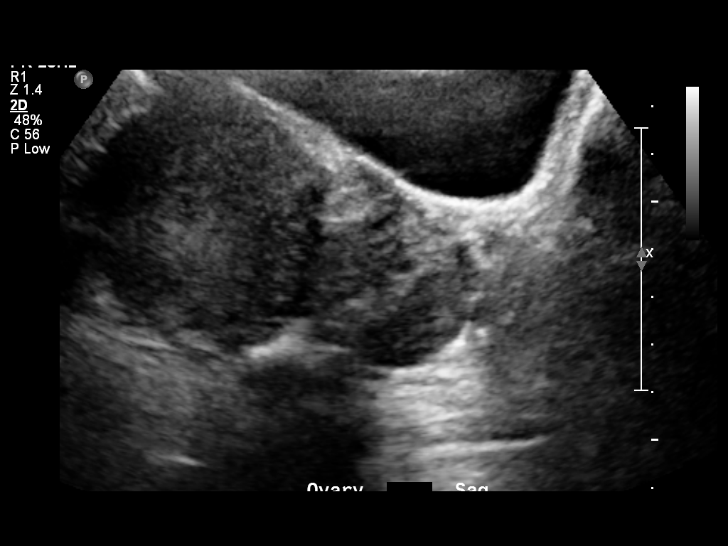
[im 24/28]
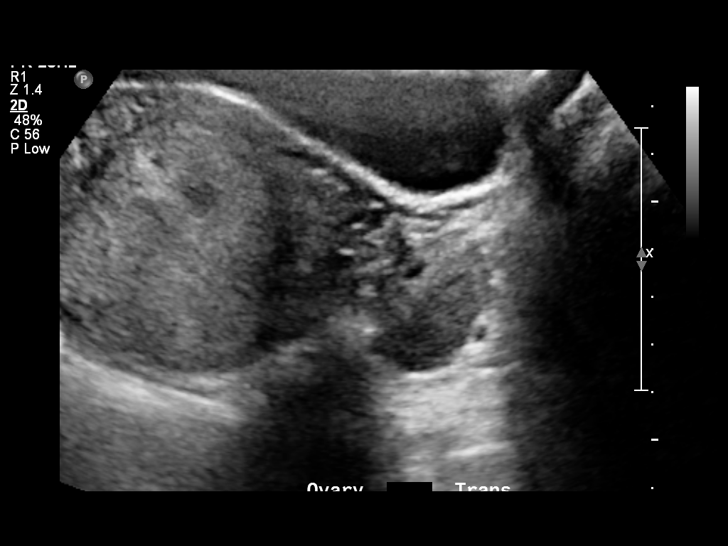
[im 26/28]
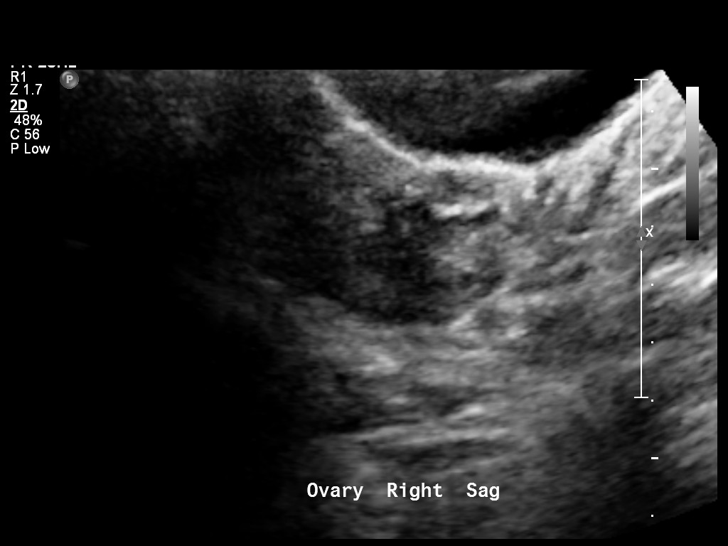
[im 28/28]
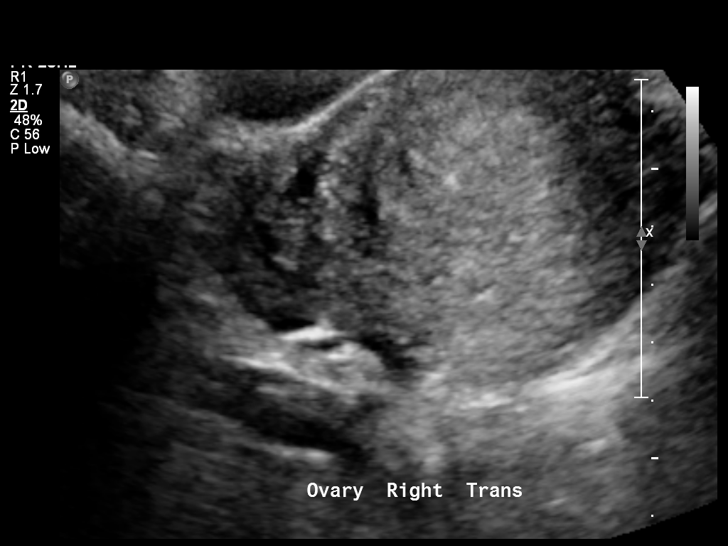

[14 of 28 positions shown; findings below may reference images not displayed]

Intrauterine gestational sac: Visualized/normal in shape.
Yolk sac: Present
Embryo: Present
Cardiac Activity: Present
Heart Rate: 150 bpm

CRL:  11 mm  7w  2d           US EDC: 10/19/2011

Maternal uterus/Adnexae:
There is an intrauterine pregnancy.  Left ovary measures 2.5 x
x 1.8 cm with a normal appearance.  Right ovary measures 2.4 x
x 1.3 cm.  No evidence of free fluid. No significant subchorionic
hemorrhage.
IMPRESSION: Single live intrauterine pregnancy.  Calculated gestational age by
ultrasound is 7 weeks 2 days.

## 2013-11-07 ENCOUNTER — Encounter: Payer: Self-pay | Admitting: Obstetrics and Gynecology

## 2014-01-04 ENCOUNTER — Ambulatory Visit: Payer: Medicaid Other

## 2014-02-03 ENCOUNTER — Ambulatory Visit: Payer: Medicaid Other | Attending: Family Medicine

## 2014-03-06 ENCOUNTER — Encounter: Payer: Self-pay | Admitting: Family Medicine

## 2014-03-06 ENCOUNTER — Ambulatory Visit: Payer: Medicaid Other | Attending: Family Medicine | Admitting: Family Medicine

## 2014-03-06 VITALS — BP 126/78 | HR 78 | Temp 98.0°F | Resp 16 | Wt 124.0 lb

## 2014-03-06 DIAGNOSIS — L700 Acne vulgaris: Secondary | ICD-10-CM

## 2014-03-06 DIAGNOSIS — M5489 Other dorsalgia: Secondary | ICD-10-CM | POA: Insufficient documentation

## 2014-03-06 DIAGNOSIS — M25519 Pain in unspecified shoulder: Secondary | ICD-10-CM | POA: Insufficient documentation

## 2014-03-06 DIAGNOSIS — R300 Dysuria: Secondary | ICD-10-CM | POA: Insufficient documentation

## 2014-03-06 DIAGNOSIS — G894 Chronic pain syndrome: Secondary | ICD-10-CM | POA: Insufficient documentation

## 2014-03-06 DIAGNOSIS — M25569 Pain in unspecified knee: Secondary | ICD-10-CM | POA: Insufficient documentation

## 2014-03-06 DIAGNOSIS — N898 Other specified noninflammatory disorders of vagina: Secondary | ICD-10-CM

## 2014-03-06 DIAGNOSIS — L709 Acne, unspecified: Secondary | ICD-10-CM | POA: Insufficient documentation

## 2014-03-06 DIAGNOSIS — B353 Tinea pedis: Secondary | ICD-10-CM | POA: Insufficient documentation

## 2014-03-06 DIAGNOSIS — E559 Vitamin D deficiency, unspecified: Secondary | ICD-10-CM | POA: Insufficient documentation

## 2014-03-06 DIAGNOSIS — Z113 Encounter for screening for infections with a predominantly sexual mode of transmission: Secondary | ICD-10-CM | POA: Insufficient documentation

## 2014-03-06 DIAGNOSIS — Z0000 Encounter for general adult medical examination without abnormal findings: Secondary | ICD-10-CM

## 2014-03-06 DIAGNOSIS — Z124 Encounter for screening for malignant neoplasm of cervix: Secondary | ICD-10-CM | POA: Insufficient documentation

## 2014-03-06 DIAGNOSIS — R350 Frequency of micturition: Secondary | ICD-10-CM

## 2014-03-06 LAB — POCT GLYCOSYLATED HEMOGLOBIN (HGB A1C): Hemoglobin A1C: 5.3

## 2014-03-06 LAB — COMPLETE METABOLIC PANEL WITH GFR
ALBUMIN: 4.2 g/dL (ref 3.5–5.2)
ALK PHOS: 45 U/L (ref 39–117)
ALT: <8 U/L (ref 0–35)
AST: 13 U/L (ref 0–37)
BUN: 11 mg/dL (ref 6–23)
CO2: 21 mEq/L (ref 19–32)
Calcium: 8.9 mg/dL (ref 8.4–10.5)
Chloride: 105 mEq/L (ref 96–112)
Creat: 0.65 mg/dL (ref 0.50–1.10)
GFR, Est African American: >89 mL/min
Glucose, Bld: 86 mg/dL (ref 70–99)
POTASSIUM: 3.7 meq/L (ref 3.5–5.3)
Sodium: 139 mEq/L (ref 135–145)
Total Bilirubin: 0.8 mg/dL (ref 0.2–1.2)
Total Protein: 7 g/dL (ref 6.0–8.3)

## 2014-03-06 LAB — CBC
HEMATOCRIT: 38.7 % (ref 36.0–46.0)
HEMOGLOBIN: 12.9 g/dL (ref 12.0–15.0)
MCH: 28.2 pg (ref 26.0–34.0)
MCHC: 33.3 g/dL (ref 30.0–36.0)
MCV: 84.5 fL (ref 78.0–100.0)
MPV: 11.3 fL (ref 8.6–12.4)
Platelets: 254 10*3/uL (ref 150–400)
RBC: 4.58 MIL/uL (ref 3.87–5.11)
RDW: 13.9 % (ref 11.5–15.5)
WBC: 6.9 10*3/uL (ref 4.0–10.5)

## 2014-03-06 LAB — TSH: TSH: 2.084 u[IU]/mL (ref 0.350–4.500)

## 2014-03-06 LAB — POCT URINALYSIS DIPSTICK
Blood, UA: NEGATIVE
Glucose, UA: NEGATIVE
KETONES UA: NEGATIVE
LEUKOCYTES UA: NEGATIVE
NITRITE UA: NEGATIVE
Spec Grav, UA: 1.02
Urobilinogen, UA: 0.2
pH, UA: 7

## 2014-03-06 LAB — VITAMIN B12: Vitamin B-12: 418 pg/mL (ref 211–911)

## 2014-03-06 LAB — POCT URINE PREGNANCY: PREG TEST UR: NEGATIVE

## 2014-03-06 MED ORDER — DOXYCYCLINE HYCLATE 50 MG PO CAPS: 50.0000 mg | ORAL_CAPSULE | Freq: Two times a day (BID) | ORAL | Status: DC

## 2014-03-06 MED ORDER — DULOXETINE HCL 30 MG PO CPEP: 30.0000 mg | ORAL_CAPSULE | Freq: Every day | ORAL | Status: DC
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
## 2014-03-06 NOTE — Assessment & Plan Note (Signed)
Foot fungus: Checking liver function test Plan for Lamisil x 4-12 weeks

## 2014-03-06 NOTE — Assessment & Plan Note (Signed)
Normal UA Urine culture sent out

## 2014-03-06 NOTE — Assessment & Plan Note (Signed)
Scant discharge on todays exam, wet prep done

## 2014-03-06 NOTE — Assessment & Plan Note (Signed)
Pap done today  

## 2014-03-06 NOTE — Progress Notes (Signed)
Patient here to establish care Patient complains of frequent urination and slight  Burning Having a clear/whitish discharge as well Complains of  Joint pain to her knees shoulders and elbows Patient requesting a vaginal exam due to family history of cancers Has some dry peeling skin to the bottom of both feet

## 2014-03-06 NOTE — Patient Instructions (Signed)
[name] [name],  Thank you for coming in today. It was a pleasure seeing you again.  I look forward to being your primary doctor.   1. Chronic joint pains: Start cymbalta 30 mg daily Counseling services available at [facility] [place], [place] and [place].   2. Acne: Doxycyline 50 mg twice daily with full glass of water and sit up for 30 minutes after dose Dermatology referral  3. Foot fungus: Checking liver function test Plan for Lamisil x 4-12 weeks    You will be called with lab result. Normal pelvic exam today. Normal A1c. You do not have diabetes  F/u in 4 weeks  Dr. Adrian Blackwater

## 2014-03-06 NOTE — Assessment & Plan Note (Signed)
Chronic joint pains: Start cymbalta 30 mg daily Counseling services available at [place], [place] and [place].

## 2014-03-06 NOTE — Assessment & Plan Note (Signed)
Acne: mild to moderate  Doxycyline 50 mg twice daily with full glass of water and sit up for 30 minutes after dose Dermatology referral

## 2014-03-06 NOTE — Assessment & Plan Note (Signed)
HIV, RPR, GC/chlam, wet prep

## 2014-03-07 ENCOUNTER — Telehealth: Payer: Self-pay | Admitting: *Deleted

## 2014-03-07 DIAGNOSIS — E559 Vitamin D deficiency, unspecified: Secondary | ICD-10-CM | POA: Insufficient documentation

## 2014-03-07 LAB — VITAMIN D 25 HYDROXY (VIT D DEFICIENCY, FRACTURES): Vit D, 25-Hydroxy: 6 ng/mL — ABNORMAL LOW (ref 30–100)

## 2014-03-07 LAB — URINE CULTURE

## 2014-03-07 LAB — RPR

## 2014-03-07 LAB — HIV ANTIBODY (ROUTINE TESTING W REFLEX): HIV 1&2 Ab, 4th Generation: NONREACTIVE

## 2014-03-07 MED ORDER — VITAMIN D (ERGOCALCIFEROL) 1.25 MG (50000 UNIT) PO CAPS: 50000.0000 [IU] | ORAL_CAPSULE | ORAL | Status: DC

## 2014-03-07 MED ORDER — TERBINAFINE HCL 250 MG PO TABS: 250.0000 mg | ORAL_TABLET | Freq: Every day | ORAL | Status: DC

## 2014-03-07 NOTE — Telephone Encounter (Signed)
[facility] [employer] [facility] [place] [phone] Pt aware of lab results Notified Rx at [employer]

## 2014-03-07 NOTE — Telephone Encounter (Signed)
-----   Message from Minerva Ends, MD sent at 03/07/2014  8:50 AM EST ----- Vit D def will replace  Negative HIV, RPR Normal CBC and CMP

## 2014-03-07 NOTE — Telephone Encounter (Signed)
-----   Message from Minerva Ends, MD sent at 03/07/2014  8:52 AM EST ----- Also sent in lamisil for foot fungus, 6 week course

## 2014-03-07 NOTE — Addendum Note (Signed)
Addended by: Boykin Nearing on: 03/07/2014 08:52 AM   Modules accepted: Orders

## 2014-03-07 NOTE — Assessment & Plan Note (Signed)
A: vit D deficiency P: Oral vit D supplement

## 2014-03-08 LAB — CERVICOVAGINAL ANCILLARY ONLY
Chlamydia: NEGATIVE
NEISSERIA GONORRHEA: NEGATIVE
WET PREP (BD AFFIRM): NEGATIVE
Wet Prep (BD Affirm): NEGATIVE
Wet Prep (BD Affirm): NEGATIVE

## 2014-03-09 LAB — CYTOLOGY - PAP

## 2014-03-13 ENCOUNTER — Ambulatory Visit: Payer: Medicaid Other | Attending: Internal Medicine

## 2014-03-27 ENCOUNTER — Other Ambulatory Visit: Payer: Self-pay | Admitting: *Deleted

## 2014-03-27 DIAGNOSIS — G894 Chronic pain syndrome: Secondary | ICD-10-CM

## 2014-03-27 MED ORDER — DULOXETINE HCL 30 MG PO CPEP: 30.0000 mg | ORAL_CAPSULE | Freq: Every day | ORAL | Status: DC

## 2014-04-04 ENCOUNTER — Encounter: Payer: Self-pay | Admitting: Family Medicine

## 2014-04-04 ENCOUNTER — Ambulatory Visit: Payer: Medicaid Other | Attending: Family Medicine | Admitting: Family Medicine

## 2014-04-04 VITALS — BP 101/69 | HR 90 | Temp 98.4°F | Resp 16 | Ht 62.0 in | Wt 126.0 lb

## 2014-04-04 DIAGNOSIS — K219 Gastro-esophageal reflux disease without esophagitis: Secondary | ICD-10-CM

## 2014-04-04 DIAGNOSIS — G894 Chronic pain syndrome: Secondary | ICD-10-CM

## 2014-04-04 DIAGNOSIS — G8929 Other chronic pain: Secondary | ICD-10-CM | POA: Insufficient documentation

## 2014-04-04 DIAGNOSIS — L700 Acne vulgaris: Secondary | ICD-10-CM | POA: Diagnosis not present

## 2014-04-04 DIAGNOSIS — R1013 Epigastric pain: Secondary | ICD-10-CM | POA: Insufficient documentation

## 2014-04-04 MED ORDER — DULOXETINE HCL 20 MG PO CPEP: 20.0000 mg | ORAL_CAPSULE | Freq: Every day | ORAL | Status: DC

## 2014-04-04 MED ORDER — OMEPRAZOLE 20 MG PO CPDR: 20.0000 mg | DELAYED_RELEASE_CAPSULE | Freq: Every day | ORAL | Status: DC

## 2014-04-04 NOTE — Assessment & Plan Note (Signed)
A: acne on doxycycline P: improving

## 2014-04-04 NOTE — Patient Instructions (Addendum)
Ms. Gadea,  Thank you for coming back for f/u  1. Chronic pains: start cymbalta 20 mg daily (hopefully you will not be dizzy with this lower dose).   2. GERD: acid in stomach. Start omeprazole 20 mg 30 minutes before supper.  F/u in 6 weeks. Call if you cannot tolerate the cymbalta.  Dr. Adrian Blackwater   Food Choices for Gastroesophageal Reflux Disease When you have gastroesophageal reflux disease (GERD), the foods you eat and your eating habits are very important. Choosing the right foods can help ease the discomfort of GERD. WHAT GENERAL GUIDELINES DO I NEED TO FOLLOW?  Choose fruits, vegetables, whole grains, low-fat dairy products, and low-fat meat, fish, and poultry.  Limit fats such as oils, salad dressings, butter, nuts, and avocado.  Keep a food diary to identify foods that cause symptoms.  Avoid foods that cause reflux. These may be different for different people.  Eat frequent small meals instead of three large meals each day.  Eat your meals slowly, in a relaxed setting.  Limit fried foods.  Cook foods using methods other than frying.  Avoid drinking alcohol.  Avoid drinking large amounts of liquids with your meals.  Avoid bending over or lying down until 2-3 hours after eating. WHAT FOODS ARE NOT RECOMMENDED? The following are some foods and drinks that may worsen your symptoms: Vegetables Tomatoes. Tomato juice. Tomato and spaghetti sauce. Chili peppers. Onion and garlic. Horseradish. Fruits Oranges, grapefruit, and lemon (fruit and juice). Meats High-fat meats, fish, and poultry. This includes hot dogs, ribs, ham, sausage, salami, and bacon. Dairy Whole milk and chocolate milk. Sour cream. Cream. Butter. Ice cream. Cream cheese.  Beverages Coffee and tea, with or without caffeine. Carbonated beverages or energy drinks. Condiments Hot sauce. Barbecue sauce.  Sweets/Desserts Chocolate and cocoa. Donuts. Peppermint and spearmint. Fats and Oils High-fat foods,  including [place] fries and potato chips. Other Vinegar. Strong spices, such as black pepper, white pepper, red pepper, cayenne, curry powder, cloves, ginger, and chili powder. The items listed above may not be a complete list of foods and beverages to avoid. Contact your dietitian for more information. Document Released: 12/23/2004 Document Revised: 12/28/2012 Document Reviewed: 10/27/2012 [facility] Patient Information 2015 [place], [place]. This information is not intended to replace advice given to you by your health care provider. Make sure you discuss any questions you have with your health care provider.

## 2014-04-04 NOTE — Progress Notes (Signed)
   Subjective:    Patient ID: [name] [name], female    DOB: 1985/01/17, 29 y.o.   MRN: [id number] CC: f/u chronic pains, acne, GERD   HPI  1. Chronic pains: persistent. Cymbalta 30 mg daily caused dizziness. Has some dysphoric and anxious mood about work. Mother moving back to [place] soon for 2 months.   2. GERD: epigastric pain with eating. Same as when she was pregnant in 2013. prilosec helped the most then. Would like to restart. No ETOH.   3. Acne: improving with doxycyline. Has derm appt in May, 2016.   Soc Hx: non smoker  Review of Systems GI: acid reflux with abdominal pain when eating  Skin: improved acne Psych: anxiety and depressed mood  MSK: body aches      Objective:   Physical Exam BP 101/69 mmHg  Pulse 90  Temp(Src) 98.4 F (36.9 C) (Oral)  Resp 16  Ht 5\' 2"  (1.575 m)  Wt 126 lb (57.153 kg)  BMI 23.04 kg/m2  SpO2 99%  LMP 03/21/2014 General appearance: alert, cooperative and no distress Lungs: normal WOB  Skin: moderate acne on face      Assessment & Plan:

## 2014-04-04 NOTE — Assessment & Plan Note (Signed)
Chronic pains: start cymbalta 20 mg daily (hopefully you will not be dizzy with this lower dose).

## 2014-04-04 NOTE — Progress Notes (Signed)
F/U GERD Stated has acid reflux

## 2014-04-04 NOTE — Assessment & Plan Note (Signed)
GERD: acid in stomach. Start omeprazole 20 mg 30 minutes before supper.

## 2015-11-09 ENCOUNTER — Ambulatory Visit: Payer: Self-pay | Attending: Family Medicine | Admitting: Family Medicine

## 2015-11-09 ENCOUNTER — Encounter: Payer: Self-pay | Admitting: Family Medicine

## 2015-11-09 VITALS — BP 124/77 | HR 79 | Temp 98.8°F | Ht 62.0 in | Wt 147.6 lb

## 2015-11-09 DIAGNOSIS — G894 Chronic pain syndrome: Secondary | ICD-10-CM | POA: Insufficient documentation

## 2015-11-09 DIAGNOSIS — R102 Pelvic and perineal pain: Secondary | ICD-10-CM | POA: Insufficient documentation

## 2015-11-09 DIAGNOSIS — Z3201 Encounter for pregnancy test, result positive: Secondary | ICD-10-CM | POA: Insufficient documentation

## 2015-11-09 DIAGNOSIS — E559 Vitamin D deficiency, unspecified: Secondary | ICD-10-CM

## 2015-11-09 DIAGNOSIS — R42 Dizziness and giddiness: Secondary | ICD-10-CM | POA: Insufficient documentation

## 2015-11-09 DIAGNOSIS — R300 Dysuria: Secondary | ICD-10-CM | POA: Insufficient documentation

## 2015-11-09 LAB — CBC
HCT: 36.9 % (ref 35.0–45.0)
HEMOGLOBIN: 12.2 g/dL (ref 11.7–15.5)
MCH: 28 pg (ref 27.0–33.0)
MCHC: 33.1 g/dL (ref 32.0–36.0)
MCV: 84.6 fL (ref 80.0–100.0)
MPV: 10.9 fL (ref 7.5–12.5)
Platelets: 276 10*3/uL (ref 140–400)
RBC: 4.36 MIL/uL (ref 3.80–5.10)
RDW: 13.8 % (ref 11.0–15.0)
WBC: 7.8 10*3/uL (ref 3.8–10.8)

## 2015-11-09 LAB — POCT URINALYSIS DIPSTICK
Bilirubin, UA: NEGATIVE
Glucose, UA: NEGATIVE
Ketones, UA: NEGATIVE
LEUKOCYTES UA: NEGATIVE
Nitrite, UA: NEGATIVE
PH UA: 6
PROTEIN UA: NEGATIVE
RBC UA: NEGATIVE
Spec Grav, UA: 1.01
Urobilinogen, UA: 0.2

## 2015-11-09 LAB — TSH: TSH: 2.11 mIU/L

## 2015-11-09 LAB — POCT URINE PREGNANCY: PREG TEST UR: POSITIVE — AB

## 2015-11-09 MED ORDER — AMITRIPTYLINE HCL 25 MG PO TABS: ORAL_TABLET | ORAL | 0 refills | Status: DC

## 2015-11-09 NOTE — Assessment & Plan Note (Signed)
Dizziness  CBC, TSH, vit D, BMP with GFR ordered

## 2015-11-09 NOTE — Assessment & Plan Note (Addendum)
Pelvic pain with + pregnancy test, 4 w 5 d based on LMP No bleeding  Precautions provided OB US to determine position

## 2015-11-09 NOTE — Progress Notes (Signed)
Pt is here to establish care.  Pt is having pain in kidney area, pt is also having knee pain, pt is having breast pain, dizzy spells.  Pain level is 7 for legs and back.

## 2015-11-09 NOTE — Patient Instructions (Addendum)
Chandler was seen today for establish care.  Diagnoses and all orders for this visit:  Dysuria -     POCT urinalysis dipstick -     POCT urine pregnancy  Dizziness -     TSH -     Vitamin D, 25-hydroxy -     CBC -     BASIC METABOLIC PANEL WITH GFR  Chronic pain syndrome -     amitriptyline (ELAVIL) 25 MG tablet; Take by mouth 25 mg nightly for first week, then 50 mg nighty for second week, then 75 mg nightly  Pelvic pain -     Cervicovaginal ancillary only  Positive pregnancy test -     hCG, quantitative, pregnancy   Positive urine pregnancy test today Abortion resources today If you develop worsening pelvic pain or bleeding go to [facility] for check and ultrasound.   F/u with me after medical abortion (pregnancy termination)   Start elavil after medical abortion.   F/u with me in 3 weeks   Dr. Adrian Blackwater

## 2015-11-09 NOTE — Progress Notes (Signed)
Subjective:  Patient ID: Kaprice Mulrooney, female    DOB: May 13, 1985  Age: 30 y.o. MRN: [id number]  CC: Breast Pain; Pelvic Pain; and Joint Pain   HPI Shaya Caouette has hx of chronic pain she presents for   1. Body aches: Pain in breast, achy in knees and low back. She feels dizzy, no energy, sleeping for 8-10 hours but still tired. She has felt like this for 5 months.  Denies depression and anxiety.   Her mom died of metastatic breast cancer at age 66, she was diagnosed at age 3. She passed away 4 months ago.  2. Lower pelvic pain x 1 month. Cramping, sharp pains. Pain is intermittent.  She also has urinary frequency, pelvic pressure. Sometimes has pain that radiates from R lower pelvis down to thigh. She has been sexually active. Not using condoms.   Social History  Substance Use Topics  . Smoking status: Never Smoker  . Smokeless tobacco: Never Used  . Alcohol use No    Outpatient Medications Prior to Visit  Medication Sig Dispense Refill  . doxycycline (VIBRAMYCIN) 50 MG capsule Take 1 capsule (50 mg total) by mouth 2 (two) times daily. 60 capsule 1  . DULoxetine (CYMBALTA) 20 MG capsule Take 1 capsule (20 mg total) by mouth daily. 30 capsule 0  . ibuprofen (ADVIL,MOTRIN) 600 MG tablet Take 1 tablet (600 mg total) by mouth every 6 (six) hours as needed for pain. 30 tablet 2  . omeprazole (PRILOSEC) 20 MG capsule Take 1 capsule (20 mg total) by mouth daily before supper. 30 capsule 3  . terbinafine (LAMISIL) 250 MG tablet Take 1 tablet (250 mg total) by mouth daily. 42 tablet 0  . Vitamin D, Ergocalciferol, (DRISDOL) 50000 UNITS CAPS capsule Take 1 capsule (50,000 Units total) by mouth every 7 (seven) days. For 8 weeks 8 capsule 0   No facility-administered medications prior to visit.     ROS Review of Systems  Constitutional: Positive for fatigue. Negative for chills and fever.  Eyes: Negative for visual disturbance.  Respiratory: Negative for shortness of breath.     Cardiovascular: Negative for chest pain.       Breast pain   Gastrointestinal: Negative for abdominal pain and blood in stool.  Genitourinary: Positive for frequency and pelvic pain. Negative for dysuria, hematuria, vaginal bleeding and vaginal discharge.  Musculoskeletal: Positive for arthralgias and myalgias. Negative for back pain.  Skin: Negative for rash.  Allergic/Immunologic: Negative for immunocompromised state.  Neurological: Positive for dizziness.  Hematological: Negative for adenopathy. Does not bruise/bleed easily.  Psychiatric/Behavioral: Negative for dysphoric mood and suicidal ideas.    Objective:  BP 124/77 (BP Location: Right Arm, Patient Position: Sitting, Cuff Size: Small)   Pulse 79   Temp 98.8 F (37.1 C) (Oral)   Ht 5\' 2"  (1.575 m)   Wt 147 lb 9.6 oz (67 kg)   LMP 10/07/2015 (Exact Date)   SpO2 99%   BMI 27.00 kg/m   BP/Weight 11/09/2015 04/04/2014 123XX123  Systolic BP [id number]  Diastolic BP 77 69 78  Wt. (Lbs) 147.6 126 124  BMI 27 23.04 21.27   Physical Exam  Constitutional: She is oriented to person, place, and time. She appears well-developed and well-nourished. No distress.  HENT:  Head: Normocephalic and atraumatic.  Cardiovascular: Normal rate, regular rhythm, normal heart sounds and intact distal pulses.   Pulmonary/Chest: Effort normal and breath sounds normal. Right breast exhibits tenderness. Right breast exhibits no inverted nipple, no mass, no  nipple discharge and no skin change. Left breast exhibits tenderness. Left breast exhibits no inverted nipple, no mass, no nipple discharge and no skin change. Breasts are symmetrical.    Genitourinary: Uterus normal. Cervix exhibits no motion tenderness, no discharge and no friability. Right adnexum displays no mass, no tenderness and no fullness. Left adnexum displays no mass, no tenderness and no fullness. No vaginal discharge found.  Musculoskeletal: She exhibits no edema.  Neurological: She  is alert and oriented to person, place, and time.  Skin: Skin is warm and dry. No rash noted.  Psychiatric: She has a normal mood and affect.   UA: normal U preg: positive   Assessment & Plan:  Darnice was seen today for breast pain, pelvic pain and joint pain.  Diagnoses and all orders for this visit:  Dysuria -     POCT urinalysis dipstick -     POCT urine pregnancy  Dizziness -     TSH -     Vitamin D, 25-hydroxy -     CBC -     BASIC METABOLIC PANEL WITH GFR  Chronic pain syndrome -     amitriptyline (ELAVIL) 25 MG tablet; Take by mouth 25 mg nightly for first week, then 50 mg nighty for second week, then 75 mg nightly  Pelvic pain -     Cervicovaginal ancillary only  Positive pregnancy test -     hCG, quantitative, pregnancy   There are no diagnoses linked to this encounter.  No orders of the defined types were placed in this encounter.   Follow-up: No Follow-up on file.   Boykin Nearing MD

## 2015-11-09 NOTE — Assessment & Plan Note (Signed)
Breast pain, pelvic pain, joint pain dizziness, fatigue in setting of + pregnancy test This is not a desired pregnancy Patient requesting termination resources  Early pregnancy Beta HCG ordered Precautions regarding pain and bleeding given Termination resources given

## 2015-11-10 LAB — BASIC METABOLIC PANEL WITH GFR
BUN: 7 mg/dL (ref 7–25)
CALCIUM: 9.3 mg/dL (ref 8.6–10.2)
CO2: 25 mmol/L (ref 20–31)
Chloride: 101 mmol/L (ref 98–110)
Creat: 0.68 mg/dL (ref 0.50–1.10)
Glucose, Bld: 80 mg/dL (ref 65–99)
POTASSIUM: 3.4 mmol/L — AB (ref 3.5–5.3)
SODIUM: 137 mmol/L (ref 135–146)

## 2015-11-10 LAB — HCG, QUANTITATIVE, PREGNANCY: hCG, Beta Chain, Quant, S: 1385.7 m[IU]/mL — ABNORMAL HIGH

## 2015-11-10 LAB — VITAMIN D 25 HYDROXY (VIT D DEFICIENCY, FRACTURES): VIT D 25 HYDROXY: 14 ng/mL — AB (ref 30–100)

## 2015-11-12 MED ORDER — VITAMIN D (ERGOCALCIFEROL) 1.25 MG (50000 UNIT) PO CAPS: 50000.0000 [IU] | ORAL_CAPSULE | ORAL | 0 refills | Status: DC

## 2015-11-12 MED FILL — AMITRIPTYLINE HCL 25 MG TAB: 25 | 30 days supply | Qty: 90 | Fill #0

## 2015-11-12 NOTE — Addendum Note (Signed)
Addended by: Boykin Nearing on: 11/12/2015 08:45 PM   Modules accepted: Orders

## 2015-11-13 LAB — CERVICOVAGINAL ANCILLARY ONLY
Chlamydia: NEGATIVE
NEISSERIA GONORRHEA: NEGATIVE
WET PREP (BD AFFIRM): NEGATIVE

## 2015-11-13 MED FILL — VIT D2 1.25 MG (50,000 UNIT: 1.25 MG | 56 days supply | Qty: 8 | Fill #0

## 2015-11-14 ENCOUNTER — Telehealth: Payer: Self-pay | Admitting: Family Medicine

## 2015-11-14 NOTE — Telephone Encounter (Signed)
Pt was called and informed of her negative lab results.

## 2015-11-14 NOTE — Telephone Encounter (Signed)
Patient called would like to get lab results. Please f/u

## 2015-12-03 ENCOUNTER — Ambulatory Visit: Payer: Self-pay

## 2015-12-11 ENCOUNTER — Ambulatory Visit: Payer: Self-pay | Attending: Family Medicine

## 2015-12-26 ENCOUNTER — Ambulatory Visit: Payer: Self-pay | Attending: Internal Medicine

## 2016-02-26 ENCOUNTER — Ambulatory Visit (HOSPITAL_COMMUNITY)
Admission: EM | Admit: 2016-02-26 | Discharge: 2016-02-26 | Disposition: A | Discharge status: Home / Self Care | Payer: Self-pay | Attending: Family Medicine | Admitting: Family Medicine

## 2016-02-26 ENCOUNTER — Encounter (HOSPITAL_COMMUNITY): Payer: Self-pay | Admitting: Family Medicine

## 2016-02-26 DIAGNOSIS — G4730 Sleep apnea, unspecified: Secondary | ICD-10-CM | POA: Insufficient documentation

## 2016-02-26 DIAGNOSIS — J029 Acute pharyngitis, unspecified: Secondary | ICD-10-CM | POA: Insufficient documentation

## 2016-02-26 DIAGNOSIS — B349 Viral infection, unspecified: Secondary | ICD-10-CM

## 2016-02-26 DIAGNOSIS — F419 Anxiety disorder, unspecified: Secondary | ICD-10-CM | POA: Insufficient documentation

## 2016-02-26 DIAGNOSIS — A084 Viral intestinal infection, unspecified: Secondary | ICD-10-CM | POA: Insufficient documentation

## 2016-02-26 DIAGNOSIS — Z825 Family history of asthma and other chronic lower respiratory diseases: Secondary | ICD-10-CM | POA: Insufficient documentation

## 2016-02-26 DIAGNOSIS — R509 Fever, unspecified: Secondary | ICD-10-CM | POA: Insufficient documentation

## 2016-02-26 DIAGNOSIS — K2900 Acute gastritis without bleeding: Secondary | ICD-10-CM

## 2016-02-26 LAB — POCT RAPID STREP A: Streptococcus, Group A Screen (Direct): NEGATIVE

## 2016-02-26 LAB — POCT INFECTIOUS MONO SCREEN: Mono Screen: NEGATIVE

## 2016-02-26 MED ORDER — MAGIC MOUTHWASH W/LIDOCAINE: 5.0000 mL | Freq: Three times a day (TID) | ORAL | 0 refills | Status: DC | PRN

## 2016-02-26 MED ORDER — OMEPRAZOLE 40 MG PO CPDR: 40.0000 mg | DELAYED_RELEASE_CAPSULE | Freq: Every day | ORAL | 0 refills | Status: DC

## 2016-02-26 MED ORDER — ONDANSETRON 4 MG PO TBDP
4.0000 mg | ORAL_TABLET | Freq: Once | ORAL | Status: AC
Start: 2016-02-26 — End: 2016-02-26
  Administered 2016-02-26: 4 mg via ORAL

## 2016-02-26 MED ORDER — ACETAMINOPHEN 325 MG PO TABS
ORAL_TABLET | ORAL | Status: AC
  Filled 2016-02-26: qty 2

## 2016-02-26 MED ORDER — ONDANSETRON 4 MG PO TBDP: 4.0000 mg | ORAL_TABLET | Freq: Three times a day (TID) | ORAL | 0 refills | Status: DC | PRN

## 2016-02-26 MED ORDER — CLINDAMYCIN HCL 150 MG PO CAPS: 150.0000 mg | ORAL_CAPSULE | Freq: Three times a day (TID) | ORAL | 0 refills | Status: AC

## 2016-02-26 MED ORDER — ACETAMINOPHEN 325 MG PO TABS
650.0000 mg | ORAL_TABLET | Freq: Once | ORAL | Status: AC
  Administered 2016-02-26: 650 mg via ORAL

## 2016-02-26 MED ORDER — GI COCKTAIL ~~LOC~~
30.0000 mL | Freq: Once | ORAL | Status: AC
  Administered 2016-02-26: 30 mL via ORAL

## 2016-02-26 MED ORDER — ONDANSETRON 4 MG PO TBDP
ORAL_TABLET | ORAL | Status: AC
  Filled 2016-02-26: qty 1

## 2016-02-26 MED ORDER — GI COCKTAIL ~~LOC~~
ORAL | Status: AC
  Filled 2016-02-26: qty 30

## 2016-02-26 NOTE — ED Provider Notes (Signed)
CSN: [id number]     Arrival date & time 02/26/16  1733 History   First MD Initiated Contact with Patient 02/26/16 1916     Chief Complaint  Patient presents with  . Fever  . Sore Throat  . Emesis   (Consider location/radiation/quality/duration/timing/severity/associated sxs/prior Treatment) 31 year old female patient presents to clinic with a 3 to four-day history of fever, body aches, muscle aches, headaches, and fatigue along with nausea, vomiting, and diarrhea. She has not had anything to eat in 2 days due to vomiting. Stating anytime she eats or drinks she promptly throws it up. She has not had any diarrhea or loose stools today, had 3-4 episodes of diarrhea yesterday.  She also complains of a five-day history of sore throat, states it's painful to swallow, and has tender lymph nodes.  She denies any possibility of pregnancy, stating last menstrual period was 02/16/2016.   The history is provided by the patient.  Fever  Associated symptoms: vomiting   Sore Throat   Emesis  Associated symptoms: fever     Past Medical History:  Diagnosis Date  . Anemia 2009  . Anxiety   . Back pain 2010  . Exposure to x-rays 2012   normal  . H/O candidiasis   . H/O varicella   . History of gastroesophageal reflux (GERD) 2010   taking with tums, helps   . Hypotension   . Migraines 2009  . Sleep apnea 2012   Past Surgical History:  Procedure Laterality Date  . COLPOSCOPY  04/02/2011  . NO PAST SURGERIES     Family History  Problem Relation Age of Onset  . Asthma Mother   . Cancer Mother 38    breast, cervix and bone and liver  . Asthma Father   . Cancer    . Heart disease    . Stroke    . Thyroid disease    . Diabetes    . Hypertension    . Hyperlipidemia    . Hypertension Maternal Aunt   . Heart disease Maternal Grandmother   . Hypertension Maternal Grandmother   . Cancer Maternal Grandmother   . Hypertension Maternal Grandfather   . Anesthesia problems Neg Hx     Social History  Substance Use Topics  . Smoking status: Never Smoker  . Smokeless tobacco: Never Used  . Alcohol use No   OB History    Gravida Para Term Preterm AB Living   2 1 1  0 0 1   SAB TAB Ectopic Multiple Live Births   0 0 0 0 1     Review of Systems  Reason unable to perform ROS: as covered in HPI.  Constitutional: Positive for fever.  Gastrointestinal: Positive for vomiting.  All other systems reviewed and are negative.   Allergies  Other  Home Medications   Prior to Admission medications   Medication Sig Start Date End Date Taking? Authorizing Provider  amitriptyline (ELAVIL) 25 MG tablet Take by mouth 25 mg nightly for first week, then 50 mg nighty for second week, then 75 mg nightly 11/09/15   Josalyn Funches, MD  clindamycin (CLEOCIN) 150 MG capsule Take 1 capsule (150 mg total) by mouth 3 (three) times daily. 02/26/16 03/04/16  Barnet Glasgow, NP  magic mouthwash w/lidocaine SOLN Take 5 mLs by mouth 3 (three) times daily as needed for mouth pain. 02/26/16   Barnet Glasgow, NP  omeprazole (PRILOSEC) 40 MG capsule Take 1 capsule (40 mg total) by mouth daily. 02/26/16   Barnet Glasgow,  NP  ondansetron (ZOFRAN ODT) 4 MG disintegrating tablet Take 1 tablet (4 mg total) by mouth every 8 (eight) hours as needed for nausea or vomiting. 02/26/16   Barnet Glasgow, NP  Vitamin D, Ergocalciferol, (DRISDOL) 50000 units CAPS capsule Take 1 capsule (50,000 Units total) by mouth every 7 (seven) days. For 8 weeks 11/12/15   Boykin Nearing, MD   Meds Ordered and Administered this Visit   Medications  acetaminophen (TYLENOL) tablet 650 mg (650 mg Oral Given 02/26/16 1832)  gi cocktail (Maalox,Lidocaine,Donnatal) (30 mLs Oral Given 02/26/16 1930)  ondansetron (ZOFRAN-ODT) disintegrating tablet 4 mg (4 mg Oral Given 02/26/16 1930)    BP 100/71   Pulse 101   Temp 100.6 F (38.1 C)   Resp 18   SpO2 100%  No data found.   Physical Exam  Constitutional: She is oriented to  person, place, and time. She appears well-developed and well-nourished. No distress.  HENT:  Head: Normocephalic.  Right Ear: External ear normal.  Left Ear: External ear normal.  Nose: Nose normal.  Mouth/Throat: Uvula is midline and mucous membranes are normal. Posterior oropharyngeal erythema present. No oropharyngeal exudate or posterior oropharyngeal edema. Tonsils are 3+ on the right. Tonsils are 3+ on the left. Tonsillar exudate.  Eyes: EOM are normal. Pupils are equal, round, and reactive to light.  Neck: Normal range of motion. Neck supple. No JVD present.  Cardiovascular: Normal rate and regular rhythm.   Pulmonary/Chest: Effort normal and breath sounds normal. No respiratory distress. She has no wheezes.  Abdominal: Soft. There is no hepatosplenomegaly. There is tenderness in the epigastric area. There is no rigidity, no CVA tenderness, no tenderness at McBurney's point and negative Murphy's sign.  Lymphadenopathy:       Head (right side): Tonsillar adenopathy present. No submental, no submandibular and no preauricular adenopathy present.       Head (left side): Tonsillar adenopathy present. No submental, no submandibular and no preauricular adenopathy present.    She has cervical adenopathy.  Neurological: She is alert and oriented to person, place, and time.  Skin: Skin is warm. Capillary refill takes less than 2 seconds. She is diaphoretic.  Psychiatric: She has a normal mood and affect.  Nursing note and vitals reviewed.   Urgent Care Course     Procedures (including critical care time)  Labs Review Labs Reviewed  POCT RAPID STREP A  POCT INFECTIOUS MONO SCREEN    Imaging Review No results found.   Visual Acuity Review  Right Eye Distance:   Left Eye Distance:   Bilateral Distance:    Right Eye Near:   Left Eye Near:    Bilateral Near:         MDM   1. Fever, unspecified fever cause   2. Acute gastritis without hemorrhage, unspecified gastritis  type   3. Viral illness   I am treating you tonight for viral gastritis. You have had a GI cocktail tonight of 3 different medications, along with a tablet of Zofran. I have called in a prescription for Zofran take 1 tablet and let it dissolve under the tongue every 8 hours as needed for nausea. I have also prescribed a short course of a proton pump inhibitor called omeprazole. This will help with acid production in the stomach. Take one tablet every day for 14 days.  For your sore throat, I have prescribed a medication: Magic mouthwash. Swish and swallow 5 mL 3 times a day as needed for pain. I have also started  you on a medicine called clindamycin. Take on tablet 3 times a day for 7 days.  Should you not have any improvement in her signs or symptoms, follow-up with your primary care provider, or return to clinic.      Barnet Glasgow, NP 02/26/16 2055

## 2016-02-26 NOTE — Discharge Instructions (Signed)
I am treating you tonight for viral gastritis. You have had a GI cocktail tonight of 3 different medications, along with a tablet of Zofran. I have called in a prescription for Zofran take 1 tablet and let it dissolve under the tongue every 8 hours as needed for nausea. I have also prescribed a short course of a proton pump inhibitor called omeprazole. This will help with acid production in the stomach. Take one tablet every day for 14 days.  For your sore throat, I have prescribed a medication: Magic mouthwash. Swish and swallow 5 mL 3 times a day as needed for pain. I have also started you on a medicine called clindamycin. Take on tablet 3 times a day for 7 days.  Should you not have any improvement in her signs or symptoms, follow-up with your primary care provider, or return to clinic.

## 2016-02-26 NOTE — ED Triage Notes (Signed)
Pt here for fever, sore throat and vomiting.

## 2016-02-27 MED FILL — CLINDAMYCIN HCL 150 MG CAPS: 150 | 7 days supply | Qty: 21 | Fill #0

## 2016-02-27 MED FILL — OMEPRAZOLE 40 MG CPDR: 40 | 14 days supply | Qty: 14 | Fill #0

## 2016-02-27 MED FILL — ONDANSETRON ODT 4 MG TABLET: 4 | 7 days supply | Qty: 20 | Fill #0

## 2016-02-29 LAB — CULTURE, GROUP A STREP (THRC)

## 2016-05-21 ENCOUNTER — Encounter: Payer: Self-pay | Admitting: Family Medicine

## 2017-01-06 DIAGNOSIS — A048 Other specified bacterial intestinal infections: Secondary | ICD-10-CM

## 2017-01-06 HISTORY — DX: Other specified bacterial intestinal infections: A04.8

## 2017-08-25 ENCOUNTER — Ambulatory Visit: Payer: Self-pay | Admitting: Nurse Practitioner

## 2017-10-21 ENCOUNTER — Other Ambulatory Visit (HOSPITAL_COMMUNITY): Payer: Self-pay | Admitting: *Deleted

## 2017-10-21 DIAGNOSIS — N644 Mastodynia: Secondary | ICD-10-CM

## 2017-11-17 ENCOUNTER — Encounter (HOSPITAL_COMMUNITY): Payer: Self-pay

## 2017-11-17 ENCOUNTER — Ambulatory Visit
Admission: RE | Admit: 2017-11-17 | Discharge: 2017-11-17 | Disposition: A | Discharge status: Home / Self Care | Payer: No Typology Code available for payment source | Source: Ambulatory Visit | Attending: Obstetrics and Gynecology | Admitting: Obstetrics and Gynecology

## 2017-11-17 ENCOUNTER — Ambulatory Visit
Admission: RE | Admit: 2017-11-17 | Discharge: 2017-11-17 | Disposition: A | Discharge status: Home / Self Care | Payer: Medicaid Other | Source: Ambulatory Visit | Attending: Obstetrics and Gynecology | Admitting: Obstetrics and Gynecology

## 2017-11-17 ENCOUNTER — Ambulatory Visit (HOSPITAL_COMMUNITY)
Admission: RE | Admit: 2017-11-17 | Discharge: 2017-11-17 | Disposition: A | Discharge status: Home / Self Care | Payer: Medicaid Other | Source: Ambulatory Visit | Attending: Obstetrics and Gynecology | Admitting: Obstetrics and Gynecology

## 2017-11-17 ENCOUNTER — Other Ambulatory Visit (HOSPITAL_COMMUNITY): Payer: Self-pay | Admitting: Obstetrics and Gynecology

## 2017-11-17 VITALS — BP 110/72 | Wt 167.0 lb

## 2017-11-17 DIAGNOSIS — N644 Mastodynia: Secondary | ICD-10-CM

## 2017-11-17 DIAGNOSIS — Z01419 Encounter for gynecological examination (general) (routine) without abnormal findings: Secondary | ICD-10-CM

## 2017-11-17 DIAGNOSIS — N6489 Other specified disorders of breast: Secondary | ICD-10-CM

## 2017-11-17 DIAGNOSIS — N6310 Unspecified lump in the right breast, unspecified quadrant: Secondary | ICD-10-CM

## 2017-11-17 NOTE — Progress Notes (Signed)
Complaints of left outer breast and axillary pain for over 6 months that has been constant over the past 1-2 months. Patient rates the pain at a 6-8 out of 10.  Pap Smear: Pap smear completed today. Last Pap smear was 03/06/2014 at [facility] and normal. Per patient has no history of an abnormal Pap smear. Last Pap smear result is in Epic.  Physical exam: Breasts Breasts symmetrical. No skin abnormalities bilateral breasts. No nipple retraction bilateral breasts. No nipple discharge bilateral breasts. No lymphadenopathy. No lumps palpated bilateral breasts. Complaints of left outer breast and axillary pain on exam. Referred patient to the [facility] for a diagnostic mammogram. Appointment scheduled for Tuesday, November 17, 2017 at 1410.        Pelvic/Bimanual   Ext Genitalia No lesions, no swelling and no discharge observed on external genitalia.         Vagina Vagina pink and normal texture. No lesions or discharge observed in vagina.          Cervix Cervix is present. Cervix pink and of normal texture. Blood observed at cervical os that is consistent with patients menstrual period.    Uterus Uterus is present and palpable. Uterus in normal position and normal size.        Adnexae Bilateral ovaries present and palpable. No tenderness on palpation.         Rectovaginal No rectal exam completed today since patient had no rectal complaints. No skin abnormalities observed on exam.    Smoking History: Patient has never smoked.  Patient Navigation: Patient education provided. Access to services provided for patient through [facility] program.   Breast and Cervical Cancer Risk Assessment: Patient has no family history of breast cancer, known genetic mutations, or radiation treatment to the chest before age 71. Patient has no history of cervical dysplasia, immunocompromised, or DES exposure in-utero. Breast Cancer risk assessment completed. No  breast cancer risk calculated due to patient is less than 27 years old.

## 2017-11-17 NOTE — Patient Instructions (Signed)
Explained breast self awareness with [facility]. Let patient know [facility] will cover Pap smears and HPV typing every 5 years unless has a history of abnormal Pap smears. Referred patient to the [facility] for a diagnostic mammogram. Appointment scheduled for Tuesday, November 17, 2017 at 1410. Patient aware of appointment and will be there. Let patient know will follow up with her within the next couple weeks with results of Pap smear by letter or phone. [name] [name] verbalized understanding.  [name], [name], RN 2:53 PM

## 2017-11-19 LAB — CYTOLOGY - PAP
Diagnosis: NEGATIVE
HPV: NOT DETECTED

## 2017-11-23 ENCOUNTER — Ambulatory Visit
Admission: RE | Admit: 2017-11-23 | Discharge: 2017-11-23 | Disposition: A | Discharge status: Home / Self Care | Payer: No Typology Code available for payment source | Source: Ambulatory Visit | Attending: Obstetrics and Gynecology | Admitting: Obstetrics and Gynecology

## 2017-11-23 ENCOUNTER — Other Ambulatory Visit (HOSPITAL_COMMUNITY): Payer: Self-pay | Admitting: Obstetrics and Gynecology

## 2017-11-23 DIAGNOSIS — N6489 Other specified disorders of breast: Secondary | ICD-10-CM

## 2017-11-23 DIAGNOSIS — N6310 Unspecified lump in the right breast, unspecified quadrant: Secondary | ICD-10-CM

## 2017-11-25 ENCOUNTER — Encounter (HOSPITAL_COMMUNITY): Payer: Self-pay | Admitting: *Deleted

## 2017-11-26 ENCOUNTER — Other Ambulatory Visit: Payer: No Typology Code available for payment source

## 2017-11-30 ENCOUNTER — Encounter (HOSPITAL_COMMUNITY): Payer: Self-pay | Admitting: *Deleted

## 2017-11-30 NOTE — Progress Notes (Signed)
Letter mailed to patient with negative pap smear results. HPV was negative. Next pap smear due in five years. 

## 2018-06-08 DIAGNOSIS — Z3200 Encounter for pregnancy test, result unknown: Secondary | ICD-10-CM | POA: Diagnosis not present

## 2018-06-08 DIAGNOSIS — Z3009 Encounter for other general counseling and advice on contraception: Secondary | ICD-10-CM | POA: Diagnosis not present

## 2018-06-22 DIAGNOSIS — N925 Other specified irregular menstruation: Secondary | ICD-10-CM | POA: Diagnosis not present

## 2018-06-22 DIAGNOSIS — O3680X9 Pregnancy with inconclusive fetal viability, other fetus: Secondary | ICD-10-CM | POA: Diagnosis not present

## 2018-06-22 DIAGNOSIS — Z3A01 Less than 8 weeks gestation of pregnancy: Secondary | ICD-10-CM | POA: Diagnosis not present

## 2018-06-22 DIAGNOSIS — R112 Nausea with vomiting, unspecified: Secondary | ICD-10-CM | POA: Diagnosis not present

## 2018-06-22 LAB — OB RESULTS CONSOLE RUBELLA ANTIBODY, IGM: Rubella: IMMUNE

## 2018-07-01 DIAGNOSIS — Z124 Encounter for screening for malignant neoplasm of cervix: Secondary | ICD-10-CM | POA: Diagnosis not present

## 2018-07-01 DIAGNOSIS — R112 Nausea with vomiting, unspecified: Secondary | ICD-10-CM | POA: Diagnosis not present

## 2018-07-01 DIAGNOSIS — O3680X9 Pregnancy with inconclusive fetal viability, other fetus: Secondary | ICD-10-CM | POA: Diagnosis not present

## 2018-07-01 LAB — OB RESULTS CONSOLE GC/CHLAMYDIA
Chlamydia: NEGATIVE
Gonorrhea: NEGATIVE

## 2018-07-14 ENCOUNTER — Inpatient Hospital Stay (HOSPITAL_COMMUNITY)
Admission: AD | Admit: 2018-07-14 | Discharge: 2018-07-15 | Disposition: A | Discharge status: Home / Self Care | Payer: Medicaid Other | Attending: Obstetrics and Gynecology | Admitting: Obstetrics and Gynecology

## 2018-07-14 ENCOUNTER — Other Ambulatory Visit: Payer: Self-pay

## 2018-07-14 ENCOUNTER — Encounter (HOSPITAL_COMMUNITY): Payer: Self-pay

## 2018-07-14 DIAGNOSIS — Z3A09 9 weeks gestation of pregnancy: Secondary | ICD-10-CM | POA: Diagnosis not present

## 2018-07-14 DIAGNOSIS — O210 Mild hyperemesis gravidarum: Secondary | ICD-10-CM | POA: Diagnosis not present

## 2018-07-14 DIAGNOSIS — G44209 Tension-type headache, unspecified, not intractable: Secondary | ICD-10-CM

## 2018-07-14 DIAGNOSIS — Z3A33 33 weeks gestation of pregnancy: Secondary | ICD-10-CM | POA: Diagnosis not present

## 2018-07-14 LAB — URINALYSIS, ROUTINE W REFLEX MICROSCOPIC
Bacteria, UA: NONE SEEN
Bilirubin Urine: NEGATIVE
Glucose, UA: NEGATIVE mg/dL
Ketones, ur: 80 mg/dL — AB
Leukocytes,Ua: NEGATIVE
Nitrite: NEGATIVE
Protein, ur: NEGATIVE mg/dL
Specific Gravity, Urine: 1.024 (ref 1.005–1.030)
pH: 5 (ref 5.0–8.0)

## 2018-07-14 LAB — POCT PREGNANCY, URINE: Preg Test, Ur: POSITIVE — AB

## 2018-07-14 MED ORDER — LACTATED RINGERS IV BOLUS
1000.0000 mL | Freq: Once | INTRAVENOUS | Status: AC
  Administered 2018-07-14: 1000 mL via INTRAVENOUS

## 2018-07-14 MED ORDER — BUTALBITAL-APAP-CAFFEINE 50-325-40 MG PO TABS
2.0000 | ORAL_TABLET | Freq: Once | ORAL | Status: AC
  Administered 2018-07-14: 2 via ORAL
  Filled 2018-07-14: qty 2

## 2018-07-14 MED ORDER — SODIUM CHLORIDE 0.9 % IV SOLN
8.0000 mg | Freq: Once | INTRAVENOUS | Status: AC
  Administered 2018-07-15: 01:00:00 8 mg via INTRAVENOUS
  Filled 2018-07-14 (×2): qty 4

## 2018-07-14 NOTE — MAU Note (Signed)
Pt called out stating she vomited.

## 2018-07-14 NOTE — MAU Note (Signed)
Pt reports to MAU c/o NV and she states she is unable to keep anything down. Pt reports she is dizzy and cold. Pt reports she is unable to walk or drive. Pt reports back pain and a burning in her mid stomach region. Pt reports she is 9weeks 3days. Pt cant recall her LMP. No bleeding or abdominal pain.

## 2018-07-14 NOTE — MAU Provider Note (Addendum)
Patient [name] [name] is a 33 y.o. G4P1011 at [redacted]w[redacted]d here with complaints nausea and vomiting all day today. This is an on-going problem over the past three weeks. She denies bleeding, abnormal discharge, dysuria, abdominal pain or other ob-gyn complaint. She endorses a headache. She denies fever, SOB, chills, body aches or other complaint.  History     CSN: [id number]  Arrival date and time: 07/14/18 1850   First Provider Initiated Contact with Patient 07/14/18 2313      Chief Complaint  Patient presents with  . Nausea  . Emesis  . Dizziness   Emesis  This is a new problem. The current episode started 1 to 4 weeks ago. The problem occurs 5 to 10 times per day. The problem has been unchanged. There has been no fever. Associated symptoms include dizziness and headaches. Pertinent negatives include no abdominal pain.  Dizziness Associated symptoms include headaches and vomiting. Pertinent negatives include no abdominal pain or weakness.  Headache  This is a new problem. The current episode started today. The problem has been unchanged. The pain is located in the frontal region. The pain does not radiate. The pain is at a severity of 7/10. Associated symptoms include dizziness and vomiting. Pertinent negatives include no abdominal pain, blurred vision, phonophobia or weakness.  She does not remember how she is taking her medicines, but she is taking one pill in the morning and at night (she thinks its the Reglan); she takes another pill every 6 hours as needed (she does not remember what that one is called), and then at night she takes unisom and b6.    OB History    Gravida  4   Para  1   Term  1   Preterm  0   AB  1   Living  1     SAB  0   TAB  1   Ectopic  0   Multiple  0   Live Births  1           Past Medical History:  Diagnosis Date  . Anemia 2009  . Anxiety   . Back pain 2010  . Exposure to x-rays 2012   normal  . H/O candidiasis   . H/O varicella    . History of gastroesophageal reflux (GERD) 2010   taking with tums, helps   . Hypotension   . Migraines 2009  . Sleep apnea 2012    Past Surgical History:  Procedure Laterality Date  . COLPOSCOPY  04/02/2011  . NO PAST SURGERIES      Family History  Problem Relation Age of Onset  . Asthma Mother   . Cancer Mother 20       breast, cervix and bone and liver  . Breast cancer Mother 89  . Asthma Father   . Cancer Other   . Heart disease Other   . Stroke Other   . Thyroid disease Other   . Diabetes Other   . Hypertension Other   . Hyperlipidemia Other   . Hypertension Maternal Aunt   . Heart disease Maternal Grandmother   . Hypertension Maternal Grandmother   . Cancer Maternal Grandmother   . Breast cancer Maternal Grandmother   . Hypertension Maternal Grandfather   . Breast cancer Maternal Aunt   . Anesthesia problems Neg Hx     Social History   Tobacco Use  . Smoking status: Never Smoker  . Smokeless tobacco: Never Used  Substance Use Topics  .  Alcohol use: No  . Drug use: No    Allergies:  Allergies  Allergen Reactions  . Other Anxiety and Rash    Medication for sleep given while inpatient; doesn't remember name, no matching medications listed on chart history.  Possibly ambien, lunesta, benadryl, doxylamine.    Medications Prior to Admission  Medication Sig Dispense Refill Last Dose  . amitriptyline (ELAVIL) 25 MG tablet Take by mouth 25 mg nightly for first week, then 50 mg nighty for second week, then 75 mg nightly (Patient not taking: Reported on 11/17/2017) 90 tablet 0   . magic mouthwash w/lidocaine SOLN Take 5 mLs by mouth 3 (three) times daily as needed for mouth pain. (Patient not taking: Reported on 11/17/2017) 100 mL 0   . omeprazole (PRILOSEC) 40 MG capsule Take 1 capsule (40 mg total) by mouth daily. (Patient not taking: Reported on 11/17/2017) 14 capsule 0   . ondansetron (ZOFRAN ODT) 4 MG disintegrating tablet Take 1 tablet (4 mg total) by  mouth every 8 (eight) hours as needed for nausea or vomiting. (Patient not taking: Reported on 11/17/2017) 20 tablet 0   . Vitamin D, Ergocalciferol, (DRISDOL) 50000 units CAPS capsule Take 1 capsule (50,000 Units total) by mouth every 7 (seven) days. For 8 weeks (Patient not taking: Reported on 11/17/2017) 8 capsule 0     Review of Systems  Constitutional: Negative.   HENT: Negative.   Eyes: Negative for blurred vision.  Respiratory: Negative.   Cardiovascular: Negative.   Gastrointestinal: Positive for vomiting. Negative for abdominal pain.  Genitourinary: Negative.   Musculoskeletal: Negative.   Neurological: Positive for dizziness and headaches. Negative for weakness.   Physical Exam   Blood pressure 115/74, pulse 72, temperature 98.2 F (36.8 C), temperature source Oral, resp. rate 19, weight 72 kg, last menstrual period 10/25/2017.  Physical Exam  Constitutional: She is oriented to person, place, and time. She appears well-developed.  HENT:  Head: Normocephalic.  Neck: Normal range of motion.  GI: Soft.  Musculoskeletal: Normal range of motion.  Neurological: She is alert and oriented to person, place, and time.  Skin: Skin is warm and dry.    MAU Course  Procedures  MDM -UA positive for ketones; will start IV and give HA cocktail.  0216: patient has had Zofran, HA cocktail. HA is resolved; will try PO challenge.  0255: Patient feels much better, was able to tolerate PO challenge of ginger ale.  -Discussed medicine with Dr. Charlesetta Garibaldi, who reports that patient has been prescribed phenergan, Diclegis, scop patch, and reglan.  Assessment and Plan   1. Hyperemesis gravidarum   - Patient to continue home regimen, but add Zofran dissolving tablets, phenergan at night and docusate sodium. Detailed instructions on how to take medicine given; importance of keeping up with her regimen and of preventing constipation.  -Continue scop patch.  -Detailed diet instructions  given -Return precautions given.     Mervyn Skeeters Robey Massmann 07/14/2018, 11:13 PM

## 2018-07-15 DIAGNOSIS — O210 Mild hyperemesis gravidarum: Secondary | ICD-10-CM | POA: Diagnosis not present

## 2018-07-15 DIAGNOSIS — Z3A33 33 weeks gestation of pregnancy: Secondary | ICD-10-CM | POA: Diagnosis not present

## 2018-07-15 LAB — COMPREHENSIVE METABOLIC PANEL
ALT: 11 U/L (ref 0–44)
AST: 16 U/L (ref 15–41)
Albumin: 3.7 g/dL (ref 3.5–5.0)
Alkaline Phosphatase: 42 U/L (ref 38–126)
Anion gap: 13 (ref 5–15)
BUN: 5 mg/dL — ABNORMAL LOW (ref 6–20)
CO2: 21 mmol/L — ABNORMAL LOW (ref 22–32)
Calcium: 9 mg/dL (ref 8.9–10.3)
Chloride: 102 mmol/L (ref 98–111)
Creatinine, Ser: 0.5 mg/dL (ref 0.44–1.00)
GFR calc Af Amer: >60 mL/min (ref >60)
GFR calc non Af Amer: >60 mL/min (ref >60)
Glucose, Bld: 90 mg/dL (ref 70–99)
Potassium: 3.5 mmol/L (ref 3.5–5.1)
Sodium: 136 mmol/L (ref 135–145)
Total Bilirubin: 1.3 mg/dL — ABNORMAL HIGH (ref 0.3–1.2)
Total Protein: 7.2 g/dL (ref 6.5–8.1)

## 2018-07-15 LAB — CBC
HCT: 36.9 % (ref 36.0–46.0)
Hemoglobin: 12.2 g/dL (ref 12.0–15.0)
MCH: 28.4 pg (ref 26.0–34.0)
MCHC: 33.1 g/dL (ref 30.0–36.0)
MCV: 86 fL (ref 80.0–100.0)
Platelets: 224 10*3/uL (ref 150–400)
RBC: 4.29 MIL/uL (ref 3.87–5.11)
RDW: 12.8 % (ref 11.5–15.5)
WBC: 7.7 10*3/uL (ref 4.0–10.5)
nRBC: 0 % (ref 0.0–0.2)

## 2018-07-15 MED ORDER — DOCUSATE SODIUM 100 MG PO CAPS: 100.0000 mg | ORAL_CAPSULE | Freq: Three times a day (TID) | ORAL | 0 refills | Status: DC

## 2018-07-15 MED ORDER — ONDANSETRON 8 MG PO TBDP: 8.0000 mg | ORAL_TABLET | Freq: Three times a day (TID) | ORAL | 1 refills | Status: DC | PRN

## 2018-07-15 MED ORDER — PROMETHAZINE HCL 25 MG RE SUPP: 25.0000 mg | Freq: Four times a day (QID) | RECTAL | 1 refills | Status: DC | PRN

## 2018-07-15 MED ORDER — DIPHENHYDRAMINE HCL 50 MG/ML IJ SOLN
25.0000 mg | Freq: Once | INTRAMUSCULAR | Status: AC
  Administered 2018-07-15: 25 mg via INTRAVENOUS
  Filled 2018-07-15: qty 1

## 2018-07-15 MED ORDER — DEXAMETHASONE SODIUM PHOSPHATE 10 MG/ML IJ SOLN
10.0000 mg | Freq: Once | INTRAMUSCULAR | Status: AC
  Administered 2018-07-15: 10 mg via INTRAVENOUS
  Filled 2018-07-15: qty 1

## 2018-07-15 MED ORDER — METOCLOPRAMIDE HCL 5 MG/ML IJ SOLN
10.0000 mg | Freq: Once | INTRAMUSCULAR | Status: AC
  Administered 2018-07-15: 10 mg via INTRAVENOUS
  Filled 2018-07-15: qty 2

## 2018-07-15 MED ORDER — LACTATED RINGERS IV SOLN
INTRAVENOUS | Status: DC
  Administered 2018-07-15: 01:00:00 via INTRAVENOUS

## 2018-07-15 NOTE — Discharge Instructions (Signed)
-take promethazine suppositories at at night (in the rectum or in the vagina) -Take odansetran under your tongue; then eat 30 minutes later and take colace with your meals.  -continue to take metroclopramide every 6 hours.  -continue to take Vitamin b6 and Unisom at night.  -eat high protein snack in the morning first thing.  -call Ob-gyn if you cannot keep down liquids; small meals and snacks throughout the day are best.   Hyperemesis Gravidarum Hyperemesis gravidarum is a severe form of nausea and vomiting that happens during pregnancy. Hyperemesis is worse than morning sickness. It may cause you to have nausea or vomiting all day for many days. It may keep you from eating and drinking enough food and liquids, which can lead to dehydration, malnutrition, and weight loss. Hyperemesis usually occurs during the first half (the first 20 weeks) of pregnancy. It often goes away once a woman is in her second half of pregnancy. However, sometimes hyperemesis continues through an entire pregnancy. What are the causes? The cause of this condition is not known. It may be related to changes in chemicals (hormones) in the body during pregnancy, such as the high level of pregnancy hormone (human chorionic gonadotropin) or the increase in the female sex hormone (estrogen). What are the signs or symptoms? Symptoms of this condition include:  Nausea that does not go away.  Vomiting that does not allow you to keep any food down.  Weight loss.  Body fluid loss (dehydration).  Having no desire to eat, or not liking food that you have previously enjoyed. How is this diagnosed? This condition may be diagnosed based on:  A physical exam.  Your medical history.  Your symptoms.  Blood tests.  Urine tests. How is this treated? This condition is managed by controlling symptoms. This may include:  Following an eating plan. This can help lessen nausea and vomiting.  Taking prescription medicines. An  eating plan and medicines are often used together to help control symptoms. If medicines do not help relieve nausea and vomiting, you may need to receive fluids through an IV at the hospital. Follow these instructions at home: Eating and drinking   Avoid the following: ? Drinking fluids with meals. Try not to drink anything during the 30 minutes before and after your meals. ? Drinking more than 1 cup of fluid at a time. ? Eating foods that trigger your symptoms. These may include spicy foods, coffee, high-fat foods, very sweet foods, and acidic foods. ? Skipping meals. Nausea can be more intense on an empty stomach. If you cannot tolerate food, do not force it. Try sucking on ice chips or other frozen items and make up for missed calories later. ? Lying down within 2 hours after eating. ? Being exposed to environmental triggers. These may include food smells, smoky rooms, closed spaces, rooms with strong smells, warm or humid places, overly loud and noisy rooms, and rooms with motion or flickering lights. Try eating meals in a well-ventilated area that is free of strong smells. ? Quick and sudden changes in your movement. ? Taking iron pills and multivitamins that contain iron. If you take prescription iron pills, do not stop taking them unless your health care provider approves. ? Preparing food. The smell of food can spoil your appetite or trigger nausea.  To help relieve your symptoms: ? Listen to your body. Everyone is different and has different preferences. Find what works best for you. ? Eat and drink slowly. ? Eat 5-6 small meals  daily instead of 3 large meals. Eating small meals and snacks can help you avoid an empty stomach. ? In the morning, before getting out of bed, eat a couple of crackers to avoid moving around on an empty stomach. ? Try eating starchy foods as these are usually tolerated well. Examples include cereal, toast, bread, potatoes, pasta, rice, and pretzels. ? Include  at least 1 serving of protein with your meals and snacks. Protein options include lean meats, poultry, seafood, beans, nuts, nut butters, eggs, cheese, and yogurt. ? Try eating a protein-rich snack before bed. Examples of a protein-rick snack include cheese and crackers or a peanut butter sandwich made with 1 slice of whole-wheat bread and 1 tsp (5 g) of peanut butter. ? Eat or suck on things that have ginger in them. It may help relieve nausea. Add  tsp ground ginger to hot tea or choose ginger tea. ? Try drinking 100% fruit juice or an electrolyte drink. An electrolyte drink contains sodium, potassium, and chloride. ? Drink fluids that are cold, clear, and carbonated or sour. Examples include lemonade, ginger ale, lemon-lime soda, ice water, and sparkling water. ? Brush your teeth or use a mouth rinse after meals. ? Talk with your health care provider about starting a supplement of vitamin B6. General instructions  Take over-the-counter and prescription medicines only as told by your health care provider.  Follow instructions from your health care provider about eating or drinking restrictions.  Continue to take your prenatal vitamins as told by your health care provider. If you are having trouble taking your prenatal vitamins, talk with your health care provider about different options.  Keep all follow-up and pre-birth (prenatal) visits as told by your health care provider. This is important. Contact a health care provider if:  You have pain in your abdomen.  You have a severe headache.  You have vision problems.  You are losing weight.  You feel weak or dizzy. Get help right away if:  You cannot drink fluids without vomiting.  You vomit blood.  You have constant nausea and vomiting.  You are very weak.  You faint.  You have a fever and your symptoms suddenly get worse. Summary  Hyperemesis gravidarum is a severe form of nausea and vomiting that happens during  pregnancy.  Making some changes to your eating habits may help relieve nausea and vomiting.  This condition may be managed with medicine.  If medicines do not help relieve nausea and vomiting, you may need to receive fluids through an IV at the hospital. This information is not intended to replace advice given to you by your health care provider. Make sure you discuss any questions you have with your health care provider. Document Released: 12/23/2004 Document Revised: 01/12/2017 Document Reviewed: 08/22/2015 Elsevier Patient Education  2020 [employer].

## 2018-07-20 ENCOUNTER — Inpatient Hospital Stay (HOSPITAL_COMMUNITY)
Admission: AD | Admit: 2018-07-20 | Discharge: 2018-07-20 | Disposition: A | Discharge status: Home / Self Care | Payer: Medicaid Other | Attending: Obstetrics & Gynecology | Admitting: Obstetrics & Gynecology

## 2018-07-20 ENCOUNTER — Other Ambulatory Visit: Payer: Self-pay

## 2018-07-20 ENCOUNTER — Encounter (HOSPITAL_COMMUNITY): Payer: Self-pay

## 2018-07-20 DIAGNOSIS — Z3481 Encounter for supervision of other normal pregnancy, first trimester: Secondary | ICD-10-CM | POA: Diagnosis not present

## 2018-07-20 DIAGNOSIS — Z3A10 10 weeks gestation of pregnancy: Secondary | ICD-10-CM | POA: Diagnosis not present

## 2018-07-20 DIAGNOSIS — Z3491 Encounter for supervision of normal pregnancy, unspecified, first trimester: Secondary | ICD-10-CM | POA: Diagnosis not present

## 2018-07-20 DIAGNOSIS — E860 Dehydration: Secondary | ICD-10-CM

## 2018-07-20 DIAGNOSIS — R112 Nausea with vomiting, unspecified: Secondary | ICD-10-CM

## 2018-07-20 DIAGNOSIS — O210 Mild hyperemesis gravidarum: Secondary | ICD-10-CM | POA: Insufficient documentation

## 2018-07-20 LAB — URINALYSIS, ROUTINE W REFLEX MICROSCOPIC
Bilirubin Urine: NEGATIVE
Glucose, UA: NEGATIVE mg/dL
Ketones, ur: NEGATIVE mg/dL
Nitrite: NEGATIVE
Protein, ur: 30 mg/dL — AB
Specific Gravity, Urine: 1.025 (ref 1.005–1.030)
pH: 5 (ref 5.0–8.0)

## 2018-07-20 MED ORDER — LACTATED RINGERS IV BOLUS
1000.0000 mL | Freq: Once | INTRAVENOUS | Status: AC
  Administered 2018-07-20: 1000 mL via INTRAVENOUS

## 2018-07-20 MED ORDER — FAMOTIDINE IN NACL 20-0.9 MG/50ML-% IV SOLN
20.0000 mg | Freq: Once | INTRAVENOUS | Status: AC
  Administered 2018-07-20: 21:00:00 20 mg via INTRAVENOUS
  Filled 2018-07-20: qty 50

## 2018-07-20 MED ORDER — ONDANSETRON HCL 4 MG/2ML IJ SOLN
4.0000 mg | Freq: Once | INTRAMUSCULAR | Status: AC
  Administered 2018-07-20: 4 mg via INTRAVENOUS
  Filled 2018-07-20: qty 2

## 2018-07-20 NOTE — MAU Provider Note (Addendum)
[id number] Complaint: Dehydration   First Provider Initiated Contact with Patient 07/20/18 2045     SUBJECTIVE HPI: [name] [name] is a 33 y.o. G4P1011 at [redacted]w[redacted]d who presents to Maternity Admissions reporting n/v. She goes to [facility]. Saw Jocelyn Lamer today in the office & was told to come here for IV fluids due to dehydration. Patient reports n/v for the last month. Has been taking zofran & phenergan with minimal relief. Vomits more than 4 times per day & states she can't keep down any food or fluids.  Denies abdominal pain, vaginal bleeding, fever/chills, or diarrhea.   Past Medical History:  Diagnosis Date  . Anemia 2009  . Anxiety   . Back pain 2010  . Exposure to x-rays 2012   normal  . H/O candidiasis   . H/O varicella   . History of gastroesophageal reflux (GERD) 2010   taking with tums, helps   . Hypotension   . Migraines 2009  . Sleep apnea 2012   OB History  Gravida Para Term Preterm AB Living  4 1 1  0 1 1  SAB TAB Ectopic Multiple Live Births  0 1 0 0 1    # Outcome Date GA Lbr Len/2nd Weight Sex Delivery Anes PTL Lv  4 Current           3 Term 10/14/11 [redacted]w[redacted]d  3274 g F Vag-Spont EPI  LIV  2 TAB           1 Gravida              Birth Comments: System Generated. Please review and update pregnancy details.   Past Surgical History:  Procedure Laterality Date  . COLPOSCOPY  04/02/2011  . NO PAST SURGERIES     Social History   Socioeconomic History  . Marital status: Married    Spouse name: Not on file  . Number of children: Not on file  . Years of education: Not on file  . Highest education level: Not on file  Occupational History  . Occupation: unemployed   Social Needs  . Financial resource strain: Not on file  . Food insecurity    Worry: Not on file    Inability: Not on file  . Transportation needs    Medical: Not on file    Non-medical: Not on file  Tobacco Use  . Smoking status: Never Smoker  . Smokeless tobacco: Never Used  Substance and Sexual Activity   . Alcohol use: No  . Drug use: No  . Sexual activity: Not Currently    Birth control/protection: None  Lifestyle  . Physical activity    Days per week: Not on file    Minutes per session: Not on file  . Stress: Not on file  Relationships  . Social Herbalist on phone: Not on file    Gets together: Not on file    Attends religious service: Not on file    Active member of club or organization: Not on file    Attends meetings of clubs or organizations: Not on file    Relationship status: Not on file  . Intimate partner violence    Fear of current or ex partner: Not on file    Emotionally abused: Not on file    Physically abused: Not on file    Forced sexual activity: Not on file  Other Topics Concern  . Not on file  Social History Narrative   Moved to [place] from [place] 6 months ago.  Married, husband age 67.   Muslim-no pork, needs female provider.    Mom is actively involved with patient. She has been here for 25 years.    Family History  Problem Relation Age of Onset  . Asthma Mother   . Cancer Mother 36       breast, cervix and bone and liver  . Breast cancer Mother 44  . Asthma Father   . Cancer Other   . Heart disease Other   . Stroke Other   . Thyroid disease Other   . Diabetes Other   . Hypertension Other   . Hyperlipidemia Other   . Hypertension Maternal Aunt   . Heart disease Maternal Grandmother   . Hypertension Maternal Grandmother   . Cancer Maternal Grandmother   . Breast cancer Maternal Grandmother   . Hypertension Maternal Grandfather   . Breast cancer Maternal Aunt   . Anesthesia problems Neg Hx    No current facility-administered medications on file prior to encounter.    Current Outpatient Medications on File Prior to Encounter  Medication Sig Dispense Refill  . omeprazole (PRILOSEC) 40 MG capsule Take 1 capsule (40 mg total) by mouth daily. 14 capsule 0  . ondansetron (ZOFRAN ODT) 8 MG disintegrating tablet Take 1 tablet (8  mg total) by mouth every 8 (eight) hours as needed for nausea or vomiting. 60 tablet 1  . docusate sodium (COLACE) 100 MG capsule Take 1 capsule (100 mg total) by mouth 3 (three) times daily with meals. 60 capsule 0  . promethazine (PHENERGAN) 25 MG suppository Place 1 suppository (25 mg total) rectally every 6 (six) hours as needed for nausea or vomiting. 12 each 1  . Vitamin D, Ergocalciferol, (DRISDOL) 50000 units CAPS capsule Take 1 capsule (50,000 Units total) by mouth every 7 (seven) days. For 8 weeks (Patient not taking: Reported on 11/17/2017) 8 capsule 0   Allergies  Allergen Reactions  . Other Anxiety and Rash    Medication for sleep given while inpatient; doesn't remember name, no matching medications listed on chart history.  Possibly ambien, lunesta, benadryl, doxylamine.    I have reviewed patient's Past Medical Hx, Surgical Hx, Family Hx, Social Hx, medications and allergies.   Review of Systems  Constitutional: Negative.   Gastrointestinal: Positive for nausea and vomiting. Negative for abdominal pain, constipation and diarrhea.  Genitourinary: Negative.     OBJECTIVE Patient Vitals for the past 24 hrs:  BP Temp Temp src Pulse Resp SpO2 Weight  07/20/18 2040 - - - - - 98 % -  07/20/18 2035 120/70 - - 82 16 - -  07/20/18 2021 116/69 98.9 F (37.2 C) Oral 86 17 - 71 kg   Constitutional: Well-developed, well-nourished female in no acute distress.  Cardiovascular: normal rate & rhythm, no murmur Respiratory: normal rate and effort. Lung sounds clear throughout GI: Abd soft, non-tender, Pos BS x 4. No guarding or rebound tenderness MS: Extremities nontender, no edema, normal ROM Neurologic: Alert and oriented x 4.      LAB RESULTS Results for orders placed or performed during the hospital encounter of 07/20/18 (from the past 24 hour(s))  Urinalysis, Routine w reflex microscopic     Status: Abnormal   Collection Time: 07/20/18  8:23 PM  Result Value Ref Range    Color, Urine AMBER (A) YELLOW   APPearance CLOUDY (A) CLEAR   Specific Gravity, Urine 1.025 1.005 - 1.030   pH 5.0 5.0 - 8.0   Glucose, UA NEGATIVE NEGATIVE mg/dL  Hgb urine dipstick SMALL (A) NEGATIVE   Bilirubin Urine NEGATIVE NEGATIVE   Ketones, ur NEGATIVE NEGATIVE mg/dL   Protein, ur 30 (A) NEGATIVE mg/dL   Nitrite NEGATIVE NEGATIVE   Leukocytes,Ua TRACE (A) NEGATIVE   RBC / HPF 6-10 0 - 5 RBC/hpf   WBC, UA 0-5 0 - 5 WBC/hpf   Bacteria, UA RARE (A) NONE SEEN   Squamous Epithelial / LPF 11-20 0 - 5   Mucus PRESENT     IMAGING No results found.  MAU COURSE Orders Placed This Encounter  Procedures  . Urinalysis, Routine w reflex microscopic   Meds ordered this encounter  Medications  . lactated ringers bolus 1,000 mL  . ondansetron (ZOFRAN) injection 4 mg  . famotidine (PEPCID) IVPB 20 mg premix    MDM FHT present via doppler  IV LR bolus. IV zofran & pepcid given.   Care turned over to [place], [place], NP 07/20/2018  9:59 PM  Felt much better after fluids Able to keep down PO intake Has full prescriptions for all her meds at home Will discharge home  Seabron Spates, CNM

## 2018-07-20 NOTE — Discharge Instructions (Signed)
Hyperemesis Gravidarum Hyperemesis gravidarum is a severe form of nausea and vomiting that happens during pregnancy. Hyperemesis is worse than morning sickness. It may cause you to have nausea or vomiting all day for many days. It may keep you from eating and drinking enough food and liquids, which can lead to dehydration, malnutrition, and weight loss. Hyperemesis usually occurs during the first half (the first 20 weeks) of pregnancy. It often goes away once a woman is in her second half of pregnancy. However, sometimes hyperemesis continues through an entire pregnancy. What are the causes? The cause of this condition is not known. It may be related to changes in chemicals (hormones) in the body during pregnancy, such as the high level of pregnancy hormone (human chorionic gonadotropin) or the increase in the female sex hormone (estrogen). What are the signs or symptoms? Symptoms of this condition include:  Nausea that does not go away.  Vomiting that does not allow you to keep any food down.  Weight loss.  Body fluid loss (dehydration).  Having no desire to eat, or not liking food that you have previously enjoyed. How is this diagnosed? This condition may be diagnosed based on:  A physical exam.  Your medical history.  Your symptoms.  Blood tests.  Urine tests. How is this treated? This condition is managed by controlling symptoms. This may include:  Following an eating plan. This can help lessen nausea and vomiting.  Taking prescription medicines. An eating plan and medicines are often used together to help control symptoms. If medicines do not help relieve nausea and vomiting, you may need to receive fluids through an IV at the hospital. Follow these instructions at home: Eating and drinking   Avoid the following: ? Drinking fluids with meals. Try not to drink anything during the 30 minutes before and after your meals. ? Drinking more than 1 cup of fluid at a  time. ? Eating foods that trigger your symptoms. These may include spicy foods, coffee, high-fat foods, very sweet foods, and acidic foods. ? Skipping meals. Nausea can be more intense on an empty stomach. If you cannot tolerate food, do not force it. Try sucking on ice chips or other frozen items and make up for missed calories later. ? Lying down within 2 hours after eating. ? Being exposed to environmental triggers. These may include food smells, smoky rooms, closed spaces, rooms with strong smells, warm or humid places, overly loud and noisy rooms, and rooms with motion or flickering lights. Try eating meals in a well-ventilated area that is free of strong smells. ? Quick and sudden changes in your movement. ? Taking iron pills and multivitamins that contain iron. If you take prescription iron pills, do not stop taking them unless your health care provider approves. ? Preparing food. The smell of food can spoil your appetite or trigger nausea.  To help relieve your symptoms: ? Listen to your body. Everyone is different and has different preferences. Find what works best for you. ? Eat and drink slowly. ? Eat 5-6 small meals daily instead of 3 large meals. Eating small meals and snacks can help you avoid an empty stomach. ? In the morning, before getting out of bed, eat a couple of crackers to avoid moving around on an empty stomach. ? Try eating starchy foods as these are usually tolerated well. Examples include cereal, toast, bread, potatoes, pasta, rice, and pretzels. ? Include at least 1 serving of protein with your meals and snacks. Protein options include  lean meats, poultry, seafood, beans, nuts, nut butters, eggs, cheese, and yogurt. ? Try eating a protein-rich snack before bed. Examples of a protein-rick snack include cheese and crackers or a peanut butter sandwich made with 1 slice of whole-wheat bread and 1 tsp (5 g) of peanut butter. ? Eat or suck on things that have ginger in them.  It may help relieve nausea. Add  tsp ground ginger to hot tea or choose ginger tea. ? Try drinking 100% fruit juice or an electrolyte drink. An electrolyte drink contains sodium, potassium, and chloride. ? Drink fluids that are cold, clear, and carbonated or sour. Examples include lemonade, ginger ale, lemon-lime soda, ice water, and sparkling water. ? Brush your teeth or use a mouth rinse after meals. ? Talk with your health care provider about starting a supplement of vitamin B6. General instructions  Take over-the-counter and prescription medicines only as told by your health care provider.  Follow instructions from your health care provider about eating or drinking restrictions.  Continue to take your prenatal vitamins as told by your health care provider. If you are having trouble taking your prenatal vitamins, talk with your health care provider about different options.  Keep all follow-up and pre-birth (prenatal) visits as told by your health care provider. This is important. Contact a health care provider if:  You have pain in your abdomen.  You have a severe headache.  You have vision problems.  You are losing weight.  You feel weak or dizzy. Get help right away if:  You cannot drink fluids without vomiting.  You vomit blood.  You have constant nausea and vomiting.  You are very weak.  You faint.  You have a fever and your symptoms suddenly get worse. Summary  Hyperemesis gravidarum is a severe form of nausea and vomiting that happens during pregnancy.  Making some changes to your eating habits may help relieve nausea and vomiting.  This condition may be managed with medicine.  If medicines do not help relieve nausea and vomiting, you may need to receive fluids through an IV at the hospital. This information is not intended to replace advice given to you by your health care provider. Make sure you discuss any questions you have with your health care  provider. Document Released: 12/23/2004 Document Revised: 01/12/2017 Document Reviewed: 08/22/2015 Elsevier Patient Education  2020 [employer].

## 2018-07-20 NOTE — MAU Note (Signed)
Pt reports to MAU stating she was at the doctor today and they did blood work and told her she was dehydrated and she needed to come to the hospital and get IV fluids and Zofran and IV pt states the provider said she can fax orders? Pt reports being unable to eat or drink.

## 2018-08-08 ENCOUNTER — Other Ambulatory Visit: Payer: Self-pay

## 2018-08-08 ENCOUNTER — Inpatient Hospital Stay (HOSPITAL_COMMUNITY)
Admission: AD | Admit: 2018-08-08 | Discharge: 2018-08-08 | Disposition: A | Discharge status: Home / Self Care | Payer: Medicaid Other | Attending: Obstetrics and Gynecology | Admitting: Obstetrics and Gynecology

## 2018-08-08 ENCOUNTER — Encounter (HOSPITAL_COMMUNITY): Payer: Self-pay | Admitting: *Deleted

## 2018-08-08 DIAGNOSIS — O219 Vomiting of pregnancy, unspecified: Secondary | ICD-10-CM

## 2018-08-08 DIAGNOSIS — R1032 Left lower quadrant pain: Secondary | ICD-10-CM | POA: Insufficient documentation

## 2018-08-08 DIAGNOSIS — R1031 Right lower quadrant pain: Secondary | ICD-10-CM | POA: Diagnosis not present

## 2018-08-08 DIAGNOSIS — Z3A13 13 weeks gestation of pregnancy: Secondary | ICD-10-CM

## 2018-08-08 DIAGNOSIS — O2241 Hemorrhoids in pregnancy, first trimester: Secondary | ICD-10-CM | POA: Diagnosis not present

## 2018-08-08 DIAGNOSIS — K5900 Constipation, unspecified: Secondary | ICD-10-CM

## 2018-08-08 DIAGNOSIS — O99611 Diseases of the digestive system complicating pregnancy, first trimester: Secondary | ICD-10-CM | POA: Insufficient documentation

## 2018-08-08 DIAGNOSIS — O210 Mild hyperemesis gravidarum: Secondary | ICD-10-CM | POA: Insufficient documentation

## 2018-08-08 DIAGNOSIS — O26891 Other specified pregnancy related conditions, first trimester: Secondary | ICD-10-CM | POA: Insufficient documentation

## 2018-08-08 DIAGNOSIS — M549 Dorsalgia, unspecified: Secondary | ICD-10-CM | POA: Insufficient documentation

## 2018-08-08 LAB — CBC WITH DIFFERENTIAL/PLATELET
Abs Immature Granulocytes: 0.01 10*3/uL (ref 0.00–0.07)
Basophils Absolute: 0 10*3/uL (ref 0.0–0.1)
Basophils Relative: 0 %
Eosinophils Absolute: 0 10*3/uL (ref 0.0–0.5)
Eosinophils Relative: 1 %
HCT: 33.5 % — ABNORMAL LOW (ref 36.0–46.0)
Hemoglobin: 10.9 g/dL — ABNORMAL LOW (ref 12.0–15.0)
Immature Granulocytes: 0 %
Lymphocytes Relative: 25 %
Lymphs Abs: 1.4 10*3/uL (ref 0.7–4.0)
MCH: 27.9 pg (ref 26.0–34.0)
MCHC: 32.5 g/dL (ref 30.0–36.0)
MCV: 85.9 fL (ref 80.0–100.0)
Monocytes Absolute: 0.3 10*3/uL (ref 0.1–1.0)
Monocytes Relative: 5 %
Neutro Abs: 3.8 10*3/uL (ref 1.7–7.7)
Neutrophils Relative %: 69 %
Platelets: 189 10*3/uL (ref 150–400)
RBC: 3.9 MIL/uL (ref 3.87–5.11)
RDW: 12.8 % (ref 11.5–15.5)
WBC: 5.5 10*3/uL (ref 4.0–10.5)
nRBC: 0 % (ref 0.0–0.2)

## 2018-08-08 LAB — COMPREHENSIVE METABOLIC PANEL
ALT: 14 U/L (ref 0–44)
AST: 18 U/L (ref 15–41)
Albumin: 3.3 g/dL — ABNORMAL LOW (ref 3.5–5.0)
Alkaline Phosphatase: 43 U/L (ref 38–126)
Anion gap: 8 (ref 5–15)
BUN: <5 mg/dL — ABNORMAL LOW (ref 6–20)
CO2: 20 mmol/L — ABNORMAL LOW (ref 22–32)
Calcium: 8.6 mg/dL — ABNORMAL LOW (ref 8.9–10.3)
Chloride: 106 mmol/L (ref 98–111)
Creatinine, Ser: 0.34 mg/dL — ABNORMAL LOW (ref 0.44–1.00)
GFR calc Af Amer: >60 mL/min (ref >60)
GFR calc non Af Amer: >60 mL/min (ref >60)
Glucose, Bld: 91 mg/dL (ref 70–99)
Potassium: 3.8 mmol/L (ref 3.5–5.1)
Sodium: 134 mmol/L — ABNORMAL LOW (ref 135–145)
Total Bilirubin: 0.5 mg/dL (ref 0.3–1.2)
Total Protein: 6.6 g/dL (ref 6.5–8.1)

## 2018-08-08 LAB — URINALYSIS, ROUTINE W REFLEX MICROSCOPIC
Bacteria, UA: NONE SEEN
Bilirubin Urine: NEGATIVE
Glucose, UA: NEGATIVE mg/dL
Ketones, ur: NEGATIVE mg/dL
Leukocytes,Ua: NEGATIVE
Nitrite: NEGATIVE
Protein, ur: NEGATIVE mg/dL
Specific Gravity, Urine: 1.017 (ref 1.005–1.030)
pH: 6 (ref 5.0–8.0)

## 2018-08-08 MED ORDER — HYDROCORTISONE 1 % EX CREA: TOPICAL_CREAM | CUTANEOUS | 0 refills | Status: DC

## 2018-08-08 MED ORDER — PROMETHAZINE HCL 25 MG/ML IJ SOLN
25.0000 mg | Freq: Once | INTRAVENOUS | Status: AC
  Administered 2018-08-08: 25 mg via INTRAVENOUS
  Filled 2018-08-08: qty 1

## 2018-08-08 MED ORDER — TUCKS 50 % EX PADS: 1.0000 | MEDICATED_PAD | Freq: Every day | CUTANEOUS | 1 refills | Status: DC

## 2018-08-08 NOTE — Discharge Instructions (Signed)

## 2018-08-08 NOTE — MAU Provider Note (Addendum)
History     CSN: [id number]  Arrival date and time: 08/08/18 1656   First Provider Initiated Contact with Patient 08/08/18 1742      Chief Complaint  Patient presents with  . Back Pain  . Abdominal Pain  . Nausea  . Emesis   HPI [name] is a 33 y.o. G4P1011 at [redacted]w[redacted]d who presents to MAU today with complaint of ongoing N/V today. She has Zofran and Phenergan at home but has not taken either today. She states that they are making her severely constipated. She had a very small BM yesterday and noted blood on the tissue. She is having bilateral lower abdominal pain and back pain. She has not taken any pain medicine. She denies vaginal bleeding.   OB History    Gravida  4   Para  1   Term  1   Preterm  0   AB  1   Living  1     SAB  0   TAB  1   Ectopic  0   Multiple  0   Live Births  1           Past Medical History:  Diagnosis Date  . Anemia 2009  . Anxiety   . Back pain 2010  . Exposure to x-rays 2012   normal  . H. pylori infection 2019  . H/O candidiasis   . H/O varicella   . History of gastroesophageal reflux (GERD) 2010   taking with tums, helps   . Hypotension   . Migraines 2009  . Sleep apnea 2012    Past Surgical History:  Procedure Laterality Date  . COLPOSCOPY  04/02/2011  . NO PAST SURGERIES      Family History  Problem Relation Age of Onset  . Asthma Mother   . Cancer Mother 12       breast, cervix and bone and liver  . Breast cancer Mother 62  . Asthma Father   . Cancer Other   . Heart disease Other   . Stroke Other   . Thyroid disease Other   . Diabetes Other   . Hypertension Other   . Hyperlipidemia Other   . Hypertension Maternal Aunt   . Heart disease Maternal Grandmother   . Hypertension Maternal Grandmother   . Cancer Maternal Grandmother   . Breast cancer Maternal Grandmother   . Hypertension Maternal Grandfather   . Breast cancer Maternal Aunt   . Anesthesia problems Neg Hx     Social History    Tobacco Use  . Smoking status: Never Smoker  . Smokeless tobacco: Never Used  Substance Use Topics  . Alcohol use: No  . Drug use: No    Allergies:  Allergies  Allergen Reactions  . Other Anxiety and Rash    Medication for sleep given while inpatient; doesn't remember name, no matching medications listed on chart history.  Possibly ambien, lunesta, benadryl, doxylamine.    Medications Prior to Admission  Medication Sig Dispense Refill Last Dose  . docusate sodium (COLACE) 100 MG capsule Take 1 capsule (100 mg total) by mouth 3 (three) times daily with meals. 60 capsule 0 Past Week at Unknown time  . ondansetron (ZOFRAN ODT) 8 MG disintegrating tablet Take 1 tablet (8 mg total) by mouth every 8 (eight) hours as needed for nausea or vomiting. 60 tablet 1 08/07/2018 at Unknown time  . omeprazole (PRILOSEC) 40 MG capsule Take 1 capsule (40 mg total) by mouth daily.  14 capsule 0   . promethazine (PHENERGAN) 25 MG suppository Place 1 suppository (25 mg total) rectally every 6 (six) hours as needed for nausea or vomiting. 12 each 1   . Vitamin D, Ergocalciferol, (DRISDOL) 50000 units CAPS capsule Take 1 capsule (50,000 Units total) by mouth every 7 (seven) days. For 8 weeks (Patient not taking: Reported on 11/17/2017) 8 capsule 0     Review of Systems  Constitutional: Negative for fever.  Gastrointestinal: Positive for abdominal pain, constipation, nausea and vomiting. Negative for diarrhea.  Genitourinary: Negative for dysuria, frequency, urgency, vaginal bleeding and vaginal discharge.  Musculoskeletal: Positive for back pain.   Physical Exam   Blood pressure 122/72, pulse 87, temperature 98.4 F (36.9 C), temperature source Oral, resp. rate 16, height 5\' 3"  (1.6 m), weight 68.5 kg, last menstrual period 10/25/2017, SpO2 99 %.  Physical Exam  Nursing note and vitals reviewed. Constitutional: She is oriented to person, place, and time. She appears well-developed and well-nourished.  No distress.  HENT:  Head: Normocephalic and atraumatic.  Cardiovascular: Normal rate.  Respiratory: Effort normal.  GI: Soft. She exhibits no distension. There is no abdominal tenderness.  Neurological: She is alert and oriented to person, place, and time.  Skin: Skin is warm and dry. No erythema.  Psychiatric: She has a normal mood and affect.    Results for orders placed or performed during the hospital encounter of 08/08/18 (from the past 24 hour(s))  Urinalysis, Routine w reflex microscopic     Status: Abnormal   Collection Time: 08/08/18  6:33 PM  Result Value Ref Range   Color, Urine YELLOW YELLOW   APPearance CLEAR CLEAR   Specific Gravity, Urine 1.017 1.005 - 1.030   pH 6.0 5.0 - 8.0   Glucose, UA NEGATIVE NEGATIVE mg/dL   Hgb urine dipstick SMALL (A) NEGATIVE   Bilirubin Urine NEGATIVE NEGATIVE   Ketones, ur NEGATIVE NEGATIVE mg/dL   Protein, ur NEGATIVE NEGATIVE mg/dL   Nitrite NEGATIVE NEGATIVE   Leukocytes,Ua NEGATIVE NEGATIVE   RBC / HPF 0-5 0 - 5 RBC/hpf   WBC, UA 0-5 0 - 5 WBC/hpf   Bacteria, UA NONE SEEN NONE SEEN   Squamous Epithelial / LPF 0-5 0 - 5   Mucus PRESENT   CBC with Differential/Platelet     Status: Abnormal   Collection Time: 08/08/18  6:33 PM  Result Value Ref Range   WBC 5.5 4.0 - 10.5 K/uL   RBC 3.90 3.87 - 5.11 MIL/uL   Hemoglobin 10.9 (L) 12.0 - 15.0 g/dL   HCT 33.5 (L) 36.0 - 46.0 %   MCV 85.9 80.0 - 100.0 fL   MCH 27.9 26.0 - 34.0 pg   MCHC 32.5 30.0 - 36.0 g/dL   RDW 12.8 11.5 - 15.5 %   Platelets 189 150 - 400 K/uL   nRBC 0.0 0.0 - 0.2 %   Neutrophils Relative % 69 %   Neutro Abs 3.8 1.7 - 7.7 K/uL   Lymphocytes Relative 25 %   Lymphs Abs 1.4 0.7 - 4.0 K/uL   Monocytes Relative 5 %   Monocytes Absolute 0.3 0.1 - 1.0 K/uL   Eosinophils Relative 1 %   Eosinophils Absolute 0.0 0.0 - 0.5 K/uL   Basophils Relative 0 %   Basophils Absolute 0.0 0.0 - 0.1 K/uL   Immature Granulocytes 0 %   Abs Immature Granulocytes 0.01 0.00 - 0.07  K/uL  Comprehensive metabolic panel     Status: Abnormal   Collection Time:  08/08/18  6:33 PM  Result Value Ref Range   Sodium 134 (L) 135 - 145 mmol/L   Potassium 3.8 3.5 - 5.1 mmol/L   Chloride 106 98 - 111 mmol/L   CO2 20 (L) 22 - 32 mmol/L   Glucose, Bld 91 70 - 99 mg/dL   BUN <5 (L) 6 - 20 mg/dL   Creatinine, Ser 0.34 (L) 0.44 - 1.00 mg/dL   Calcium 8.6 (L) 8.9 - 10.3 mg/dL   Total Protein 6.6 6.5 - 8.1 g/dL   Albumin 3.3 (L) 3.5 - 5.0 g/dL   AST 18 15 - 41 U/L   ALT 14 0 - 44 U/L   Alkaline Phosphatase 43 38 - 126 U/L   Total Bilirubin 0.5 0.3 - 1.2 mg/dL   GFR calc non Af Amer >60 >60 mL/min   GFR calc Af Amer >60 >60 mL/min   Anion gap 8 5 - 15    MAU Course  Procedures None  MDM UA today without evidence of dehydration  CBC, CMP without gross abnormalities  1 liter IV phenergan infusion  Patient feels nausea has improved with medication.  Still concerned about constipation. Offered an enema.  Soap suds enema ordered  2100 - care turned over to Darrol Poke, CNM   Kerry Hough, PA-C 08/08/2018, 8:58 PM   Patient reports being unable to have BM d/t hemorrhoids  Offered Rx for hemorrhoid medication for home use, discussed with patient use of miralax clean out at home to be able to have BM  Educated and discussed use of sitz bath for hemorrhoids, material given to patient  Patient able to tolerate crackers and gingerale prior to discharge home  Instructed to follow up with [facility] this week to discuss future plans with hyperemesis, patient verbalizes understanding   After discharge, patient adamant and request to be admitted for continued treatment, educated and discussed results of lab work and no evidence of need for admission at this time.  Encouraged to discussed with [facility] in the morning future plan of care, patient verbalizes understanding. Pt stable at time of discharge.  Assessment and Plan   1. Nausea and vomiting during pregnancy   2. Constipation during  pregnancy in first trimester   3. Hemorrhoids during pregnancy in first trimester    Discharge home Follow up with [facility]  Return to MAU as needed for reasons discussed and/or emergencies  Continue medication as prescribed   [facility] Follow up.   Specialty: Obstetrics and Gynecology Why: Follow up as scheduled for prenatal appointments  Contact information: [address] [place] [id number] [place] [name] [name], CNM 08/08/18, 11:50 PM

## 2018-08-08 NOTE — MAU Note (Signed)
Pt reports the smallest BM yesterday and blood with stool. Headache, nausea, dizziness and weightloss.

## 2018-09-22 DIAGNOSIS — O4402 Placenta previa specified as without hemorrhage, second trimester: Secondary | ICD-10-CM | POA: Diagnosis not present

## 2018-09-22 DIAGNOSIS — Z3402 Encounter for supervision of normal first pregnancy, second trimester: Secondary | ICD-10-CM | POA: Diagnosis not present

## 2018-09-22 DIAGNOSIS — Z363 Encounter for antenatal screening for malformations: Secondary | ICD-10-CM | POA: Diagnosis not present

## 2018-09-22 DIAGNOSIS — Z3A19 19 weeks gestation of pregnancy: Secondary | ICD-10-CM | POA: Diagnosis not present

## 2018-09-22 DIAGNOSIS — O210 Mild hyperemesis gravidarum: Secondary | ICD-10-CM | POA: Diagnosis not present

## 2018-09-22 DIAGNOSIS — K219 Gastro-esophageal reflux disease without esophagitis: Secondary | ICD-10-CM | POA: Diagnosis not present

## 2018-09-22 DIAGNOSIS — N644 Mastodynia: Secondary | ICD-10-CM | POA: Diagnosis not present

## 2018-09-22 DIAGNOSIS — Z0183 Encounter for blood typing: Secondary | ICD-10-CM | POA: Diagnosis not present

## 2018-09-29 DIAGNOSIS — N644 Mastodynia: Secondary | ICD-10-CM | POA: Diagnosis not present

## 2018-10-04 ENCOUNTER — Inpatient Hospital Stay (HOSPITAL_COMMUNITY): Payer: Medicaid Other

## 2018-10-04 ENCOUNTER — Encounter (HOSPITAL_COMMUNITY): Payer: Self-pay

## 2018-10-04 ENCOUNTER — Other Ambulatory Visit: Payer: Self-pay

## 2018-10-04 ENCOUNTER — Inpatient Hospital Stay (HOSPITAL_COMMUNITY)
Admission: AD | Admit: 2018-10-04 | Discharge: 2018-10-05 | Disposition: A | Discharge status: Home / Self Care | Payer: Medicaid Other | Attending: Emergency Medicine | Admitting: Emergency Medicine

## 2018-10-04 DIAGNOSIS — R1011 Right upper quadrant pain: Secondary | ICD-10-CM | POA: Diagnosis present

## 2018-10-04 DIAGNOSIS — R109 Unspecified abdominal pain: Secondary | ICD-10-CM | POA: Diagnosis not present

## 2018-10-04 DIAGNOSIS — R0602 Shortness of breath: Secondary | ICD-10-CM | POA: Insufficient documentation

## 2018-10-04 DIAGNOSIS — Z79899 Other long term (current) drug therapy: Secondary | ICD-10-CM | POA: Insufficient documentation

## 2018-10-04 DIAGNOSIS — R1013 Epigastric pain: Secondary | ICD-10-CM | POA: Insufficient documentation

## 2018-10-04 DIAGNOSIS — Z3A21 21 weeks gestation of pregnancy: Secondary | ICD-10-CM

## 2018-10-04 DIAGNOSIS — O26892 Other specified pregnancy related conditions, second trimester: Secondary | ICD-10-CM

## 2018-10-04 DIAGNOSIS — O219 Vomiting of pregnancy, unspecified: Secondary | ICD-10-CM | POA: Diagnosis not present

## 2018-10-04 DIAGNOSIS — R1084 Generalized abdominal pain: Secondary | ICD-10-CM | POA: Diagnosis not present

## 2018-10-04 DIAGNOSIS — O26899 Other specified pregnancy related conditions, unspecified trimester: Secondary | ICD-10-CM

## 2018-10-04 DIAGNOSIS — D252 Subserosal leiomyoma of uterus: Secondary | ICD-10-CM | POA: Diagnosis not present

## 2018-10-04 LAB — URINALYSIS, ROUTINE W REFLEX MICROSCOPIC
Bilirubin Urine: NEGATIVE
Glucose, UA: NEGATIVE mg/dL
Hgb urine dipstick: NEGATIVE
Ketones, ur: NEGATIVE mg/dL
Leukocytes,Ua: NEGATIVE
Nitrite: NEGATIVE
Protein, ur: NEGATIVE mg/dL
Specific Gravity, Urine: 1.013 (ref 1.005–1.030)
pH: 7 (ref 5.0–8.0)

## 2018-10-04 LAB — CBC WITH DIFFERENTIAL/PLATELET
Abs Immature Granulocytes: 0.05 10*3/uL (ref 0.00–0.07)
Basophils Absolute: 0 10*3/uL (ref 0.0–0.1)
Basophils Relative: 0 %
Eosinophils Absolute: 0.1 10*3/uL (ref 0.0–0.5)
Eosinophils Relative: 1 %
HCT: 32.4 % — ABNORMAL LOW (ref 36.0–46.0)
Hemoglobin: 10.5 g/dL — ABNORMAL LOW (ref 12.0–15.0)
Immature Granulocytes: 1 %
Lymphocytes Relative: 24 %
Lymphs Abs: 2.4 10*3/uL (ref 0.7–4.0)
MCH: 28.8 pg (ref 26.0–34.0)
MCHC: 32.4 g/dL (ref 30.0–36.0)
MCV: 89 fL (ref 80.0–100.0)
Monocytes Absolute: 0.4 10*3/uL (ref 0.1–1.0)
Monocytes Relative: 4 %
Neutro Abs: 7.2 10*3/uL (ref 1.7–7.7)
Neutrophils Relative %: 70 %
Platelets: 206 10*3/uL (ref 150–400)
RBC: 3.64 MIL/uL — ABNORMAL LOW (ref 3.87–5.11)
RDW: 14 % (ref 11.5–15.5)
WBC: 10.1 10*3/uL (ref 4.0–10.5)
nRBC: 0 % (ref 0.0–0.2)

## 2018-10-04 LAB — AMYLASE: Amylase: 86 U/L (ref 28–100)

## 2018-10-04 LAB — LIPASE, BLOOD: Lipase: 23 U/L (ref 11–51)

## 2018-10-04 NOTE — MAU Provider Note (Signed)
History     CSN: [id number]  Arrival date and time: 10/04/18 1645   First Provider Initiated Contact with Patient 10/04/18 1947      Chief Complaint  Patient presents with  . Abdominal Pain  . Back Pain   HPI  Ms.  [name] [name] is a 33 y.o. year old G54P1011 female at [redacted]w[redacted]d weeks gestation who presents to MAU reporting sharp, upper abdominal pain and lower back pain last night. She took a warm bath, oxycodone and a OTC sleep medication. She slept last night, but woke up with continued abdominal pain and on-going lower back pain. She took another oxycodone today around 1530 with no relief. She reports the pain in her abdomen as "burning". She denies N/V/D, constipation, or urinary complaints. She has a very bad H/A. She denies VB or vaginal d/c. She receives [facility] with [facility]. She and her husband state that they "tried to call [facility] 7 times before coming to the hospital and no one answered the phone."   Past Medical History:  Diagnosis Date  . Anemia 2009  . Anxiety   . Back pain 2010  . Exposure to x-rays 2012   normal  . H. pylori infection 2019  . H/O candidiasis   . H/O varicella   . History of gastroesophageal reflux (GERD) 2010   taking with tums, helps   . Hypotension   . Migraines 2009  . Sleep apnea 2012    Past Surgical History:  Procedure Laterality Date  . COLPOSCOPY  04/02/2011  . NO PAST SURGERIES      Family History  Problem Relation Age of Onset  . Asthma Mother   . Cancer Mother 5       breast, cervix and bone and liver  . Breast cancer Mother 41  . Asthma Father   . Cancer Other   . Heart disease Other   . Stroke Other   . Thyroid disease Other   . Diabetes Other   . Hypertension Other   . Hyperlipidemia Other   . Hypertension Maternal Aunt   . Heart disease Maternal Grandmother   . Hypertension Maternal Grandmother   . Cancer Maternal Grandmother   . Breast cancer Maternal Grandmother   . Hypertension Maternal Grandfather   . Breast  cancer Maternal Aunt   . Anesthesia problems Neg Hx     Social History   Tobacco Use  . Smoking status: Never Smoker  . Smokeless tobacco: Never Used  Substance Use Topics  . Alcohol use: No  . Drug use: No    Allergies:  Allergies  Allergen Reactions  . Other Anxiety and Rash    Medication for sleep given while inpatient; doesn't remember name, no matching medications listed on chart history.  Possibly ambien, lunesta, benadryl, doxylamine.    Medications Prior to Admission  Medication Sig Dispense Refill Last Dose  . docusate sodium (COLACE) 100 MG capsule Take 1 capsule (100 mg total) by mouth 3 (three) times daily with meals. 60 capsule 0 Past Week at Unknown time  . omeprazole (PRILOSEC) 40 MG capsule Take 1 capsule (40 mg total) by mouth daily. 14 capsule 0 Past Week at Unknown time  . promethazine (PHENERGAN) 25 MG suppository Place 1 suppository (25 mg total) rectally every 6 (six) hours as needed for nausea or vomiting. 12 each 1 Past Week at Unknown time  . hydrocortisone cream 1 % Apply to affected area 2 times daily 14 g 0     Review of  Systems  Constitutional: Negative.   HENT: Negative.   Eyes: Negative.   Respiratory: Negative.   Cardiovascular: Negative.   Gastrointestinal: Positive for abdominal pain (upper, R>L). Negative for nausea and vomiting.  Endocrine: Negative.   Genitourinary: Negative.   Musculoskeletal: Positive for back pain (lower).  Skin: Negative.   Allergic/Immunologic: Negative.   Neurological: Negative.   Hematological: Negative.   Psychiatric/Behavioral: Negative.    Physical Exam   Blood pressure 113/71, pulse 85, temperature 98.7 F (37.1 C), temperature source Oral, resp. rate 18, weight 74.6 kg, SpO2 97 %.  Physical Exam  Nursing note and vitals reviewed. Constitutional: She is oriented to person, place, and time. She appears well-developed and well-nourished.  HENT:  Head: Normocephalic and atraumatic.  Eyes: Pupils are  equal, round, and reactive to light.  Neck: Normal range of motion.  Cardiovascular: Normal rate.  Respiratory: Effort normal.  GI: Soft. Normal appearance. There is abdominal tenderness in the right upper quadrant. There is guarding. There is no CVA tenderness.    Genitourinary:    Genitourinary Comments: Pelvic not indicated   Musculoskeletal: Normal range of motion.  Neurological: She is alert and oriented to person, place, and time. She has normal reflexes.  Skin: Skin is warm and dry.  Psychiatric: She has a normal mood and affect. Her behavior is normal. Judgment and thought content normal.    FHTs by doppler: 134 bpm   MAU Course  Procedures  MDM CCUA CBC w/Diff Amylase Lipase Abdominal U/S  *Consult with Dr. Elly Modena @ 2138 - notified of patient's complaints, assessments, lab & U/S results, tx plan transfer to [facility] for further evaluation - agrees with plan   *Consult with Dr. Vanita Panda @ 2148 - notified of patient's complaints, assessments, lab, U/S results -- plans to transfer to [facility] -- agrees with plan  Results for orders placed or performed during the hospital encounter of 10/04/18 (from the past 24 hour(s))  Urinalysis, Routine w reflex microscopic     Status: None   Collection Time: 10/04/18  6:22 PM  Result Value Ref Range   Color, Urine YELLOW YELLOW   APPearance CLEAR CLEAR   Specific Gravity, Urine 1.013 1.005 - 1.030   pH 7.0 5.0 - 8.0   Glucose, UA NEGATIVE NEGATIVE mg/dL   Hgb urine dipstick NEGATIVE NEGATIVE   Bilirubin Urine NEGATIVE NEGATIVE   Ketones, ur NEGATIVE NEGATIVE mg/dL   Protein, ur NEGATIVE NEGATIVE mg/dL   Nitrite NEGATIVE NEGATIVE   Leukocytes,Ua NEGATIVE NEGATIVE   US Abdomen Limited Ruq  Result Date: 10/04/2018 CLINICAL DATA:  Pain EXAM: ULTRASOUND ABDOMEN LIMITED RIGHT UPPER QUADRANT COMPARISON:  None. FINDINGS: Gallbladder: No gallstones or wall thickening visualized. No sonographic Murphy sign noted by sonographer. Common bile  duct: Diameter: 5.8 mm Liver: No focal lesion identified. Within normal limits in parenchymal echogenicity. Portal vein is patent on color Doppler imaging with normal direction of blood flow towards the liver. Other: None. IMPRESSION: Normal study. Electronically Signed   By: Dorise Bullion III M.D   On: 10/04/2018 20:53    Assessment and Plan  Right upper quadrant abdominal pain of unknown etiology - Dicussed with patient and spouse that u/s was negative  advised that the pain is not related to her pregnancy and we would like to transfer her to [facility] for further evaluation  Patient and spouse verbalized an understanding of the plan of care and agrees.  - Dr. Vanita Panda notified of patient transfer and labs pending -- accepts transfer to [facility]  Laury Deep, MSN, CNM 10/04/2018, 7:54 PM

## 2018-10-04 NOTE — MAU Note (Signed)
Pain started yesterday, took oxycodone and sleep medicine.  Was able to sleep but when got up the pain returned.  About 5 hrs ago, the pain became severe, lower back and lower abd.  Constant. Returned when [employer] down.  Took another oxycodone 2 hrs ago, no relief. Never had pain like this before. Denies n/v, diarrhea or constipation, no urinary symptoms. Has a very bad headache also.

## 2018-10-04 NOTE — MAU Note (Signed)
Pt to Transfer to [facility] for further eval of URQ abd pain. Dr. Cherene Altes accepting MD.Report called to [facility].

## 2018-10-04 NOTE — MAU Note (Signed)
Sharp abdominal and back pain began at 1am. Took oxycodone and went to sleep. Woke up today with continued sharp abdominal and back pain. Took tylenol in the am. Took another oxycodone a few hours ago.

## 2018-10-04 NOTE — MAU Note (Signed)
Pt transported via W/C  to [facility]

## 2018-10-04 NOTE — ED Triage Notes (Signed)
Pt arrived from MAU  For R lower abd pain for the past 24 hours. Took oxycodone without relief. Pt is [redacted] weeks pregnant, due 02/13/2019; cleared from obstetrics. Lab work completed at [employer] [facility] and had -[place]

## 2018-10-04 NOTE — MAU Note (Signed)
abd soft, but tender on palpation.

## 2018-10-05 ENCOUNTER — Inpatient Hospital Stay (HOSPITAL_COMMUNITY): Payer: Medicaid Other

## 2018-10-05 DIAGNOSIS — R1084 Generalized abdominal pain: Secondary | ICD-10-CM | POA: Diagnosis not present

## 2018-10-05 DIAGNOSIS — D252 Subserosal leiomyoma of uterus: Secondary | ICD-10-CM | POA: Diagnosis not present

## 2018-10-05 LAB — COMPREHENSIVE METABOLIC PANEL WITH GFR
ALT: 11 U/L (ref 0–44)
AST: 15 U/L (ref 15–41)
Albumin: 3.1 g/dL — ABNORMAL LOW (ref 3.5–5.0)
Alkaline Phosphatase: 72 U/L (ref 38–126)
Anion gap: 10 (ref 5–15)
BUN: <5 mg/dL — ABNORMAL LOW (ref 6–20)
CO2: 21 mmol/L — ABNORMAL LOW (ref 22–32)
Calcium: 8.6 mg/dL — ABNORMAL LOW (ref 8.9–10.3)
Chloride: 104 mmol/L (ref 98–111)
Creatinine, Ser: 0.37 mg/dL — ABNORMAL LOW (ref 0.44–1.00)
GFR calc Af Amer: >60 mL/min
GFR calc non Af Amer: >60 mL/min
Glucose, Bld: 79 mg/dL (ref 70–99)
Potassium: 3.4 mmol/L — ABNORMAL LOW (ref 3.5–5.1)
Sodium: 135 mmol/L (ref 135–145)
Total Bilirubin: 0.7 mg/dL (ref 0.3–1.2)
Total Protein: 6.9 g/dL (ref 6.5–8.1)

## 2018-10-05 MED ORDER — SODIUM CHLORIDE 0.9 % IV BOLUS
1000.0000 mL | Freq: Once | INTRAVENOUS | Status: AC
  Administered 2018-10-05: 01:00:00 1000 mL via INTRAVENOUS

## 2018-10-05 MED ORDER — ONDANSETRON HCL 4 MG/2ML IJ SOLN
4.0000 mg | Freq: Once | INTRAMUSCULAR | Status: AC
  Administered 2018-10-05: 4 mg via INTRAVENOUS
  Filled 2018-10-05: qty 2

## 2018-10-05 MED ORDER — LIDOCAINE VISCOUS HCL 2 % MT SOLN
15.0000 mL | Freq: Once | OROMUCOSAL | Status: AC
  Administered 2018-10-05: 15 mL via ORAL
  Filled 2018-10-05: qty 15

## 2018-10-05 MED ORDER — ALUM & MAG HYDROXIDE-SIMETH 200-200-20 MG/5ML PO SUSP
30.0000 mL | Freq: Once | ORAL | Status: AC
  Administered 2018-10-05: 30 mL via ORAL
  Filled 2018-10-05: qty 30

## 2018-10-05 MED ORDER — HYDROMORPHONE HCL 1 MG/ML IJ SOLN
1.0000 mg | Freq: Once | INTRAMUSCULAR | Status: AC
  Administered 2018-10-05: 1 mg via INTRAVENOUS
  Filled 2018-10-05: qty 1

## 2018-10-05 MED ORDER — SODIUM CHLORIDE 0.9 % IV BOLUS
1000.0000 mL | Freq: Once | INTRAVENOUS | Status: AC
  Administered 2018-10-05: 1000 mL via INTRAVENOUS

## 2018-10-05 NOTE — ED Notes (Signed)
Patient transported to MRI 

## 2018-10-05 NOTE — Discharge Instructions (Signed)
Your work-up in the emergency department today has been reassuring.  It is possible that your pain may be due to developing gastritis.  We recommend that you continue use of Tums or Maalox for management of pain.  You may also take your previously prescribed oxycodone.  Be cautious to not use this medication consistently for more than 3 to 5 days.  This medication, when taken for long periods, can also make you constipated.  You may also continue your home nausea medications.  Be sure to drink plenty of clear liquids to prevent dehydration.  Should you develop fever over 100.4 F, persistent vomiting with inability to tolerate fluids, vaginal bleeding or discharge, severe worsening of your abdominal pain, loss of consciousness, or other concerning symptoms, promptly return to the ED for evaluation.  Continue ongoing prenatal care with your OB/GYN.

## 2018-10-05 NOTE — ED Provider Notes (Signed)
[facility] EMERGENCY DEPARTMENT Provider Note   CSN: [id number] Arrival date & time: 10/04/18  [place]     History   Chief Complaint Chief Complaint  Patient presents with  . Abdominal Pain  . Back Pain    HPI [name] [name] is a 33 y.o. female.    [name] [name] is a 33 y.o. year old G25P1011 female at [redacted]w[redacted]d gestation presenting in transfer from MAU for evaluation of abdominal pain.  She states that pain was sudden onset and woke her from sleep at 1 AM.  She describes the pain as sharp.  It radiates from her epigastrium down towards her umbilicus and around towards her right flank.  States that pain has remained sharp, but is now also slightly burning in nature.  Initially took 1 oxycodone and Unisom at onset of her pain with some mild, temporary improvement.  Upon waking later in the morning, notes that her pain persisted.  She took an additional oxycodone in the afternoon without symptomatic relief.  Was prescribed oxycodone by her OB/GYN for ongoing back pain and right-sided sciatica.  States that her pain today feels different.  She has not had any fevers, increased belching or burping, nausea, vomiting, bowel changes, melena or hematochezia, dysuria, hematuria, vaginal bleeding or discharge.  She denies any history of prior abdominal surgeries.  Work-up at MAU was generally reassuring.  She had a right upper quadrant ultrasound performed which was negative for gallbladder etiology. Transferred for further work up.  The history is provided by the patient. No language interpreter was used.  Abdominal Pain Back Pain Associated symptoms: abdominal pain     Past Medical History:  Diagnosis Date  . Anemia 2009  . Anxiety   . Back pain 2010  . Exposure to x-rays 2012   normal  . H. pylori infection 2019  . H/O candidiasis   . H/O varicella   . History of gastroesophageal reflux (GERD) 2010   taking with tums, helps   . Hypotension   . Migraines 2009  .  Sleep apnea 2012    Patient Active Problem List   Diagnosis Date Noted  . Right upper quadrant abdominal pain of unknown etiology 10/04/2018  . Dizziness 11/09/2015  . Pelvic pain 11/09/2015  . Positive pregnancy test 11/09/2015  . Vitamin D deficiency 03/07/2014  . Tinea pedis of both feet 03/06/2014  . Chronic pain syndrome 03/06/2014  . Acne 06/06/2011  . Hypertriglyceridemia 05/07/2011  . Language Barrier (Arabic) 03/14/2011  . GERD (gastroesophageal reflux disease) 02/19/2011  . External hemorrhoid 02/19/2011    Past Surgical History:  Procedure Laterality Date  . COLPOSCOPY  04/02/2011  . NO PAST SURGERIES       OB History    Gravida  4   Para  1   Term  1   Preterm  0   AB  1   Living  1     SAB  0   TAB  1   Ectopic  0   Multiple  0   Live Births  1            Home Medications    Prior to Admission medications   Medication Sig Start Date End Date Taking? Authorizing Provider  docusate sodium (COLACE) 100 MG capsule Take 1 capsule (100 mg total) by mouth 3 (three) times daily with meals. 07/15/18  Yes Starr Lake, CNM  omeprazole (PRILOSEC) 40 MG capsule Take 1 capsule (40 mg total) by  mouth daily. 02/26/16  Yes Barnet Glasgow, NP  promethazine (PHENERGAN) 25 MG suppository Place 1 suppository (25 mg total) rectally [name] 6 (six) hours as needed for nausea or vomiting. 07/15/18  Yes Starr Lake, CNM  hydrocortisone cream 1 % Apply to affected area 2 times daily 08/08/18   Lajean Manes, CNM    Family History Family History  Problem Relation Age of Onset  . Asthma Mother   . Cancer Mother 46       breast, cervix and bone and liver  . Breast cancer Mother 50  . Asthma Father   . Cancer Other   . Heart disease Other   . Stroke Other   . Thyroid disease Other   . Diabetes Other   . Hypertension Other   . Hyperlipidemia Other   . Hypertension Maternal Aunt   . Heart disease Maternal Grandmother   .  Hypertension Maternal Grandmother   . Cancer Maternal Grandmother   . Breast cancer Maternal Grandmother   . Hypertension Maternal Grandfather   . Breast cancer Maternal Aunt   . Anesthesia problems Neg Hx     Social History Social History   Tobacco Use  . Smoking status: Never Smoker  . Smokeless tobacco: Never Used  Substance Use Topics  . Alcohol use: No  . Drug use: No     Allergies   Other   Review of Systems Review of Systems  Gastrointestinal: Positive for abdominal pain.  Musculoskeletal: Positive for back pain.  Ten systems reviewed and are negative for acute change, except as noted in the HPI.     Physical Exam Updated Vital Signs BP 132/80 (BP Location: Right Arm)   Pulse 81   Temp 98.2 F (36.8 C) (Oral)   Resp 20   Wt 74.6 kg   SpO2 100%   BMI 29.12 kg/m   Physical Exam Vitals signs and nursing note reviewed.  Constitutional:      General: She is not in acute distress.    Appearance: She is well-developed. She is not diaphoretic.     Comments: Appears uncomfortable, but nontoxic.  HENT:     Head: Normocephalic and atraumatic.  Eyes:     General: No scleral icterus.    Conjunctiva/sclera: Conjunctivae normal.  Neck:     Musculoskeletal: Normal range of motion.  Cardiovascular:     Rate and Rhythm: Normal rate and regular rhythm.     Pulses: Normal pulses.  Pulmonary:     Effort: Pulmonary effort is normal. No respiratory distress.     Breath sounds: No stridor. No wheezing.     Comments: Respirations even and unlabored Abdominal:     Tenderness: There is abdominal tenderness.     Comments: Gravid abdomen without peritoneal signs.  There is focal epigastric tenderness with referred pain to the epigastrium on palpation of the right upper quadrant.  Also some tenderness to the right flank on palpation.  Musculoskeletal: Normal range of motion.  Skin:    General: Skin is warm and dry.     Coloration: Skin is not pale.     Findings: No  erythema or rash.  Neurological:     General: No focal deficit present.     Mental Status: She is alert and oriented to person, place, and time.     Coordination: Coordination normal.     Comments: GCS 15. Patient moving all extremities spontaneously.  Psychiatric:        Mood and Affect: Affect is tearful.  Speech: Speech normal.        Behavior: Behavior normal.      ED Treatments / Results  Labs (all labs ordered are listed, but only abnormal results are displayed) Labs Reviewed  CBC WITH DIFFERENTIAL/PLATELET - Abnormal; Notable for the following components:      Result Value   RBC 3.64 (*)    Hemoglobin 10.5 (*)    HCT 32.4 (*)    All other components within normal limits  URINALYSIS, ROUTINE W REFLEX MICROSCOPIC  AMYLASE  LIPASE, BLOOD  COMPREHENSIVE METABOLIC PANEL    EKG EKG Interpretation  Date/Time:  Tuesday October 05 2018 00:07:31 EDT Ventricular Rate:  88 PR Interval:    QRS Duration: 95 QT Interval:  371 QTC Calculation: 449 R Axis:   42 Text Interpretation:  Sinus rhythm Borderline T abnormalities, anterior leads NO STEMI. No old tracing to compare Confirmed by Addison Lank [phone]) on 10/05/2018 12:19:45 AM   Radiology US Abdomen Limited Ruq  Result Date: 10/04/2018 CLINICAL DATA:  Pain EXAM: ULTRASOUND ABDOMEN LIMITED RIGHT UPPER QUADRANT COMPARISON:  None. FINDINGS: Gallbladder: No gallstones or wall thickening visualized. No sonographic Murphy sign noted by sonographer. Common bile duct: Diameter: 5.8 mm Liver: No focal lesion identified. Within normal limits in parenchymal echogenicity. Portal vein is patent on color Doppler imaging with normal direction of blood flow towards the liver. Other: None. IMPRESSION: Normal study. Electronically Signed   By: Dorise Bullion III M.D   On: 10/04/2018 20:53    Procedures Procedures (including critical care time)  Medications Ordered in ED Medications  HYDROmorphone (DILAUDID) injection 1 mg  (has no administration in time range)  sodium chloride 0.9 % bolus 1,000 mL (has no administration in time range)       Initial Impression / Assessment and Plan / ED Course  I have reviewed the triage vital signs and the nursing notes.  Pertinent labs & imaging results that were available during my care of the patient were reviewed by me and considered in my medical decision making (see chart for details).        63:25 AM 33 year old G67P1011 female at [redacted]w[redacted]d gestation presenting in transfer from MAU for evaluation of abdominal pain.  Remarkably tender on palpation in the epigastrium without overt peritoneal signs.  Initial labs obtained when at [facility].  These have been reassuring.  Patient also underwent right upper quadrant ultrasound which is negative for gallbladder etiology.  Kidney stone felt less likely as patient has no hematuria.  She has no evidence of urinary tract infection today.  Metabolic panel was added to assess electrolytes as well as kidney and liver function.  While her white blood cell count is elevated compared to prior assessment, she has no leukocytosis or left shift.  Given degree of discomfort, decision was made to proceed with MRI of the abdomen and pelvis to assess for any additional acute pathology.  Will hydrate and give IV pain medication for symptom management.  2:20 AM Patient presently resting comfortable w/o c/o pain. Transported to MRI. CMP reassuring.  3:39 AM Called to patient's bedside by husband as patient was complaining of difficulty breathing.  Patient sitting up in bed complaining of nausea as well as recurrent pain.  She states that she feels that it is difficult to take a deep breath because of her pain.  Her heart rate is in the 70s with oxygen saturations of 99 to 100%.  Blood pressure is 123XX123 systolic.  Lungs CTAB.  She appears  uncomfortable.  Pending MRI results.  Will give Zofran and continue to monitor.  3:56 AM Preliminary MRI  read as negative, per radiology.  4:54 AM Patient had one episode of emesis following GI cocktail.  She states that she vomited because she did not like the taste of it.  She is presently not complaining of any nausea.  She was given some Zofran.  Discussed MRI findings with the patient and her spouse at bedside who verbalized understanding.  I have offered admission given ongoing discomfort, though her imaging and labs have been reassuring and I have a low suspicion that further work-up would be necessary if patient decided to remain for observation.  At this point, patient states that she is comfortable returning home and managing her symptoms on an outpatient basis.  She has additional oxycodone at home to use for pain as needed.  Have encouraged her to add use of Tums or Maalox as her pain may be secondary to gastritis.  Also discussed that use of narcotic pain medication may cause constipation; however, her MRI today did not suggest significant stool burden.  Patient to continue follow-up with her OB/GYN for prenatal care as well as her primary care doctor.  Return precautions discussed and provided. Patient discharged in stable condition with no unaddressed concerns.   Final Clinical Impressions(s) / ED Diagnoses   Final diagnoses:  Right upper quadrant abdominal pain of unknown etiology    ED Discharge Orders    None       Antonietta Breach, PA-C 10/05/18 0500    Fatima Blank, MD 10/05/18 [phone]

## 2018-10-07 DIAGNOSIS — M791 Myalgia, unspecified site: Secondary | ICD-10-CM | POA: Diagnosis not present

## 2018-10-07 DIAGNOSIS — Z3A21 21 weeks gestation of pregnancy: Secondary | ICD-10-CM | POA: Diagnosis not present

## 2018-10-07 DIAGNOSIS — O210 Mild hyperemesis gravidarum: Secondary | ICD-10-CM | POA: Diagnosis not present

## 2018-10-07 DIAGNOSIS — Z0183 Encounter for blood typing: Secondary | ICD-10-CM | POA: Diagnosis not present

## 2018-10-07 DIAGNOSIS — Z3402 Encounter for supervision of normal first pregnancy, second trimester: Secondary | ICD-10-CM | POA: Diagnosis not present

## 2018-10-26 DIAGNOSIS — Z3A24 24 weeks gestation of pregnancy: Secondary | ICD-10-CM | POA: Diagnosis not present

## 2018-10-26 DIAGNOSIS — O4402 Placenta previa specified as without hemorrhage, second trimester: Secondary | ICD-10-CM | POA: Diagnosis not present

## 2018-11-16 DIAGNOSIS — O26859 Spotting complicating pregnancy, unspecified trimester: Secondary | ICD-10-CM | POA: Diagnosis not present

## 2018-11-16 DIAGNOSIS — Z3A27 27 weeks gestation of pregnancy: Secondary | ICD-10-CM | POA: Diagnosis not present

## 2018-11-16 DIAGNOSIS — R42 Dizziness and giddiness: Secondary | ICD-10-CM | POA: Diagnosis not present

## 2018-11-16 LAB — OB RESULTS CONSOLE HIV ANTIBODY (ROUTINE TESTING): HIV: NONREACTIVE

## 2018-11-16 LAB — OB RESULTS CONSOLE RPR: RPR: NONREACTIVE

## 2018-12-14 DIAGNOSIS — Z3A31 31 weeks gestation of pregnancy: Secondary | ICD-10-CM | POA: Diagnosis not present

## 2018-12-14 DIAGNOSIS — Z0183 Encounter for blood typing: Secondary | ICD-10-CM | POA: Diagnosis not present

## 2018-12-14 DIAGNOSIS — Z3403 Encounter for supervision of normal first pregnancy, third trimester: Secondary | ICD-10-CM | POA: Diagnosis not present

## 2018-12-27 DIAGNOSIS — R112 Nausea with vomiting, unspecified: Secondary | ICD-10-CM | POA: Diagnosis not present

## 2018-12-27 DIAGNOSIS — O99019 Anemia complicating pregnancy, unspecified trimester: Secondary | ICD-10-CM | POA: Diagnosis not present

## 2018-12-27 DIAGNOSIS — Z3A33 33 weeks gestation of pregnancy: Secondary | ICD-10-CM | POA: Diagnosis not present

## 2018-12-27 DIAGNOSIS — Z0183 Encounter for blood typing: Secondary | ICD-10-CM | POA: Diagnosis not present

## 2018-12-27 DIAGNOSIS — K5900 Constipation, unspecified: Secondary | ICD-10-CM | POA: Diagnosis not present

## 2018-12-27 DIAGNOSIS — Z8619 Personal history of other infectious and parasitic diseases: Secondary | ICD-10-CM | POA: Diagnosis not present

## 2018-12-27 DIAGNOSIS — Z6711 Type A blood, Rh negative: Secondary | ICD-10-CM | POA: Diagnosis not present

## 2018-12-27 DIAGNOSIS — Z3403 Encounter for supervision of normal first pregnancy, third trimester: Secondary | ICD-10-CM | POA: Diagnosis not present

## 2018-12-27 DIAGNOSIS — Z3402 Encounter for supervision of normal first pregnancy, second trimester: Secondary | ICD-10-CM | POA: Diagnosis not present

## 2018-12-27 DIAGNOSIS — Z8279 Family history of other congenital malformations, deformations and chromosomal abnormalities: Secondary | ICD-10-CM | POA: Diagnosis not present

## 2018-12-27 DIAGNOSIS — E559 Vitamin D deficiency, unspecified: Secondary | ICD-10-CM | POA: Diagnosis not present

## 2018-12-27 DIAGNOSIS — O210 Mild hyperemesis gravidarum: Secondary | ICD-10-CM | POA: Diagnosis not present

## 2019-01-07 NOTE — L&D Delivery Note (Signed)
Delivery Note   Patient Name: [name] [name] DOB: 13-Apr-1985 MRN: [id number]  Date of admission: 02/13/2019 Delivering MD: Noralyn Pick  Date of delivery: 02/13/19 Type of delivery: SVD  Newborn Data: Live born female  Birth Weight:   APGAR: 91, 79  Newborn Delivery   Birth date/time: 02/13/2019 11:41:00 Delivery type: Vaginal, Spontaneous     [facility], 34 y.o., @ [redacted]w[redacted]d,  G3P1011, who was admitted for SROM with early labor. I was called to the room when she progressed +3 station in the second stage of labor.  She pushed for 2/min.  She delivered a viable infant, cephalic and restituted to the OA position over an intact perineum.  A nuchal cord   was not identified. The baby was placed on maternal abdomen while initial step of NRP were perfmored (Dry, Stimulated, and warmed). Hat placed on baby for thermoregulation. Delayed cord clamping was performed for 2 minutes.  Cord double clamped and cut.  Cord cut by father. Apgar scores were 9 and 9. Prophylactic Pitocin was started in the third stage of labor for active management. The placenta delivered spontaneously, shultz, with a 3 vessel cord and was sent to LD.  Inspection revealed none. An examination of the vaginal vault and cervix was free from lacerations. The uterus was firm, bleeding stable.  Placenta and umbilical artery blood gas were not sent.  There were no complications during the procedure.  Mom and baby skin to skin following delivery. Left in stable condition.  Maternal Info: Anesthesia: Epidural Episiotomy: no Lacerations:  no Suture Repair: no Est. Blood Loss (mL):  381mls  Newborn Info:  Baby Sex: female  APGAR (1 MIN): 9   APGAR (5 MINS): 9   APGAR (10 MINS):     Mom to postpartum.  Baby to Couplet care / Skin to Skin.  Dr Alesia Richards aware of birth   [place], [place], NP-C 02/13/19 12:04 PM

## 2019-01-18 ENCOUNTER — Encounter (HOSPITAL_COMMUNITY): Payer: Self-pay | Admitting: Obstetrics and Gynecology

## 2019-01-18 ENCOUNTER — Other Ambulatory Visit: Payer: Self-pay

## 2019-01-18 ENCOUNTER — Inpatient Hospital Stay (HOSPITAL_COMMUNITY)
Admission: AD | Admit: 2019-01-18 | Discharge: 2019-01-18 | Disposition: A | Discharge status: Home / Self Care | Payer: Medicaid Other | Attending: Obstetrics and Gynecology | Admitting: Obstetrics and Gynecology

## 2019-01-18 DIAGNOSIS — R1011 Right upper quadrant pain: Secondary | ICD-10-CM

## 2019-01-18 DIAGNOSIS — O479 False labor, unspecified: Secondary | ICD-10-CM

## 2019-01-18 DIAGNOSIS — O26893 Other specified pregnancy related conditions, third trimester: Secondary | ICD-10-CM | POA: Insufficient documentation

## 2019-01-18 DIAGNOSIS — Z3403 Encounter for supervision of normal first pregnancy, third trimester: Secondary | ICD-10-CM | POA: Diagnosis not present

## 2019-01-18 DIAGNOSIS — O4703 False labor before 37 completed weeks of gestation, third trimester: Secondary | ICD-10-CM | POA: Diagnosis not present

## 2019-01-18 DIAGNOSIS — Z3A36 36 weeks gestation of pregnancy: Secondary | ICD-10-CM | POA: Diagnosis not present

## 2019-01-18 LAB — OB RESULTS CONSOLE GBS: GBS: NEGATIVE

## 2019-01-18 MED ORDER — LACTATED RINGERS IV BOLUS
500.0000 mL | Freq: Once | INTRAVENOUS | Status: AC
  Administered 2019-01-18: 17:00:00 500 mL via INTRAVENOUS

## 2019-01-18 MED ORDER — MORPHINE SULFATE (PF) 4 MG/ML IV SOLN
4.0000 mg | Freq: Once | INTRAVENOUS | Status: AC
  Administered 2019-01-18: 4 mg via INTRAVENOUS
  Filled 2019-01-18: qty 1

## 2019-01-18 MED ORDER — PROMETHAZINE HCL 25 MG/ML IJ SOLN
12.5000 mg | Freq: Once | INTRAMUSCULAR | Status: AC
  Administered 2019-01-18: 17:00:00 12.5 mg via INTRAVENOUS
  Filled 2019-01-18: qty 1

## 2019-01-18 NOTE — MAU Provider Note (Addendum)
Chief Complaint:  Contractions   None    HPI: [name] [name] is a 34 y.o. G4P1011 at [redacted]w[redacted]d who presents to maternity admissions reporting she was sent by Dr Alesia Richards from [facility] for labor check and evaluation and pain control managent. Pt stable , cxt pain was gone by the time she got to the MAU, pt stable now, pt reported cxt were every 15 mins apart and stated this morning. Denies  leakage of fluid or vaginal bleeding. Good fetal movement. Pt denies sob, cp, dysuria, vaginal discharge.   Pregnancy Course:   Past Medical History:  Diagnosis Date  . Anemia 2009  . Anxiety   . Back pain 2010  . Exposure to x-rays 2012   normal  . H. pylori infection 2019  . H/O candidiasis   . H/O varicella   . History of gastroesophageal reflux (GERD) 2010   taking with tums, helps   . Hypotension   . Migraines 2009  . Sleep apnea 2012   OB History  Gravida Para Term Preterm AB Living  3 1 1  0 1 1  SAB TAB Ectopic Multiple Live Births  0 1 0 0 1    # Outcome Date GA Lbr Len/2nd Weight Sex Delivery Anes PTL Lv  3 Current           2 Term 10/14/11 [redacted]w[redacted]d  3274 g F Vag-Spont EPI  LIV  1 TAB            Past Surgical History:  Procedure Laterality Date  . COLPOSCOPY  04/02/2011  . NO PAST SURGERIES     Family History  Problem Relation Age of Onset  . Asthma Mother   . Cancer Mother 49       breast, cervix and bone and liver  . Breast cancer Mother 44  . Asthma Father   . Cancer Other   . Heart disease Other   . Stroke Other   . Thyroid disease Other   . Diabetes Other   . Hypertension Other   . Hyperlipidemia Other   . Hypertension Maternal Aunt   . Heart disease Maternal Grandmother   . Hypertension Maternal Grandmother   . Cancer Maternal Grandmother   . Breast cancer Maternal Grandmother   . Hypertension Maternal Grandfather   . Breast cancer Maternal Aunt   . Anesthesia problems Neg Hx    Social History   Tobacco Use  . Smoking status: Never Smoker  . Smokeless tobacco:  Never Used  Substance Use Topics  . Alcohol use: No  . Drug use: No   Allergies  Allergen Reactions  . Other Anxiety and Rash    Medication for sleep given while inpatient; doesn't remember name, no matching medications listed on chart history.  Possibly ambien, lunesta, benadryl, doxylamine.   Medications Prior to Admission  Medication Sig Dispense Refill Last Dose  . promethazine (PHENERGAN) 25 MG suppository Place 1 suppository (25 mg total) rectally every 6 (six) hours as needed for nausea or vomiting. 12 each 1 01/17/2019 at Unknown time  . docusate sodium (COLACE) 100 MG capsule Take 1 capsule (100 mg total) by mouth 3 (three) times daily with meals. 60 capsule 0   . hydrocortisone cream 1 % Apply to affected area 2 times daily 14 g 0   . omeprazole (PRILOSEC) 40 MG capsule Take 1 capsule (40 mg total) by mouth daily. 14 capsule 0     I have reviewed patient's Past Medical Hx, Surgical Hx, Family Hx, Social  Hx, medications and allergies.   ROS:  Review of Systems  Constitutional: Negative.   HENT: Negative.   Respiratory: Negative.   Cardiovascular: Negative.   Gastrointestinal: Positive for abdominal pain.  Endocrine: Negative.   Genitourinary: Negative.   Musculoskeletal: Negative.   Neurological: Negative.   Psychiatric/Behavioral: Negative.     Physical Exam   Patient Vitals for the past 24 hrs:  BP Temp Temp src Pulse Resp SpO2 Weight  01/18/19 1746 107/70 -- -- 81 -- -- --  01/18/19 1603 111/69 98.3 F (36.8 C) Oral (!) 101 18 99 % 79.1 kg   Constitutional: Well-developed, well-nourished female in no acute distress.  Cardiovascular: normal rate Respiratory: normal effort GI: Abd soft, non-tender, gravid appropriate for gestational age. Pos BS x 4 MS: Extremities nontender, no edema, normal ROM Neurologic: Alert and oriented x 4.  GU: Neg CVAT.  Deferred vaginal exam   Dilation: 1 Effacement (%): Thick Station: -3 Exam by:: F. Morris, RNC Pelvic: Proven  to 3274gm  NST: FHR baseline 125 bpm, Variability: moderate, Accelerations:present, Decelerations:  Absent= Cat 1/Reactive UC:   irregular, occ SVE:   Dilation: 1 Effacement (%): Thick Station: -3 Exam by:: F. Morris, RNC, vertex verified by fetal sutures.  Leopold's: Position vertex, EFW 7lbs via leopold's.   No cervical change over 1 hour.   Labs: No results found for this or any previous visit (from the past 24 hour(s)).  Imaging:  No results found.  MAU Course: Orders Placed This Encounter  Procedures  . Diet - low sodium heart healthy  . Increase activity slowly  . Call MD for:  . Call MD for:  temperature >100.4  . Call MD for:  persistant nausea and vomiting  . Call MD for:  severe uncontrolled pain  . Call MD for:  redness, tenderness, or signs of infection (pain, swelling, redness, odor or green/yellow discharge around incision site)  . Call MD for:  difficulty breathing, headache or visual disturbances  . Call MD for:  hives  . Call MD for:  persistant dizziness or light-headedness  . Call MD for:  extreme fatigue  . Discharge diagnosis  . Insert peripheral IV  . Discharge patient Discharge disposition: 01-Home or Self Care; Discharge patient date: 01/18/2019   Meds ordered this encounter  Medications  . lactated ringers bolus 500 mL  . morphine 4 MG/ML injection 4 mg  . promethazine (PHENERGAN) injection 12.5 mg    MDM: PE, no cervical change over 1.5 hours, pain resolved. Discharged to home in stable condition, morphine and phenergan given with 561mls bolus. Consulted with Dr Mancel Bale and Dr Alesia Richards   Assessment: 1. False labor   2. Right upper quadrant abdominal pain of unknown etiology   GBS culture sent from office today, pain resolved with morphine, phenergan and 58ml bolus. Discharged to home with husband driving in stable condition. Pt requesting to go home.   Plan: Discharge home in stable condition.  Labor precautions and fetal kick  counts  PRETERM LABOR: Includes any of the following symptoms that occur between 20-[redacted] weeks gestation. If these symptoms are not stopped, preterm labor can result in preterm delivery, placing your baby at risk.  Notify your doctor if any of the following occur: 1. Menstrual-like cramps   5. Pelvic pressure  2. Uterine contractions. These may be painless and feel like the uterus is tightening or the baby is "balling up" 6. Increase or change in vaginal discharge  3. Low, dull backache, unrelieved by heat or  Tylenol  7. Vaginal bleeding  4. Intestinal cramps, with our without diarrhea, sometimes 8. A general feeling that "something is not right"   9. Leaking of fluid described as "gas pain"    LABOR: When contractions begin, you should start to time them from the beginning of one contraction to the beginning of the next.  When contractions are 5-10 minutes apart or less and have been regular for at least an hour, you should call your health care provider.  Notify your doctor if any of the following occur: 1. Bleeding from the vagina 7. Sudden, constant, or occasional abdominal pain  2. Pain or burning when urinating 8. Sudden gushing of fluid from the vagina (with or without continued leaking)  3. Chills or fever 9. Fainting spells, "black outs" or loss of consciousness  4. Increase in vaginal discharge 10. Severe or continued nausea or vomiting  5. Pelvic pressure (sudden increase) 11. Blurring of vision or spots before the eyes  6. Baby moving less than usual 12. Leaking of fluid    FETAL KICK COUNT: Lie on your left side for one hour after a meal, and count the number of times your baby kicks. If it is less than 5 times, get up, move around and drink some juice. Repeat the test 30 minutes later. If it is still less than 5 kicks in an hour, notify your doctor. Follow-up [facility] Follow up.   Specialty: Obstetrics and Gynecology Why: ROB  within 1 week Contact information: [address] [phone]          Allergies as of 01/18/2019      Reactions   Other Anxiety, Rash   Medication for sleep given while inpatient; doesn't remember name, no matching medications listed on chart history.  Possibly ambien, lunesta, benadryl, doxylamine.      Medication List    TAKE these medications   docusate sodium 100 MG capsule Commonly known as: COLACE Take 1 capsule (100 mg total) by mouth 3 (three) times daily with meals.   hydrocortisone cream 1 % Apply to affected area 2 times daily   omeprazole 40 MG capsule Commonly known as: PriLOSEC Take 1 capsule (40 mg total) by mouth daily.   promethazine 25 MG suppository Commonly known as: Phenergan Place 1 suppository (25 mg total) rectally every 6 (six) hours as needed for nausea or vomiting.       [facility] NP-C, [place], [place], [place] 01/18/2019 5:55 PM

## 2019-01-18 NOTE — MAU Note (Signed)
Went to OB/GYN, sent in for labor check. No bleeding or leaking.  Ctx's coming every 15 min. Started this morning.

## 2019-01-18 NOTE — Discharge Instructions (Signed)
Abdominal Pain, Adult Many things can cause belly (abdominal) pain. Most times, belly pain is not dangerous. Many cases of belly pain can be watched and treated at home. Sometimes, though, belly pain is serious. Your doctor will try to find the cause of your belly pain. Follow these instructions at home:  Medicines  Take over-the-counter and prescription medicines only as told by your doctor.  Do not take medicines that help you poop (laxatives) unless told by your doctor. General instructions  Watch your belly pain for any changes.  Drink enough fluid to keep your pee (urine) pale yellow.  Keep all follow-up visits as told by your doctor. This is important. Contact a doctor if:  Your belly pain changes or gets worse.  You are not hungry, or you lose weight without trying.  You are having trouble pooping (constipated) or have watery poop (diarrhea) for more than 2-3 days.  You have pain when you pee or poop.  Your belly pain wakes you up at night.  Your pain gets worse with meals, after eating, or with certain foods.  You are vomiting and cannot keep anything down.  You have a fever.  You have blood in your pee. Get help right away if:  Your pain does not go away as soon as your doctor says it should.  You cannot stop vomiting.  Your pain is only in areas of your belly, such as the right side or the left lower part of the belly.  You have bloody or black poop, or poop that looks like tar.  You have very bad pain, cramping, or bloating in your belly.  You have signs of not having enough fluid or water in your body (dehydration), such as: ? Dark pee, very little pee, or no pee. ? Cracked lips. ? Dry mouth. ? Sunken eyes. ? Sleepiness. ? Weakness.  You have trouble breathing or chest pain. Summary  Many cases of belly pain can be watched and treated at home.  Watch your belly pain for any changes.  Take over-the-counter and prescription medicines only as  told by your doctor.  Contact a doctor if your belly pain changes or gets worse.  Get help right away if you have very bad pain, cramping, or bloating in your belly. This information is not intended to replace advice given to you by your health care provider. Make sure you discuss any questions you have with your health care provider. Document Revised: 05/03/2018 Document Reviewed: 05/03/2018 Elsevier Patient Education  2020 Elsevier Inc.   [employer] of the uterus can occur throughout pregnancy, but they are not always a sign that you are in labor. You may have practice contractions called Braxton Hicks contractions. These false labor contractions are sometimes confused with true labor. What are [place] contractions? Braxton Hicks contractions are tightening movements that occur in the muscles of the uterus before labor. Unlike true labor contractions, these contractions do not result in opening (dilation) and thinning of the cervix. Toward the end of pregnancy (32-34 weeks), Braxton Hicks contractions can happen more often and may become stronger. These contractions are sometimes difficult to tell apart from true labor because they can be very uncomfortable. You should not feel embarrassed if you go to the hospital with false labor. Sometimes, the only way to tell if you are in true labor is for your health care provider to look for changes in the cervix. The health care provider will do a physical exam and may  monitor your contractions. If you are not in true labor, the exam should show that your cervix is not dilating and your water has not broken. If there are no other health problems associated with your pregnancy, it is completely safe for you to be sent home with false labor. You may continue to have Braxton Hicks contractions until you go into true labor. How to tell the difference between true labor and false labor True labor  Contractions last 30-70  seconds.  Contractions become very regular.  Discomfort is usually felt in the top of the uterus, and it spreads to the lower abdomen and low back.  Contractions do not go away with walking.  Contractions usually become more intense and increase in frequency.  The cervix dilates and gets thinner. False labor  Contractions are usually shorter and not as strong as true labor contractions.  Contractions are usually irregular.  Contractions are often felt in the front of the lower abdomen and in the groin.  Contractions may go away when you walk around or change positions while lying down.  Contractions get weaker and are shorter-lasting as time goes on.  The cervix usually does not dilate or become thin. Follow these instructions at home:   Take over-the-counter and prescription medicines only as told by your health care provider.  Keep up with your usual exercises and follow other instructions from your health care provider.  Eat and drink lightly if you think you are going into labor.  If Braxton Hicks contractions are making you uncomfortable: ? Change your position from lying down or resting to walking, or change from walking to resting. ? Sit and rest in a tub of warm water. ? Drink enough fluid to keep your urine pale yellow. Dehydration may cause these contractions. ? Do slow and deep breathing several times an hour.  Keep all follow-up prenatal visits as told by your health care provider. This is important. Contact a health care provider if:  You have a fever.  You have continuous pain in your abdomen. Get help right away if:  Your contractions become stronger, more regular, and closer together.  You have fluid leaking or gushing from your vagina.  You pass blood-tinged mucus (bloody show).  You have bleeding from your vagina.  You have low back pain that you never had before.  You feel your baby's head pushing down and causing pelvic pressure.  Your  baby is not moving inside you as much as it used to. Summary  Contractions that occur before labor are called Braxton Hicks contractions, false labor, or practice contractions.  Braxton Hicks contractions are usually shorter, weaker, farther apart, and less regular than true labor contractions. True labor contractions usually become progressively stronger and regular, and they become more frequent.  Manage discomfort from [facility] contractions by changing position, resting in a warm bath, drinking plenty of water, or practicing deep breathing. This information is not intended to replace advice given to you by your health care provider. Make sure you discuss any questions you have with your health care provider. Document Revised: 12/05/2016 Document Reviewed: 05/08/2016 Elsevier Patient Education  [place].

## 2019-02-13 ENCOUNTER — Inpatient Hospital Stay (HOSPITAL_COMMUNITY)
Admission: AD | Admit: 2019-02-13 | Discharge: 2019-02-15 | DRG: 807 | Disposition: A | Discharge status: Home / Self Care | Payer: Medicaid Other | Attending: Obstetrics & Gynecology | Admitting: Obstetrics & Gynecology

## 2019-02-13 ENCOUNTER — Encounter (HOSPITAL_COMMUNITY): Payer: Self-pay | Admitting: Obstetrics and Gynecology

## 2019-02-13 ENCOUNTER — Inpatient Hospital Stay (HOSPITAL_COMMUNITY): Payer: Medicaid Other | Admitting: Anesthesiology

## 2019-02-13 ENCOUNTER — Other Ambulatory Visit: Payer: Self-pay

## 2019-02-13 DIAGNOSIS — K219 Gastro-esophageal reflux disease without esophagitis: Secondary | ICD-10-CM | POA: Diagnosis present

## 2019-02-13 DIAGNOSIS — Z3A40 40 weeks gestation of pregnancy: Secondary | ICD-10-CM

## 2019-02-13 DIAGNOSIS — O9962 Diseases of the digestive system complicating childbirth: Secondary | ICD-10-CM | POA: Diagnosis present

## 2019-02-13 DIAGNOSIS — O9902 Anemia complicating childbirth: Principal | ICD-10-CM | POA: Diagnosis present

## 2019-02-13 DIAGNOSIS — Z20822 Contact with and (suspected) exposure to covid-19: Secondary | ICD-10-CM | POA: Diagnosis present

## 2019-02-13 DIAGNOSIS — D649 Anemia, unspecified: Secondary | ICD-10-CM | POA: Diagnosis not present

## 2019-02-13 DIAGNOSIS — O26893 Other specified pregnancy related conditions, third trimester: Secondary | ICD-10-CM | POA: Diagnosis present

## 2019-02-13 DIAGNOSIS — R1011 Right upper quadrant pain: Secondary | ICD-10-CM

## 2019-02-13 LAB — POCT FERN TEST: POCT Fern Test: POSITIVE

## 2019-02-13 LAB — CBC
HCT: 30.7 % — ABNORMAL LOW (ref 36.0–46.0)
Hemoglobin: 9.4 g/dL — ABNORMAL LOW (ref 12.0–15.0)
MCH: 25.1 pg — ABNORMAL LOW (ref 26.0–34.0)
MCHC: 30.6 g/dL (ref 30.0–36.0)
MCV: 81.9 fL (ref 80.0–100.0)
Platelets: 255 10*3/uL (ref 150–400)
RBC: 3.75 MIL/uL — ABNORMAL LOW (ref 3.87–5.11)
RDW: 14.3 % (ref 11.5–15.5)
WBC: 10.1 10*3/uL (ref 4.0–10.5)
nRBC: 0 % (ref 0.0–0.2)

## 2019-02-13 LAB — RPR: RPR Ser Ql: NONREACTIVE

## 2019-02-13 LAB — SARS CORONAVIRUS 2 (TAT 6-24 HRS): SARS Coronavirus 2: NEGATIVE

## 2019-02-13 MED ORDER — OXYTOCIN BOLUS FROM INFUSION
500.0000 mL | Freq: Once | INTRAVENOUS | Status: AC
  Administered 2019-02-13: 500 mL via INTRAVENOUS

## 2019-02-13 MED ORDER — LACTATED RINGERS IV SOLN
500.0000 mL | INTRAVENOUS | Status: DC | PRN
  Administered 2019-02-13: 500 mL via INTRAVENOUS

## 2019-02-13 MED ORDER — SODIUM CHLORIDE (PF) 0.9 % IJ SOLN
INTRAMUSCULAR | Status: DC | PRN
  Administered 2019-02-13: 12 mL/h via EPIDURAL

## 2019-02-13 MED ORDER — OXYCODONE-ACETAMINOPHEN 5-325 MG PO TABS: 1.0000 | ORAL_TABLET | ORAL | Status: DC | PRN

## 2019-02-13 MED ORDER — COCONUT OIL OIL: 1.0000 "application " | TOPICAL_OIL | Status: DC | PRN

## 2019-02-13 MED ORDER — EPHEDRINE 5 MG/ML INJ: 10.0000 mg | INTRAVENOUS | Status: DC | PRN

## 2019-02-13 MED ORDER — ACETAMINOPHEN 325 MG PO TABS
650.0000 mg | ORAL_TABLET | ORAL | Status: DC | PRN
  Administered 2019-02-13 – 2019-02-15 (×5): 650 mg via ORAL
  Filled 2019-02-13 (×5): qty 2

## 2019-02-13 MED ORDER — RHO D IMMUNE GLOBULIN 1500 UNIT/2ML IJ SOSY
300.0000 ug | PREFILLED_SYRINGE | Freq: Once | INTRAMUSCULAR | Status: AC
  Administered 2019-02-14: 300 ug via INTRAVENOUS
  Filled 2019-02-13: qty 2

## 2019-02-13 MED ORDER — IBUPROFEN 600 MG PO TABS
600.0000 mg | ORAL_TABLET | Freq: Four times a day (QID) | ORAL | Status: DC
  Administered 2019-02-13 – 2019-02-15 (×8): 600 mg via ORAL
  Filled 2019-02-13 (×8): qty 1

## 2019-02-13 MED ORDER — FENTANYL-BUPIVACAINE-NACL 0.5-0.125-0.9 MG/250ML-% EP SOLN: 12.0000 mL/h | EPIDURAL | Status: DC | PRN

## 2019-02-13 MED ORDER — ZOLPIDEM TARTRATE 5 MG PO TABS: 5.0000 mg | ORAL_TABLET | Freq: Every evening | ORAL | Status: DC | PRN

## 2019-02-13 MED ORDER — DIPHENHYDRAMINE HCL 25 MG PO CAPS: 25.0000 mg | ORAL_CAPSULE | Freq: Four times a day (QID) | ORAL | Status: DC | PRN

## 2019-02-13 MED ORDER — SIMETHICONE 80 MG PO CHEW
80.0000 mg | CHEWABLE_TABLET | ORAL | Status: DC | PRN
  Administered 2019-02-13 – 2019-02-15 (×2): 80 mg via ORAL
  Filled 2019-02-13 (×2): qty 1

## 2019-02-13 MED ORDER — MISOPROSTOL 50MCG HALF TABLET
50.0000 ug | ORAL_TABLET | Freq: Once | ORAL | Status: AC
  Administered 2019-02-13: 50 ug via BUCCAL
  Filled 2019-02-13: qty 1

## 2019-02-13 MED ORDER — SOD CITRATE-CITRIC ACID 500-334 MG/5ML PO SOLN: 30.0000 mL | ORAL | Status: DC | PRN

## 2019-02-13 MED ORDER — OXYTOCIN 40 UNITS IN NORMAL SALINE INFUSION - SIMPLE MED
2.5000 [IU]/h | INTRAVENOUS | Status: DC
  Filled 2019-02-13: qty 1000

## 2019-02-13 MED ORDER — LACTATED RINGERS IV SOLN: INTRAVENOUS | Status: DC

## 2019-02-13 MED ORDER — ONDANSETRON HCL 4 MG/2ML IJ SOLN: 4.0000 mg | Freq: Four times a day (QID) | INTRAMUSCULAR | Status: DC | PRN

## 2019-02-13 MED ORDER — ACETAMINOPHEN 325 MG PO TABS: 650.0000 mg | ORAL_TABLET | ORAL | Status: DC | PRN

## 2019-02-13 MED ORDER — LIDOCAINE HCL (PF) 1 % IJ SOLN
INTRAMUSCULAR | Status: DC | PRN
  Administered 2019-02-13: 6 mL via EPIDURAL

## 2019-02-13 MED ORDER — PHENYLEPHRINE 40 MCG/ML (10ML) SYRINGE FOR IV PUSH (FOR BLOOD PRESSURE SUPPORT): 80.0000 ug | PREFILLED_SYRINGE | INTRAVENOUS | Status: DC | PRN

## 2019-02-13 MED ORDER — DIBUCAINE (PERIANAL) 1 % EX OINT
1.0000 "application " | TOPICAL_OINTMENT | CUTANEOUS | Status: DC | PRN
  Administered 2019-02-14: 1 via RECTAL
  Filled 2019-02-13: qty 28

## 2019-02-13 MED ORDER — PRENATAL MULTIVITAMIN CH
1.0000 | ORAL_TABLET | Freq: Every day | ORAL | Status: DC
  Administered 2019-02-14 – 2019-02-15 (×2): 1 via ORAL
  Filled 2019-02-13 (×2): qty 1

## 2019-02-13 MED ORDER — PROMETHAZINE HCL 25 MG/ML IJ SOLN
25.0000 mg | Freq: Once | INTRAMUSCULAR | Status: AC
  Administered 2019-02-13: 25 mg via INTRAVENOUS
  Filled 2019-02-13: qty 1

## 2019-02-13 MED ORDER — ONDANSETRON HCL 4 MG/2ML IJ SOLN: 4.0000 mg | INTRAMUSCULAR | Status: DC | PRN

## 2019-02-13 MED ORDER — OXYCODONE-ACETAMINOPHEN 5-325 MG PO TABS: 2.0000 | ORAL_TABLET | ORAL | Status: DC | PRN

## 2019-02-13 MED ORDER — MORPHINE SULFATE (PF) 4 MG/ML IV SOLN
4.0000 mg | Freq: Once | INTRAVENOUS | Status: AC
  Administered 2019-02-13: 4 mg via INTRAVENOUS
  Filled 2019-02-13: qty 1

## 2019-02-13 MED ORDER — ONDANSETRON HCL 4 MG PO TABS: 4.0000 mg | ORAL_TABLET | ORAL | Status: DC | PRN

## 2019-02-13 MED ORDER — DIPHENHYDRAMINE HCL 50 MG/ML IJ SOLN: 12.5000 mg | INTRAMUSCULAR | Status: DC | PRN

## 2019-02-13 MED ORDER — FENTANYL CITRATE (PF) 100 MCG/2ML IJ SOLN
100.0000 ug | INTRAMUSCULAR | Status: DC | PRN
  Administered 2019-02-13: 100 ug via INTRAVENOUS
  Filled 2019-02-13: qty 2

## 2019-02-13 MED ORDER — WITCH HAZEL-GLYCERIN EX PADS
1.0000 "application " | MEDICATED_PAD | CUTANEOUS | Status: DC | PRN
  Administered 2019-02-14: 1 via TOPICAL

## 2019-02-13 MED ORDER — LIDOCAINE HCL (PF) 1 % IJ SOLN: 30.0000 mL | INTRAMUSCULAR | Status: DC | PRN

## 2019-02-13 MED ORDER — SENNOSIDES-DOCUSATE SODIUM 8.6-50 MG PO TABS
2.0000 | ORAL_TABLET | ORAL | Status: DC
  Administered 2019-02-13 – 2019-02-14 (×2): 2 via ORAL
  Filled 2019-02-13 (×2): qty 2

## 2019-02-13 MED ORDER — LACTATED RINGERS IV SOLN: 500.0000 mL | Freq: Once | INTRAVENOUS | Status: DC

## 2019-02-13 MED ORDER — BENZOCAINE-MENTHOL 20-0.5 % EX AERO
1.0000 "application " | INHALATION_SPRAY | CUTANEOUS | Status: DC | PRN
  Administered 2019-02-13: 1 via TOPICAL
  Filled 2019-02-13: qty 56

## 2019-02-13 MED ORDER — TETANUS-DIPHTH-ACELL PERTUSSIS 5-2.5-18.5 LF-MCG/0.5 IM SUSP: 0.5000 mL | Freq: Once | INTRAMUSCULAR | Status: DC

## 2019-02-13 MED ORDER — FENTANYL-BUPIVACAINE-NACL 0.5-0.125-0.9 MG/250ML-% EP SOLN
12.0000 mL/h | EPIDURAL | Status: DC | PRN
  Filled 2019-02-13: qty 250

## 2019-02-13 NOTE — MAU Note (Signed)
.  [name] [name] is a 34 y.o. at [redacted]w[redacted]d here in MAU reporting: SROM at 1130. No VB. + fetal movement. Patient reports no problems with this pregnancy.  Pain score: 7  FHT:143 Lab orders placed from triage:

## 2019-02-13 NOTE — H&P (Signed)
[name] [name] is a 34 y.o. female, G2P1001, IUP at 40 weeks, presenting for SROM , clear at 2300 on 02/13/2019. H/O previa during this pregnancy which resolved in October. H/O anemia on iron 9.9 hgb noted at 18 weeks.  BPP 8/8 on 11/10. H/O downs child in family. Low risk female. GBS-. Desires epidural.  Pt endorse + Fm. Denies vaginal bleeding.   Patient Active Problem List   Diagnosis Date Noted  . Right upper quadrant abdominal pain of unknown etiology 10/04/2018  . Dizziness 11/09/2015  . Pelvic pain 11/09/2015  . Positive pregnancy test 11/09/2015  . Vitamin D deficiency 03/07/2014  . Tinea pedis of both feet 03/06/2014  . Chronic pain syndrome 03/06/2014  . Acne 06/06/2011  . Hypertriglyceridemia 05/07/2011  . Language Barrier (Arabic) 03/14/2011  . GERD (gastroesophageal reflux disease) 02/19/2011  . External hemorrhoid 02/19/2011     Medications Prior to Admission  Medication Sig Dispense Refill Last Dose  . acetaminophen (TYLENOL) 500 MG tablet Take 500 mg by mouth every 6 (six) hours as needed.   02/13/2019 at Unknown time  . docusate sodium (COLACE) 100 MG capsule Take 1 capsule (100 mg total) by mouth 3 (three) times daily with meals. 60 capsule 0 Past Week at Unknown time  . omeprazole (PRILOSEC) 40 MG capsule Take 1 capsule (40 mg total) by mouth daily. 14 capsule 0 02/12/2019 at Unknown time  . hydrocortisone cream 1 % Apply to affected area 2 times daily 14 g 0   . promethazine (PHENERGAN) 25 MG suppository Place 1 suppository (25 mg total) rectally every 6 (six) hours as needed for nausea or vomiting. 12 each 1     Past Medical History:  Diagnosis Date  . Anemia 2009  . Anxiety   . Back pain 2010  . Exposure to x-rays 2012   normal  . H. pylori infection 2019  . H/O candidiasis   . H/O varicella   . History of gastroesophageal reflux (GERD) 2010   taking with tums, helps   . Hypotension   . Migraines 2009  . Sleep apnea 2012     No current  facility-administered medications on file prior to encounter.   Current Outpatient Medications on File Prior to Encounter  Medication Sig Dispense Refill  . acetaminophen (TYLENOL) 500 MG tablet Take 500 mg by mouth every 6 (six) hours as needed.    . docusate sodium (COLACE) 100 MG capsule Take 1 capsule (100 mg total) by mouth 3 (three) times daily with meals. 60 capsule 0  . omeprazole (PRILOSEC) 40 MG capsule Take 1 capsule (40 mg total) by mouth daily. 14 capsule 0  . hydrocortisone cream 1 % Apply to affected area 2 times daily 14 g 0  . promethazine (PHENERGAN) 25 MG suppository Place 1 suppository (25 mg total) rectally every 6 (six) hours as needed for nausea or vomiting. 12 each 1     Allergies  Allergen Reactions  . Other Anxiety and Rash    Medication for sleep given while inpatient; doesn't remember name, no matching medications listed on chart history.  Possibly ambien, lunesta, benadryl, doxylamine.    History of present pregnancy: Pt Info/Preference:  Screening/Consents:  Labs:   EDD: Estimated Date of Delivery: 02/13/19  Establised: No LMP recorded. Patient is pregnant.  Anatomy Scan: Date: 09/22/2019 Placenta Location: previa Genetic Screen: Panoroma:low risk female AFP:  First Tri: Quad:  Office: [facility]            First PNV: 7.4 wg  Blood Type  A-  Language: English  Last PNV: 39.2 wg Rhogam  Given at 28 weeks  Flu Vaccine:  declined   Antibody  neg  TDaP vaccine declined   GTT: Early: WNL Third Trimester: WNL  Feeding Plan: breast BTL: no Rubella:  immune  Contraception: ??? VBAC: No RPR:   nr  Circumcision: N/A   HBsAg:  neg  Pediatrician:  ???   HIV:   nr  Prenatal Classes: no Additional [place]: 11/10 BPP 8/8 October previa resolved  GBS:  Negative(For PCN allergy, check sensitivities)       Chlamydia: neg    MFM Referral/Consult:  GC: neg  Support Person: husband   PAP: ???  Pain Management: epidural Neonatologist Referral:  Hgb Electrophoresis:  AA  Birth Plan:  none   Hgb NOB: 12.9    28W: 9.9   OB History    Gravida  2   Para  1   Term  1   Preterm  0   AB  0   Living  1     SAB  0   TAB  0   Ectopic  0   Multiple  0   Live Births  1          Past Medical History:  Diagnosis Date  . Anemia 2009  . Anxiety   . Back pain 2010  . Exposure to x-rays 2012   normal  . H. pylori infection 2019  . H/O candidiasis   . H/O varicella   . History of gastroesophageal reflux (GERD) 2010   taking with tums, helps   . Hypotension   . Migraines 2009  . Sleep apnea 2012   Past Surgical History:  Procedure Laterality Date  . COLPOSCOPY  04/02/2011  . NO PAST SURGERIES     Family History: family history includes Asthma in her father and mother; Breast cancer in her maternal aunt and maternal grandmother; Breast cancer (age of onset: 70) in her mother; Cancer in her maternal grandmother and another family member; Cancer (age of onset: 75) in her mother; Diabetes in an other family member; Heart disease in her maternal grandmother and another family member; Hyperlipidemia in an other family member; Hypertension in her maternal aunt, maternal grandfather, maternal grandmother, and another family member; Stroke in an other family member; Thyroid disease in an other family member. Social History:  reports that she has never smoked. She has never used smokeless tobacco. She reports that she does not drink alcohol or use drugs.   Prenatal Transfer Tool  Maternal Diabetes: No Genetic Screening: Normal Maternal Ultrasounds/Referrals: Normal Fetal Ultrasounds or other Referrals:  None Maternal Substance Abuse:  No Significant Maternal Medications:  None Significant Maternal Lab Results: Group B Strep negative and Rh negative  ROS:  Review of Systems  Constitutional: Negative.   HENT: Negative.   Eyes: Negative.   Respiratory: Negative.   Cardiovascular: Negative.   Gastrointestinal: Positive for abdominal pain.  Genitourinary:        Vaginal leakage   Musculoskeletal: Negative.   Skin: Negative.   Neurological: Negative.   Endo/Heme/Allergies: Negative.   Psychiatric/Behavioral: Negative.      Physical Exam: BP 132/84 (BP Location: Right Arm)   Pulse 92   Temp (!) 97.4 F (36.3 C) (Oral)   Resp 19   Ht 5\' 4"  (1.626 m)   Wt 79.2 kg   BMI 29.95 kg/m   Physical Exam  Constitutional: She is oriented to person, place, and time  and well-developed, well-nourished, and in no distress.  HENT:  Head: Normocephalic and atraumatic.  Eyes: Pupils are equal, round, and reactive to light. Conjunctivae are normal.  Cardiovascular: Normal rate and regular rhythm.  Pulmonary/Chest: Effort normal and breath sounds normal.  Abdominal: Soft. Bowel sounds are normal.  Genitourinary:    Genitourinary Comments: Pelvis adequate, uterus gravida equal to dates, soft non-tender   Musculoskeletal:        General: Normal range of motion.     Cervical back: Normal range of motion and neck supple.  Neurological: She is alert and oriented to person, place, and time.  Skin: Skin is warm and dry.  Psychiatric: Affect normal.  Nursing note and vitals reviewed.    NST: FHR baseline 125 bpm, Variability: moderate with moments of minimals, Accelerations:present, Decelerations:  Absent= Cat 1/Reactive UC:   irregular, every 3-4 minutes, lasting 40-60 seoncds SVE:   Dilation: 2.5 Effacement (%): 50 Station: -3 Exam by:: Maryagnes Amos, RN, vertex verified by fetal sutures.  Leopold's: Position vertex, EFW 7lbs via leopold's.   Labs: Results for orders placed or performed during the hospital encounter of 02/13/19 (from the past 24 hour(s))  POCT fern test     Status: None   Collection Time: 02/13/19 12:46 AM  Result Value Ref Range   POCT Fern Test Positive = ruptured amniotic membanes     Imaging:  No results found.  MAU Course: Orders Placed This Encounter  Procedures  . Contraction - monitoring  . External fetal heart  monitoring  . Vaginal exam  . POCT fern test   No orders of the defined types were placed in this encounter.   Assessment/Plan: [name] [name] is a 34 y.o. female, G2P1001, IUP at 40 weeks, presenting for SROM , clear at 2300 on 02/13/2019. H/O previa during this pregnancy which resolved in October. H/O anemia on iron 9.9 hgb noted at 18 weeks.  BPP 8/8 on 11/10. H/O downs child in family. Low risk female. GBS-. Desires epidural.  Pt endorse + Fm. Denies vaginal bleeding.  FWB: Cat 1 Fetal Tracing.   Plan: Admit to [facility] per consult with Dr Mancel Bale Routine [facility] orders Pain med/epidural prn Anticipate labor progression   Noralyn Pick NP-C, CNM, MSN 02/13/2019, 12:55 AM

## 2019-02-13 NOTE — Anesthesia Postprocedure Evaluation (Signed)
Anesthesia Post Note  Patient: [name] [name]  Procedure(s) Performed: AN AD HOC LABOR EPIDURAL     Patient location during evaluation: Mother Baby Anesthesia Type: Epidural Level of consciousness: awake and alert Pain management: pain level controlled Vital Signs Assessment: post-procedure vital signs reviewed and stable Respiratory status: spontaneous breathing, nonlabored ventilation and respiratory function stable Cardiovascular status: stable Postop Assessment: no headache, no backache and epidural receding Anesthetic complications: no    Last Vitals:  Vitals:   02/13/19 1330 02/13/19 1424  BP: 109/67 111/81  Pulse: 88 93  Resp: 16 17  Temp: 37.4 C 36.6 C  SpO2: 99% 100%    Last Pain:  Vitals:   02/13/19 1431  TempSrc:   PainSc: 3                  [name]

## 2019-02-13 NOTE — Lactation Note (Signed)
This note was copied from a baby's chart. Lactation Consultation Note  Patient Name: [name] [name] [id number] Date: 02/13/2019 Reason for consult: Initial assessment;Term P2, 9 hour female infant. Mom with hx of anemia in pregnancy. Per mom, she breastfed her 35 year old daughter for only 3 weeks due to:  a lack of breastfeeding support, infant crying,  poor latch and using formula with breastfeeding. LC discussed with mom that we have [place] hotline, [place] out patient , breastfeeding support group in Mother baby booklet to help assist her with any breastfeeding concerns she may have when she is discharged from the hospital and we are here to help her while she is here.  This is infant's 4th time latching at breast. Per mom, she has been feel pain when infant is latching at breast. [place] assisted mom with latch. Mom latched infant on left breast using the football hold position, LC asked mom to hand express a small amount of colostrum out of breast prior to latching infant, rub breast below infant's nose, bring infant to breast, chin first  Waiting until infant's mouth is wide, tongue down. Infant latched with chin and nose touching breast, LC observed swallows, per mom, she did not feel pain with latch or pinching. Mom feels infant had been previous latching only on her nipple tip. LC ask mom to break latch. LC had mom to re-latch infant without LC assistance, after a few attempts mom latched infant herself and was confident with latching infant herself and infant breastfeed for 15 minutes and appeared content after the feeding. Mom knows to breastfeed infant according to hunger cues, 8 to 12 times, on demand and not to exceed 3 hours without breastfeeding infant. Mom knows to ask RN or [place] if she needs assistance with latching infant at breast. Mom was given hand pump due to not having a breast pump at home. Mom was shown how to use DEBP & how to disassemble, clean, & reassemble parts. Reviewed Baby & Me  book's Breastfeeding Basics. . Mom made aware of O/P services, breastfeeding support groups, community resources, and our phone # for post-discharge questions.   Maternal Data Formula Feeding for Exclusion: No Has patient been taught Hand Expression?: Yes Does the patient have breastfeeding experience prior to this delivery?: Yes  Feeding Feeding Type: Breast Fed  LATCH Score Latch: Grasps breast easily, tongue down, lips flanged, rhythmical sucking.  Audible Swallowing: Spontaneous and intermittent  Type of Nipple: Everted at rest and after stimulation  Comfort (Breast/Nipple): Soft / non-tender  Hold (Positioning): Assistance needed to correctly position infant at breast and maintain latch.  LATCH Score: 9  Interventions Interventions: Breast feeding basics reviewed;Breast compression;Assisted with latch;Adjust position;Hand pump;Skin to skin;Support pillows;Breast massage;Position options;Hand express;Expressed milk  Lactation Tools Discussed/Used WIC Program: No Pump Review: Setup, frequency, and cleaning;Milk Storage Initiated by:: Vicente Serene, IBCLC Date initiated:: 02/13/19   Consult Status Consult Status: Follow-up Date: 02/14/19 Follow-up type: In-patient    Vicente Serene 02/13/2019, 9:34 PM

## 2019-02-13 NOTE — Anesthesia Procedure Notes (Signed)
Epidural Patient location during procedure: OB Start time: 02/13/2019 8:21 AM End time: 02/13/2019 8:24 AM  Staffing Anesthesiologist: Janeece Riggers, MD  Preanesthetic Checklist Completed: patient identified, IV checked, site marked, risks and benefits discussed, surgical consent, monitors and equipment checked, pre-op evaluation and timeout performed  Epidural Patient position: sitting Prep: DuraPrep and site prepped and draped Patient monitoring: continuous pulse ox and blood pressure Approach: midline Location: L3-L4 Injection technique: LOR air  Needle:  Needle type: Tuohy  Needle gauge: 17 G Needle length: 9 cm and 9 Needle insertion depth: 7 cm Catheter type: closed end flexible Catheter size: 19 Gauge Catheter at skin depth: 12 cm Test dose: negative  Assessment Events: blood not aspirated, injection not painful, no injection resistance, no paresthesia and negative IV test

## 2019-02-13 NOTE — Anesthesia Preprocedure Evaluation (Signed)
Anesthesia Evaluation  Patient identified by MRN, date of birth, ID band Patient awake    Reviewed: Allergy & Precautions, H&P , NPO status , Patient's Chart, lab work & pertinent test results, reviewed documented beta blocker date and time   Airway Mallampati: II  TM Distance: >3 FB Neck ROM: full    Dental no notable dental hx. (+) Teeth Intact, Dental Advisory Given   Pulmonary neg pulmonary ROS,    Pulmonary exam normal breath sounds clear to auscultation       Cardiovascular negative cardio ROS Normal cardiovascular exam Rhythm:regular Rate:Normal     Neuro/Psych negative neurological ROS  negative psych ROS   GI/Hepatic Neg liver ROS, GERD  ,  Endo/Other  negative endocrine ROS  Renal/GU negative Renal ROS  negative genitourinary   Musculoskeletal   Abdominal   Peds  Hematology  (+) Blood dyscrasia, anemia ,   Anesthesia Other Findings   Reproductive/Obstetrics (+) Pregnancy                             Anesthesia Physical Anesthesia Plan  ASA: II  Anesthesia Plan: Epidural   Post-op Pain Management:    Induction:   PONV Risk Score and Plan:   Airway Management Planned:   Additional Equipment:   Intra-op Plan:   Post-operative Plan:   Informed Consent: I have reviewed the patients History and Physical, chart, labs and discussed the procedure including the risks, benefits and alternatives for the proposed anesthesia with the patient or authorized representative who has indicated his/her understanding and acceptance.     Dental Advisory Given  Plan Discussed with: Anesthesiologist  Anesthesia Plan Comments: (Labs checked- platelets confirmed with RN in room. Fetal heart tracing, per RN, reported to be stable enough for sitting procedure. Discussed epidural, and patient consents to the procedure:  included risk of possible headache,backache, failed block, allergic  reaction, and nerve injury. This patient was asked if she had any questions or concerns before the procedure started.)        Anesthesia Quick Evaluation

## 2019-02-13 NOTE — Progress Notes (Signed)
Labor Progress Note  [name] [name] is a 34 y.o. female, G2P1001, IUP at 40 weeks, presenting for SROM , clear at 2300 on 02/13/2019. H/O previa during this pregnancy which resolved in October. H/O anemia on iron 9.9 hgb noted at 18 weeks.  BPP 8/8 on 11/10. H/O downs child in family. Low risk female. GBS-. Desires epidural.  Pt endorse + Fm. Denies vaginal bleeding.   Subjective: Pt stable in bed, now comfortable with epidural, progressing now in active labor. Husband at bedside and supportive.  Patient Active Problem List   Diagnosis Date Noted  . Normal labor and delivery 02/13/2019  . Right upper quadrant abdominal pain of unknown etiology 10/04/2018  . Dizziness 11/09/2015  . Pelvic pain 11/09/2015  . Positive pregnancy test 11/09/2015  . Vitamin D deficiency 03/07/2014  . Tinea pedis of both feet 03/06/2014  . Chronic pain syndrome 03/06/2014  . Acne 06/06/2011  . Hypertriglyceridemia 05/07/2011  . Language Barrier (Arabic) 03/14/2011  . GERD (gastroesophageal reflux disease) 02/19/2011  . External hemorrhoid 02/19/2011   Objective: BP 117/63   Pulse 96   Temp 98 F (36.7 C) (Axillary)   Resp 16   Ht 5\' 4"  (1.626 m)   Wt 79.2 kg   SpO2 100%   BMI 29.95 kg/m  No intake/output data recorded. No intake/output data recorded. NST: FHR baseline 130 bpm, Variability: moderate, Accelerations:present, Decelerations:  Absent= Cat 1/Reactive CTX:  irregular, every 2-3 minutes, lasting 60-80 seconds Uterus gravid, soft non tender, moderate to palpate with contractions.  SVE:  Dilation: 5.5 Effacement (%): 90 Station: 0 Exam by:: Noralyn Pick, CNM Pitocin at (Not started) mUn/min  Assessment:  [name] [name] is a 34 y.o. female, G2P1001, IUP at 40 weeks, presenting for SROM , clear at 2300 on 02/13/2019. H/O previa during this pregnancy which resolved in October. H/O anemia on iron 9.9 hgb noted at 18 weeks.  BPP 8/8 on 11/10. H/O downs child in family. Low risk female. GBS-.  Desires epidural.  Pt endorse + Fm. Denies vaginal bleeding. Pt progressing in active labor off one oral Cytotec, pt comfortable with epidural.  Patient Active Problem List   Diagnosis Date Noted  . Normal labor and delivery 02/13/2019  . Right upper quadrant abdominal pain of unknown etiology 10/04/2018  . Dizziness 11/09/2015  . Pelvic pain 11/09/2015  . Positive pregnancy test 11/09/2015  . Vitamin D deficiency 03/07/2014  . Tinea pedis of both feet 03/06/2014  . Chronic pain syndrome 03/06/2014  . Acne 06/06/2011  . Hypertriglyceridemia 05/07/2011  . Language Barrier (Arabic) 03/14/2011  . GERD (gastroesophageal reflux disease) 02/19/2011  . External hemorrhoid 02/19/2011   NICHD: Category 1  Membranes:  SROM @ 2330 on 2/6 x 9hrs, no s/s of infection  Induction:    Cytotec x 69mcg PO @ 0426  Pitocin - No start yet  Pain management:               IV pain management: x2 doses @ 0149 (4mg  morphine with 25 phenergan) @ 0605  (135mcg fentanyl)             Epidural placement:  at 0830 on 2/7  GBS Negative  Plan: Continue labor plan Continuous monitoring Rest Frequent position changes to facilitate fetal rotation and descent. Will reassess with cervical exam at 1200 or earlier if necessary Anticipate pitocin per protocol if cxt slow or no cervical change at next check Anticipate labor progression and vaginal delivery.   [facility] aware of plan  and verbalized agreement.   Noralyn Pick, NP-C, CNM, MSN 02/13/2019. 8:56 AM

## 2019-02-14 LAB — CBC
HCT: 24.6 % — ABNORMAL LOW (ref 36.0–46.0)
Hemoglobin: 7.6 g/dL — ABNORMAL LOW (ref 12.0–15.0)
MCH: 25.4 pg — ABNORMAL LOW (ref 26.0–34.0)
MCHC: 30.9 g/dL (ref 30.0–36.0)
MCV: 82.3 fL (ref 80.0–100.0)
Platelets: 199 10*3/uL (ref 150–400)
RBC: 2.99 MIL/uL — ABNORMAL LOW (ref 3.87–5.11)
RDW: 14.5 % (ref 11.5–15.5)
WBC: 11.4 10*3/uL — ABNORMAL HIGH (ref 4.0–10.5)
nRBC: 0 % (ref 0.0–0.2)

## 2019-02-14 NOTE — Lactation Note (Signed)
This note was copied from a baby's chart. Lactation Consultation Note  Patient Name: [name] [name] [id number] Date: 02/14/2019 Reason for consult: Follow-up assessment;Difficult latch;Term  P2 mother whose infant is now 62 hours old.  This is a term baby.  Mother breast fed her first child (now 35 years old) for only 3 weeks due to breast feeding difficulties.  Baby was swaddled and asleep when I arrived.  Mother concerned that her nipples hurt and she is not sure why.  After speaking with mother about the previous latch, I determined that baby is probably only getting on the nipple tip and not deep into the breast tissue.  While educating mother on breast feeding basics baby woke up and started crying; no cues noted.  Offered to assist with latching and mother agreeable.  Mother's breasts are large, soft and non tender and nipples are short shafted and everted.  Nipples are sensitive but intact.  Mother demonstrated hand expression and colostrum easily expressed.  I demonstrated finger feeding and baby has a very tight suck.  Performed suck training prior to latching.  Positioned mother in the football hold and assisted baby to latch without difficulty.  She had a wide gape and flanged lips.  Her tongue extends nicely and showed mother how to obtain a deep latch.  Allowed baby to begin sucking and mother felt a good tug at her breast.  Discussed upper body relaxation and baby continued to suck with gentle stimulation.  Mother denied pain with sucking.  Observed her feeding until she became very restless and burped her.  Placed her STS on mother's chest, however, she began showing cues this time.  Placed her back on the breast where she continued to suck.  Mother pleased and very appreciative.  Observed baby feeding for 15 minutes prior to my departure.  Encouraged to feed on cue or at least 8-12 times/24 hours.  She will perform hand expression prior to latching and call her RN/LC for assistance as needed.     Mother does not have a DEBP for home use but is planning on calling the [facility] office in [place] this a.m. to determine eligibility.  Father present and supportive.  RN updated.   Maternal Data Formula Feeding for Exclusion: No Has patient been taught Hand Expression?: Yes Does the patient have breastfeeding experience prior to this delivery?: Yes  Feeding Feeding Type: Breast Fed  LATCH Score Latch: Grasps breast easily, tongue down, lips flanged, rhythmical sucking.  Audible Swallowing: None  Type of Nipple: Everted at rest and after stimulation(short shafted)  Comfort (Breast/Nipple): Soft / non-tender  Hold (Positioning): Assistance needed to correctly position infant at breast and maintain latch.  LATCH Score: 7  Interventions Interventions: Breast feeding basics reviewed;Assisted with latch;Skin to skin;Breast massage;Hand express;Breast compression;Adjust position;Coconut oil;Position options;Support pillows  Lactation Tools Discussed/Used WIC Program: Yes   Consult Status Consult Status: Follow-up Date: 02/15/19 Follow-up type: In-patient    Reece Mcbroom R Alize Acy 02/14/2019, 9:08 AM

## 2019-02-14 NOTE — Progress Notes (Signed)
PPD# 1 SVD w/ intact perineum Information for the patient's newborn:  Tem[place], [place] [id number]  female    Baby Name Zaina Circumcision N/A   S:   Reports feeling good Tolerating PO fluid and solids No nausea or vomiting Bleeding is light Pain controlled with PO pain meds Up ad lib / ambulatory / voiding w/o difficulty Feeding: Breast    O:   VS: BP 106/68 (BP Location: Left Arm)   Pulse 78   Temp 98 F (36.7 C) (Oral)   Resp 16   Ht 5\' 4"  (1.626 m)   Wt 79.2 kg   SpO2 100%   Breastfeeding Unknown   BMI 29.95 kg/m   LABS:  Recent Labs    02/13/19 0101 02/14/19 0408  WBC 10.1 11.4*  HGB 9.4* 7.6*  PLT 255 199   Blood type: --/--/A NEG (02/08 0408) Rubella: Immune (06/16 0000)                      I&O: Intake/Output      02/07 0701 - 02/08 0700 02/08 0701 - 02/09 0700   Urine (mL/kg/hr) 500 (0.3)    Blood 375    Total Output 875    Net -875           Physical Exam: Alert and oriented X3 Lungs: Clear and unlabored Heart: regular rate and rhythm / no mumurs Abdomen: soft, non-tender, non-distended  Fundus: firm, non-tender Perineum: intact, non-edematous Lochia: appropriate Extremities: trace edema, no calf pain or tenderness    A:  PPD # 1  Normal exam  P:  Routine post partum orders Anticipate D/C on 02-15-19    Arrie Eastern, MSN, CNM 02/14/2019, 8:35 AM

## 2019-02-14 NOTE — Progress Notes (Signed)
[name] was referred for history of depression/anxiety. * Referral screened out by Clinical Social Worker because none of the following criteria appear to apply: ~ History of anxiety/depression during this pregnancy, or of post-partum depression following prior delivery. ~ Diagnosis of anxiety and/or depression within last 3 years. Per further chart review, CSW aware that [name] was diagnosed with anxiety in 2013.   OR * [name]'s symptoms currently being treated with medication and/or therapy.   Please contact the Clinical Social Worker if needs arise, by [facility] request, or if [name] scores greater than 9/yes to question 10 on [place] Postpartum Depression Screen.     [name] [name], MSW, LCSW [facility] [phone]

## 2019-02-14 NOTE — Anesthesia Postprocedure Evaluation (Signed)
Anesthesia Post Note  Patient: [name] [name]  Procedure(s) Performed: AN AD HOC LABOR EPIDURAL     Patient location during evaluation: Mother Baby Anesthesia Type: Epidural Level of consciousness: awake and alert Pain management: pain level controlled Vital Signs Assessment: post-procedure vital signs reviewed and stable Respiratory status: spontaneous breathing, nonlabored ventilation and respiratory function stable Cardiovascular status: stable Postop Assessment: no headache, no backache and epidural receding Anesthetic complications: no    Last Vitals:  Vitals:   02/13/19 2328 02/14/19 0530  BP: (!) 117/59 106/68  Pulse: 73 78  Resp: 16 16  Temp: 36.7 C 36.7 C  SpO2: 100% 100%    Last Pain:  Vitals:   02/14/19 1059  TempSrc:   PainSc: 0-No pain   Pain Goal: Patients Stated Pain Goal: 3 (02/14/19 0955)                 Barkley Boards

## 2019-02-14 NOTE — Addendum Note (Signed)
Addendum  created 02/14/19 1154 by Ignacia Bayley, CRNA   Clinical Note Signed

## 2019-02-15 LAB — RH IG WORKUP (INCLUDES ABO/RH)
ABO/RH(D): A NEG
Fetal Screen: NEGATIVE
Gestational Age(Wks): 40
Unit division: 0

## 2019-02-15 MED ORDER — IBUPROFEN 600 MG PO TABS: 600.0000 mg | ORAL_TABLET | Freq: Four times a day (QID) | ORAL | 0 refills | Status: DC

## 2019-02-15 NOTE — Lactation Note (Addendum)
This note was copied from a baby's chart. Lactation Consultation Note  Patient Name: [name] [name] [id number] Date: 02/15/2019   I concur with Domenica Reamer, Lactation student's note. Also, I attempted to teach Mom how to cup feed, but when I asked her about other feeding modes, she quickly expressed interest in bottle feeding as a supplement method. Infant did very well with the bottle feeding with the extra-slow flow nipple.   B/c of compression stripes bilaterally, specifics of an asymmetric latch shown via [employer].   Comfort Gels were provided w/instructions for use. Mom is large-breasted and the bra she brought from home was not adequate for wearing Comfort Gels. I created a "bra" made from the mesh panties so that Mom could wear the Comfort Gels with ease.   Shortly after the bottle feeding, infant began cueing for more food. A bottle of DBM was obtained, but infant had fallen asleep by the time I returned. I spoke with parents about only offering needed portion and how to warm it up if needed before Mom expressed her milk again.  Mom was thankful for the time [place] spent with her.    Matthias Hughs [facility] 02/15/2019, 2:55 PM

## 2019-02-15 NOTE — Lactation Note (Signed)
This note was copied from a baby's chart. Lactation Consultation Note  Patient Name: [name] [name] [id number] Date: 02/15/2019   [facility] student entered room to find baby sleeping in mom's arms.  Mom expressed concerns with milk supply and painful latch. Mom asked if she could pump while her nipples heal and still provide her breast milk after advising that she was only able to nurse her first baby for 3 weeks (7 years ago) and expressed that she really wants to breastfeed this time.  LC viewed mom's anatomy and found that she did show bilateral compression stripes and advised that we can assist with the next latch.  Mom stated that she was ready to BF now, as baby had not BF since 7 am. Mom undressed baby while [facility] student assisted mom in getting baby positioned in the football hold. Baby latched and then fall back to sleep after several attempts.  Mom winced in pain with initial latch but was able to relax.  LC suggested that mom try side lying position and [facility] student assisted with getting mom comfortable. Mom seemed to prefer this position but baby still appeared to fall asleep quickly. [facility] student assisted mom with hand expression and mom was able to express colostrum with ease.  [place] student set up [place], after parents stated that they will be picking up their pump from [facility] today.  [facility] student advised parents of how to set up and clean the parts. [facility] student got mom set up to pump and she was able to express more colostrum. LC assisted FOB with feeding baby the hand expressed milk using a cup. Mom requested a bottle instead to feed the milk she pumped.  LC gave mom a purple slow-flow nipple and baby took 18ml with the bottle.    Mom was advised to pump again in a couple of hours and LC student explained the need to stimulate breasts by pumping, bringing baby to breast and/or hand expressing to encourage and maintain supply. [facility] student advised mom of outpatient support and phone line for questions after  discharge.  Clint Guy 02/15/2019, 1:57 PM

## 2019-02-15 NOTE — Discharge Summary (Signed)
    Postpartum Discharge Summary     Patient Name: [name] [name] DOB: May 30, 1985 MRN: [id number]  Date of admission: 02/13/2019 Delivering Provider: Noralyn Pick   Date of discharge: 02/15/2019  [name] [name] is a 34 year old G3P2012 who presented with SROM and active labor at 40.0 weeks.  She progressed naturally without augmentation SVD 02/13/19 at 1141 Intact Perineum Baby Girl Hgb 9.4-7.6, asymptomatic. Ready for discharge     Physical exam  Vitals:   02/14/19 1442 02/14/19 2131 02/15/19 0537 02/15/19 0607  BP: (!) 120/57 101/68 (!) 90/57 96/67  Pulse: 73 77 60 67  Resp: 18 18 18 18   Temp: 98.3 F (36.8 C) 98.5 F (36.9 C) (!) 97.5 F (36.4 C) 97.7 F (36.5 C)  TempSrc: Oral Oral Oral Oral  SpO2: 100%     Weight:      Height:       General: alert, cooperative and no distress Lochia: appropriate Uterine Fundus: firm Incision: N/A DVT Evaluation: No evidence of DVT seen on physical exam. Negative Homan's sign. Labs: Lab Results  Component Value Date   WBC 11.4 (H) 02/14/2019   HGB 7.6 (L) 02/14/2019   HCT 24.6 (L) 02/14/2019   MCV 82.3 02/14/2019   PLT 199 02/14/2019    Discharge instruction: per After Visit Summary and "Baby and Me Booklet".  After visit meds:  Allergies as of 02/15/2019      Reactions   Other Anxiety, Rash   Medication for sleep given while inpatient; doesn't remember name, no matching medications listed on chart history.  Possibly ambien, lunesta, benadryl, doxylamine.      Medication List    STOP taking these medications   acetaminophen 500 MG tablet Commonly known as: TYLENOL   docusate sodium 100 MG capsule Commonly known as: COLACE   hydrocortisone cream 1 %   Linzess 145 MCG Caps capsule Generic drug: linaclotide   omeprazole 40 MG capsule Commonly known as: PriLOSEC   prenatal multivitamin Tabs tablet   promethazine 25 MG suppository Commonly known as: Phenergan     TAKE these medications   ibuprofen 600 MG  tablet Commonly known as: ADVIL Take 1 tablet (600 mg total) by mouth every 6 (six) hours.       Diet: routine diet Activity: Advance as tolerated. Pelvic rest for 6 weeks.  Contraception: Condoms Outpatient follow up:6 weeks Follow up Visit: Follow-up Information    Ob/Gyn, [place] Follow up in 6 week(s).   Specialty: Obstetrics and Gynecology Contact information: [address] [place] [address] [phone]           Newborn Data: Live born female  Birth Weight: 7 lb 1.6 oz (3220 g) APGAR: 9, 9  Baby Feeding: Breast Disposition:home with mother   02/15/2019 Ike Bene, CNM

## 2019-02-16 LAB — TYPE AND SCREEN
ABO/RH(D): A NEG
Antibody Screen: POSITIVE
Unit division: 0
Unit division: 0

## 2019-02-16 LAB — BPAM RBC
Blood Product Expiration Date: 202103032359
Blood Product Expiration Date: 202103092359
Unit Type and Rh: 600
Unit Type and Rh: 600

## 2019-02-22 ENCOUNTER — Other Ambulatory Visit: Payer: Self-pay | Admitting: Obstetrics & Gynecology

## 2019-02-22 DIAGNOSIS — N939 Abnormal uterine and vaginal bleeding, unspecified: Secondary | ICD-10-CM

## 2019-02-22 DIAGNOSIS — M5431 Sciatica, right side: Secondary | ICD-10-CM | POA: Diagnosis not present

## 2019-02-22 DIAGNOSIS — O8612 Endometritis following delivery: Secondary | ICD-10-CM | POA: Diagnosis not present

## 2019-02-22 DIAGNOSIS — R5383 Other fatigue: Secondary | ICD-10-CM | POA: Diagnosis not present

## 2019-02-22 DIAGNOSIS — R102 Pelvic and perineal pain: Secondary | ICD-10-CM | POA: Diagnosis not present

## 2019-02-28 ENCOUNTER — Other Ambulatory Visit: Payer: Medicaid Other

## 2019-03-28 DIAGNOSIS — D649 Anemia, unspecified: Secondary | ICD-10-CM | POA: Diagnosis not present

## 2019-04-21 DIAGNOSIS — Z20828 Contact with and (suspected) exposure to other viral communicable diseases: Secondary | ICD-10-CM | POA: Diagnosis not present

## 2019-07-18 ENCOUNTER — Ambulatory Visit (HOSPITAL_COMMUNITY)
Admission: EM | Admit: 2019-07-18 | Discharge: 2019-07-18 | Disposition: A | Discharge status: Home / Self Care | Payer: Medicaid Other | Attending: Family Medicine | Admitting: Family Medicine

## 2019-07-18 ENCOUNTER — Other Ambulatory Visit: Payer: Self-pay

## 2019-07-18 ENCOUNTER — Encounter (HOSPITAL_COMMUNITY): Payer: Self-pay

## 2019-07-18 DIAGNOSIS — R52 Pain, unspecified: Secondary | ICD-10-CM

## 2019-07-18 DIAGNOSIS — R0989 Other specified symptoms and signs involving the circulatory and respiratory systems: Secondary | ICD-10-CM | POA: Diagnosis not present

## 2019-07-18 DIAGNOSIS — J029 Acute pharyngitis, unspecified: Secondary | ICD-10-CM

## 2019-07-18 DIAGNOSIS — J069 Acute upper respiratory infection, unspecified: Secondary | ICD-10-CM | POA: Insufficient documentation

## 2019-07-18 HISTORY — DX: COVID-19: U07.1

## 2019-07-18 LAB — POCT RAPID STREP A: Streptococcus, Group A Screen (Direct): NEGATIVE

## 2019-07-18 MED ORDER — FAMOTIDINE 20 MG PO TABS: 20.0000 mg | ORAL_TABLET | Freq: Two times a day (BID) | ORAL | 0 refills | Status: DC

## 2019-07-18 MED ORDER — FLUTICASONE PROPIONATE 50 MCG/ACT NA SUSP: 2.0000 | Freq: Every day | NASAL | 2 refills | Status: DC

## 2019-07-18 NOTE — Discharge Instructions (Signed)
Take tylenol for pain and fever Drink lots of water/fluids Use flonase for the nasal congestion Tale Pepcid AC for heartburn

## 2019-07-18 NOTE — ED Triage Notes (Signed)
Pt states she's [redacted] wks pregnant and had COVID 2 mos ago. Pt c/o nasal congestion and drainage, body aches, sore throat, loss of appetitex3 days.

## 2019-07-18 NOTE — ED Provider Notes (Addendum)
[name]    CSN: [id number] Arrival date & time: 07/18/19  1705      History   Chief Complaint Chief Complaint  Patient presents with  . runny nose, sore throat, body aches    HPI [name] [name] is a 34 y.o. female.   HPI  Patient states that her older daughter has a cold She now has cough cold runny nose sore throat, some body aches No fever or chills  she had Covid a couple months ago and does not think she has Covid again  She is [redacted] weeks pregnant   she wonders what she can take for her cold Has a history of GERD/H. pylori.  Would like a refill of her Pepcid Past Medical History:  Diagnosis Date  . Anemia 2009  . Anxiety   . Back pain 2010  . COVID-19   . Exposure to x-rays 2012   normal  . H. pylori infection 2019  . H/O candidiasis   . H/O varicella   . History of gastroesophageal reflux (GERD) 2010   taking with tums, helps   . Hypotension   . Migraines 2009  . Sleep apnea 2012    Patient Active Problem List   Diagnosis Date Noted  . Normal labor and delivery 02/13/2019  . Right upper quadrant abdominal pain of unknown etiology 10/04/2018  . Dizziness 11/09/2015  . Pelvic pain 11/09/2015  . Positive pregnancy test 11/09/2015  . Vitamin D deficiency 03/07/2014  . Tinea pedis of both feet 03/06/2014  . Chronic pain syndrome 03/06/2014  . Acne 06/06/2011  . Hypertriglyceridemia 05/07/2011  . Language Barrier (Arabic) 03/14/2011  . GERD (gastroesophageal reflux disease) 02/19/2011  . External hemorrhoid 02/19/2011    Past Surgical History:  Procedure Laterality Date  . COLPOSCOPY  04/02/2011  . NO PAST SURGERIES      OB History    Gravida  3   Para  2   Term  2   Preterm  0   AB  1   Living  2     SAB  0   TAB  1   Ectopic  0   Multiple  0   Live Births  2            Home Medications    Prior to Admission medications   Medication Sig Start Date End Date Taking? Authorizing Provider  famotidine  (PEPCID) 20 MG tablet Take 1 tablet (20 mg total) by mouth 2 (two) times daily. 07/18/19   Raylene Everts, MD  fluticasone [facility]) 50 MCG/ACT nasal spray Place 2 sprays into both nostrils daily. 07/18/19   Raylene Everts, MD    Family History Family History  Problem Relation Age of Onset  . Asthma Mother   . Cancer Mother 68       breast, cervix and bone and liver  . Breast cancer Mother 11  . Asthma Father   . Cancer Other   . Heart disease Other   . Stroke Other   . Thyroid disease Other   . Diabetes Other   . Hypertension Other   . Hyperlipidemia Other   . Hypertension Maternal Aunt   . Heart disease Maternal Grandmother   . Hypertension Maternal Grandmother   . Cancer Maternal Grandmother   . Breast cancer Maternal Grandmother   . Hypertension Maternal Grandfather   . Breast cancer Maternal Aunt   . Anesthesia problems Neg Hx     Social History Social History  Tobacco Use  . Smoking status: Never Smoker  . Smokeless tobacco: Never Used  Vaping Use  . Vaping Use: Never used  Substance Use Topics  . Alcohol use: No  . Drug use: No     Allergies   Other   Review of Systems Review of Systems See HPI  Physical Exam Triage Vital Signs ED Triage Vitals [07/18/19 1807]  Enc Vitals Group     BP 120/75     Pulse Rate 78     Resp 18     Temp 97.9 F (36.6 C)     Temp Source Oral     SpO2 100 %     Weight 160 lb (72.6 kg)     Height 5\' 3"  (1.6 m)     Head Circumference      Peak Flow      Pain Score 8     Pain Loc      Pain Edu?      Excl. in [place]?    No data found.  Updated Vital Signs BP 120/75   Pulse 78   Temp 97.9 F (36.6 C) (Oral)   Resp 18   Ht 5\' 3"  (1.6 m)   Wt 72.6 kg   SpO2 100%   BMI 28.34 kg/m      Physical Exam Constitutional:      General: She is not in acute distress.    Appearance: She is well-developed and normal weight.     Comments: Appears tired  HENT:     Head: Normocephalic and atraumatic.     Right  Ear: Tympanic membrane and ear canal normal.     Left Ear: Tympanic membrane and ear canal normal.     Nose: Congestion and rhinorrhea present.     Comments: Clear rhinorrhea    Mouth/Throat:     Mouth: Mucous membranes are moist.     Pharynx: No posterior oropharyngeal erythema.  Eyes:     Conjunctiva/sclera: Conjunctivae normal.     Pupils: Pupils are equal, round, and reactive to light.  Cardiovascular:     Rate and Rhythm: Normal rate and regular rhythm.  Pulmonary:     Effort: Pulmonary effort is normal. No respiratory distress.     Breath sounds: Normal breath sounds.     Comments: Lungs are clear Abdominal:     General: There is no distension.     Palpations: Abdomen is soft.  Musculoskeletal:        General: Normal range of motion.     Cervical back: Normal range of motion.  Lymphadenopathy:     Cervical: No cervical adenopathy.  Skin:    General: Skin is warm and dry.  Neurological:     Mental Status: She is alert.  Psychiatric:        Mood and Affect: Mood normal.        Behavior: Behavior normal.      UC Treatments / Results  Labs (all labs ordered are listed, but only abnormal results are displayed) Labs Reviewed  CULTURE, GROUP A STREP [facility])  POCT RAPID STREP A  Rapid strep negative  EKG   Radiology No results found.  Procedures Procedures (including critical care time)  Medications Ordered in UC Medications - No data to display  Initial Impression / Assessment and Plan / UC Course  I have reviewed the triage vital signs and the nursing notes.  Pertinent labs & imaging results that were available during my care of the patient were reviewed  by me and considered in my medical decision making (see chart for details).      Final Clinical Impressions(s) / UC Diagnoses   Final diagnoses:  Viral URI     Discharge Instructions     Take tylenol for pain and fever Drink lots of water/fluids Use flonase for the nasal congestion Tale Pepcid  AC for heartburn    ED Prescriptions    Medication Sig Dispense Auth. Provider   fluticasone (FLONASE) 50 MCG/ACT nasal spray Place 2 sprays into both nostrils daily. 16 g Raylene Everts, MD   famotidine (PEPCID) 20 MG tablet Take 1 tablet (20 mg total) by mouth 2 (two) times daily. 30 tablet Raylene Everts, MD     PDMP not reviewed this encounter.   Raylene Everts, MD 07/21/19 1043    Raylene Everts, MD 07/21/19 1044

## 2019-07-21 LAB — CULTURE, GROUP A STREP (THRC)

## 2019-07-25 DIAGNOSIS — O3680X9 Pregnancy with inconclusive fetal viability, other fetus: Secondary | ICD-10-CM | POA: Diagnosis not present

## 2019-07-25 DIAGNOSIS — Z3A11 11 weeks gestation of pregnancy: Secondary | ICD-10-CM | POA: Diagnosis not present

## 2019-07-27 LAB — OB RESULTS CONSOLE HEPATITIS B SURFACE ANTIGEN: Hepatitis B Surface Ag: NEGATIVE

## 2019-07-28 DIAGNOSIS — Z369 Encounter for antenatal screening, unspecified: Secondary | ICD-10-CM | POA: Diagnosis not present

## 2019-07-28 DIAGNOSIS — N925 Other specified irregular menstruation: Secondary | ICD-10-CM | POA: Diagnosis not present

## 2019-07-28 DIAGNOSIS — Z3481 Encounter for supervision of other normal pregnancy, first trimester: Secondary | ICD-10-CM | POA: Diagnosis not present

## 2019-07-28 DIAGNOSIS — E559 Vitamin D deficiency, unspecified: Secondary | ICD-10-CM | POA: Diagnosis not present

## 2019-07-28 DIAGNOSIS — R110 Nausea: Secondary | ICD-10-CM | POA: Diagnosis not present

## 2019-08-25 DIAGNOSIS — Z363 Encounter for antenatal screening for malformations: Secondary | ICD-10-CM | POA: Diagnosis not present

## 2019-08-25 DIAGNOSIS — E559 Vitamin D deficiency, unspecified: Secondary | ICD-10-CM | POA: Diagnosis not present

## 2019-08-25 DIAGNOSIS — D649 Anemia, unspecified: Secondary | ICD-10-CM | POA: Diagnosis not present

## 2019-08-25 DIAGNOSIS — Z113 Encounter for screening for infections with a predominantly sexual mode of transmission: Secondary | ICD-10-CM | POA: Diagnosis not present

## 2019-08-25 DIAGNOSIS — K219 Gastro-esophageal reflux disease without esophagitis: Secondary | ICD-10-CM | POA: Diagnosis not present

## 2019-09-22 ENCOUNTER — Other Ambulatory Visit: Payer: Self-pay | Admitting: Obstetrics & Gynecology

## 2019-09-22 DIAGNOSIS — O30009 Twin pregnancy, unspecified number of placenta and unspecified number of amniotic sacs, unspecified trimester: Secondary | ICD-10-CM

## 2019-10-10 ENCOUNTER — Encounter: Payer: Self-pay | Admitting: *Deleted

## 2019-10-11 ENCOUNTER — Other Ambulatory Visit: Payer: Self-pay

## 2019-10-11 ENCOUNTER — Ambulatory Visit: Payer: Medicaid Other | Attending: Obstetrics and Gynecology

## 2019-10-11 DIAGNOSIS — Z3A23 23 weeks gestation of pregnancy: Secondary | ICD-10-CM | POA: Diagnosis not present

## 2019-10-11 DIAGNOSIS — O09522 Supervision of elderly multigravida, second trimester: Secondary | ICD-10-CM

## 2019-10-11 DIAGNOSIS — Z3689 Encounter for other specified antenatal screening: Secondary | ICD-10-CM | POA: Diagnosis not present

## 2019-10-11 DIAGNOSIS — O3412 Maternal care for benign tumor of corpus uteri, second trimester: Secondary | ICD-10-CM | POA: Diagnosis not present

## 2019-10-11 DIAGNOSIS — Z8279 Family history of other congenital malformations, deformations and chromosomal abnormalities: Secondary | ICD-10-CM

## 2019-10-11 DIAGNOSIS — Z363 Encounter for antenatal screening for malformations: Secondary | ICD-10-CM

## 2019-10-11 DIAGNOSIS — O30009 Twin pregnancy, unspecified number of placenta and unspecified number of amniotic sacs, unspecified trimester: Secondary | ICD-10-CM | POA: Diagnosis not present

## 2019-10-11 DIAGNOSIS — D259 Leiomyoma of uterus, unspecified: Secondary | ICD-10-CM

## 2019-11-15 DIAGNOSIS — O26899 Other specified pregnancy related conditions, unspecified trimester: Secondary | ICD-10-CM | POA: Diagnosis not present

## 2019-11-15 DIAGNOSIS — O09513 Supervision of elderly primigravida, third trimester: Secondary | ICD-10-CM | POA: Diagnosis not present

## 2019-11-15 DIAGNOSIS — Z331 Pregnant state, incidental: Secondary | ICD-10-CM | POA: Diagnosis not present

## 2019-11-15 DIAGNOSIS — Z6711 Type A blood, Rh negative: Secondary | ICD-10-CM | POA: Diagnosis not present

## 2019-11-15 DIAGNOSIS — L299 Pruritus, unspecified: Secondary | ICD-10-CM | POA: Diagnosis not present

## 2019-11-15 DIAGNOSIS — Z3A28 28 weeks gestation of pregnancy: Secondary | ICD-10-CM | POA: Diagnosis not present

## 2019-11-15 DIAGNOSIS — Z3493 Encounter for supervision of normal pregnancy, unspecified, third trimester: Secondary | ICD-10-CM | POA: Diagnosis not present

## 2019-11-15 LAB — OB RESULTS CONSOLE HIV ANTIBODY (ROUTINE TESTING): HIV: NONREACTIVE

## 2019-11-21 ENCOUNTER — Telehealth: Payer: Self-pay | Admitting: Hematology

## 2019-11-21 NOTE — Telephone Encounter (Signed)
Received a new hem referral from Dr. Alwyn Pea for anemia. Ms. Gerstel has been cld and scheduled to see Dr. Irene Limbo on 12/2 at 240pm. Letter mailed.

## 2019-12-08 ENCOUNTER — Inpatient Hospital Stay: Payer: Medicaid Other

## 2019-12-08 ENCOUNTER — Inpatient Hospital Stay: Payer: Medicaid Other | Attending: Hematology | Admitting: Hematology

## 2019-12-08 ENCOUNTER — Other Ambulatory Visit: Payer: Self-pay

## 2019-12-08 VITALS — BP 119/71 | HR 97 | Temp 97.5°F | Resp 18 | Wt 175.3 lb

## 2019-12-08 DIAGNOSIS — Z8719 Personal history of other diseases of the digestive system: Secondary | ICD-10-CM | POA: Diagnosis not present

## 2019-12-08 DIAGNOSIS — G4700 Insomnia, unspecified: Secondary | ICD-10-CM | POA: Insufficient documentation

## 2019-12-08 DIAGNOSIS — R21 Rash and other nonspecific skin eruption: Secondary | ICD-10-CM | POA: Diagnosis not present

## 2019-12-08 DIAGNOSIS — K219 Gastro-esophageal reflux disease without esophagitis: Secondary | ICD-10-CM | POA: Diagnosis not present

## 2019-12-08 DIAGNOSIS — Z803 Family history of malignant neoplasm of breast: Secondary | ICD-10-CM | POA: Diagnosis not present

## 2019-12-08 DIAGNOSIS — Z79899 Other long term (current) drug therapy: Secondary | ICD-10-CM | POA: Insufficient documentation

## 2019-12-08 DIAGNOSIS — O99013 Anemia complicating pregnancy, third trimester: Secondary | ICD-10-CM

## 2019-12-08 DIAGNOSIS — Z331 Pregnant state, incidental: Secondary | ICD-10-CM | POA: Diagnosis not present

## 2019-12-08 DIAGNOSIS — E538 Deficiency of other specified B group vitamins: Secondary | ICD-10-CM | POA: Insufficient documentation

## 2019-12-08 DIAGNOSIS — Z8616 Personal history of COVID-19: Secondary | ICD-10-CM | POA: Diagnosis not present

## 2019-12-08 DIAGNOSIS — R42 Dizziness and giddiness: Secondary | ICD-10-CM | POA: Diagnosis not present

## 2019-12-08 DIAGNOSIS — D509 Iron deficiency anemia, unspecified: Secondary | ICD-10-CM | POA: Insufficient documentation

## 2019-12-08 LAB — CBC WITH DIFFERENTIAL/PLATELET
Abs Immature Granulocytes: 0.06 10*3/uL (ref 0.00–0.07)
Basophils Absolute: 0 10*3/uL (ref 0.0–0.1)
Basophils Relative: 0 %
Eosinophils Absolute: 0.1 10*3/uL (ref 0.0–0.5)
Eosinophils Relative: 1 %
HCT: 28 % — ABNORMAL LOW (ref 36.0–46.0)
Hemoglobin: 8.4 g/dL — ABNORMAL LOW (ref 12.0–15.0)
Immature Granulocytes: 1 %
Lymphocytes Relative: 24 %
Lymphs Abs: 2.3 10*3/uL (ref 0.7–4.0)
MCH: 23.5 pg — ABNORMAL LOW (ref 26.0–34.0)
MCHC: 30 g/dL (ref 30.0–36.0)
MCV: 78.4 fL — ABNORMAL LOW (ref 80.0–100.0)
Monocytes Absolute: 0.5 10*3/uL (ref 0.1–1.0)
Monocytes Relative: 5 %
Neutro Abs: 6.9 10*3/uL (ref 1.7–7.7)
Neutrophils Relative %: 69 %
Platelets: 250 10*3/uL (ref 150–400)
RBC: 3.57 MIL/uL — ABNORMAL LOW (ref 3.87–5.11)
RDW: 15 % (ref 11.5–15.5)
WBC: 9.9 10*3/uL (ref 4.0–10.5)
nRBC: 0 % (ref 0.0–0.2)

## 2019-12-08 LAB — CMP (CANCER CENTER ONLY)
ALT: <6 U/L (ref 0–44)
AST: 12 U/L — ABNORMAL LOW (ref 15–41)
Albumin: 3.2 g/dL — ABNORMAL LOW (ref 3.5–5.0)
Alkaline Phosphatase: 128 U/L — ABNORMAL HIGH (ref 38–126)
Anion gap: 8 (ref 5–15)
BUN: 4 mg/dL — ABNORMAL LOW (ref 6–20)
CO2: 21 mmol/L — ABNORMAL LOW (ref 22–32)
Calcium: 8.9 mg/dL (ref 8.9–10.3)
Chloride: 108 mmol/L (ref 98–111)
Creatinine: 0.59 mg/dL (ref 0.44–1.00)
GFR, Estimated: >60 mL/min (ref >60)
Glucose, Bld: 94 mg/dL (ref 70–99)
Potassium: 3.7 mmol/L (ref 3.5–5.1)
Sodium: 137 mmol/L (ref 135–145)
Total Bilirubin: 0.8 mg/dL (ref 0.3–1.2)
Total Protein: 8 g/dL (ref 6.5–8.1)

## 2019-12-08 LAB — ABO/RH: ABO/RH(D): A NEG

## 2019-12-08 LAB — SAMPLE TO BLOOD BANK

## 2019-12-08 LAB — VITAMIN B12: Vitamin B-12: 93 pg/mL — ABNORMAL LOW (ref 180–914)

## 2019-12-08 NOTE — Progress Notes (Signed)
HEMATOLOGY/ONCOLOGY CONSULTATION NOTE  Date of Service: 12/08/2019  Patient Care Team: Boykin Nearing, MD as PCP - General (Family Medicine)  CHIEF COMPLAINTS/PURPOSE OF CONSULTATION:  Anemia in pregnancy  HISTORY OF PRESENTING ILLNESS:   [name] [name] is a wonderful 34 y.o. female who has been referred to [place] by Dr. Alwyn Pea for evaluation and management of anemia. The pt reports that she is doing well overall.   The pt reports that she delivered her second child in February of this year. Pt was anemic towards the end of her last pregnancy. She contracted a mild COVID19 infection in April. Pt is currently [redacted] weeks pregnant and is expected to deliver at the end of January. Pt had normal menstrual periods between pregnancies. She has a history of anemia even outside of pregnancy.  She was started on PO Iron after delivery, but stopped due to significant constipation. She suffered from constipation for 2 months and tried several stools softeners and laxatives prior to starting Linzess, which resolved her constipation. Pt had issues with constipation prior to starting PO Iron. Pt has not been evaluated by a Gastroenterologist for this. She has been unable to take her prenatal vitamin as prescribed due to nausea and vomiting. She had a H. Pylori infection last June for which she received antibiotics, but she is unsure if this cleared the infection. She is taking Linzess regularly and Famotidine as needed.  Pt reports that a few times per week she becomes very dizzy, to the point that she feels nauseous and needs to lay down. Pt has to rest for 15 minutes for this to resolve. She denies any fainting during these episodes. Pt is currently taking Unisom over the counter to help with her sleeplessness. She notes that she has shortness of breath when sleeping at night, which is new during her current pregnancy. She feels weakness and shortness of breath when moving about the house during the day. Pt  has been eating well and gaining weight appropriately.  Most recent lab results (11/15/2019) of CBC is as follows: all values are WNL except for RBC at 3.35, Hgb at 8.2, HCT at 25.7, MCV at 77, MCH at 24.5, BUN at 4, Creatinine at 0.44, Calcium at 8.5.  On review of systems, pt reports dizziness, sleeplessness, rash, fatigue, SOB and denies syncope, fevers, chills, night sweats, low appetite, unexpected weight loss and any other symptoms.   On PMHx the pt reports Anemia, COVID19, H. Pylori infection, GERD. On Family Hx the pt reports no Thalassemia trait or other blood disorders.  MEDICAL HISTORY:  Past Medical History:  Diagnosis Date  . Anemia 2009  . Anxiety   . Back pain 2010  . COVID-19   . Exposure to x-rays 2012   normal  . H. pylori infection 2019  . H/O candidiasis   . H/O varicella   . History of gastroesophageal reflux (GERD) 2010   taking with tums, helps   . Hypotension   . Migraines 2009  . Sleep apnea 2012    SURGICAL HISTORY: Past Surgical History:  Procedure Laterality Date  . COLPOSCOPY  04/02/2011  . NO PAST SURGERIES      SOCIAL HISTORY: Social History   Socioeconomic History  . Marital status: Married    Spouse name: Not on file  . Number of children: Not on file  . Years of education: Not on file  . Highest education level: Not on file  Occupational History  . Occupation: unemployed   Tobacco  Use  . Smoking status: Never Smoker  . Smokeless tobacco: Never Used  Vaping Use  . Vaping Use: Never used  Substance and Sexual Activity  . Alcohol use: No  . Drug use: No  . Sexual activity: Not Currently    Birth control/protection: None  Other Topics Concern  . Not on file  Social History Narrative   Moved to [place] from [place] 7 years ago.   Married, husband age 46.   Muslim-no pork, needs female provider.    Mom is actively involved with patient. She has been here for 25 years.    Social Determinants of Health   Financial Resource  Strain:   . Difficulty of Paying Living Expenses: Not on file  Food Insecurity:   . Worried About Charity fundraiser in the Last Year: Not on file  . Ran Out of Food in the Last Year: Not on file  Transportation Needs:   . Lack of Transportation (Medical): Not on file  . Lack of Transportation (Non-Medical): Not on file  Physical Activity:   . Days of Exercise per Week: Not on file  . Minutes of Exercise per Session: Not on file  Stress:   . Feeling of Stress : Not on file  Social Connections:   . Frequency of Communication with Friends and Family: Not on file  . Frequency of Social Gatherings with Friends and Family: Not on file  . Attends Religious Services: Not on file  . Active Member of Clubs or Organizations: Not on file  . Attends Archivist Meetings: Not on file  . Marital Status: Not on file  Intimate Partner Violence:   . Fear of Current or Ex-Partner: Not on file  . Emotionally Abused: Not on file  . Physically Abused: Not on file  . Sexually Abused: Not on file    FAMILY HISTORY: Family History  Problem Relation Age of Onset  . Asthma Mother   . Cancer Mother 66       breast, cervix and bone and liver  . Breast cancer Mother 77  . Asthma Father   . Cancer Other   . Heart disease Other   . Stroke Other   . Thyroid disease Other   . Diabetes Other   . Hypertension Other   . Hyperlipidemia Other   . Hypertension Maternal Aunt   . Heart disease Maternal Grandmother   . Hypertension Maternal Grandmother   . Cancer Maternal Grandmother   . Breast cancer Maternal Grandmother   . Hypertension Maternal Grandfather   . Breast cancer Maternal Aunt   . Anesthesia problems Neg Hx     ALLERGIES:  is allergic to other.  MEDICATIONS:  Current Outpatient Medications  Medication Sig Dispense Refill  . famotidine (PEPCID) 20 MG tablet Take 1 tablet (20 mg total) by mouth 2 (two) times daily. 30 tablet 0  . fluticasone (FLONASE) 50 MCG/ACT nasal spray  Place 2 sprays into both nostrils daily. 16 g 2   No current facility-administered medications for this visit.    REVIEW OF SYSTEMS:    10 Point review of Systems was done is negative except as noted above.  PHYSICAL EXAMINATION: ECOG PERFORMANCE STATUS: 1 - Symptomatic but completely ambulatory  . Vitals:   12/08/19 1449  BP: 119/71  Pulse: 97  Resp: 18  Temp: (!) 97.5 F (36.4 C)  SpO2: 99%   Filed Weights   12/08/19 1449  Weight: 175 lb 4.8 oz (79.5 kg)   .Body  mass index is 31.05 kg/m.  GENERAL:alert, in no acute distress and comfortable SKIN: no acute rashes, no significant lesions EYES: conjunctiva are pink and non-injected, sclera anicteric OROPHARYNX: MMM, no exudates, no oropharyngeal erythema or ulceration NECK: supple, no JVD LYMPH:  no palpable lymphadenopathy in the cervical, axillary or inguinal regions LUNGS: clear to auscultation b/l with normal respiratory effort HEART: regular rate & rhythm ABDOMEN:  normoactive bowel sounds , non tender, not distended. Extremity: no pedal edema PSYCH: alert & oriented x 3 with fluent speech NEURO: no focal motor/sensory deficits  LABORATORY DATA:  I have reviewed the data as listed  . CBC Latest Ref Rng & Units 12/08/2019 02/14/2019 02/13/2019  WBC 4.0 - 10.5 K/uL 9.9 11.4(H) 10.1  Hemoglobin 12.0 - 15.0 g/dL 8.4(L) 7.6(L) 9.4(L)  Hematocrit 36 - 46 % 28.0(L) 24.6(L) 30.7(L)  Platelets 150 - 400 K/uL 250 199 255    . CMP Latest Ref Rng & Units 12/08/2019 10/04/2018 08/08/2018  Glucose 70 - 99 mg/dL 94 79 91  BUN 6 - 20 mg/dL 4(L) <5(L) <5(L)  Creatinine 0.44 - 1.00 mg/dL 0.59 0.37(L) 0.34(L)  Sodium 135 - 145 mmol/L 137 135 134(L)  Potassium 3.5 - 5.1 mmol/L 3.7 3.4(L) 3.8  Chloride 98 - 111 mmol/L 108 104 106  CO2 22 - 32 mmol/L 21(L) 21(L) 20(L)  Calcium 8.9 - 10.3 mg/dL 8.9 8.6(L) 8.6(L)  Total Protein 6.5 - 8.1 g/dL 8.0 6.9 6.6  Total Bilirubin 0.3 - 1.2 mg/dL 0.8 0.7 0.5  Alkaline Phos 38 - 126 U/L  128(H) 72 43  AST 15 - 41 U/L 12(L) 15 18  ALT 0 - 44 U/L 6 11 14    . Lab Results  Component Value Date   IRON 31 (L) 12/08/2019   TIBC 534 (H) 12/08/2019   IRONPCTSAT 6 (L) 12/08/2019   (Iron and TIBC)  Lab Results  Component Value Date   FERRITIN <4 (L) 12/08/2019   Component     Latest Ref Rng & Units 12/08/2019  Folate, Hemolysate     Not Estab. ng/mL 300.0  HCT     34.0 - 46.6 % 27.3 (L)  Folate, RBC     >498 ng/mL 1,099  Vitamin B12     180 - 914 pg/mL 93 (L)    RADIOGRAPHIC STUDIES: I have personally reviewed the radiological images as listed and agreed with the findings in the report. No results found.  ASSESSMENT & PLAN:   34 yo with   1) Severe Anemia in pregnancy 2) Severe Iron deficiency in pregnancy 3) Poor tolerance to po iron 4) B12 deficiency  PLAN: -Discussed patient's most recent labs from 11/15/2019, all values are WNL except for RBC at 3.35, Hgb at 8.2, HCT at 25.7, MCV at 77, MCH at 24.5, BUN at 4, Creatinine at 0.44, Calcium at 8.5. -Recommend IV Iron for rapid iron replacement, especially considering PO Iron intolerance. -Advised pt that we would give IV Iron at [facility] for maternal-fetal monitoring. -Advised pt that she may require a blood transfusion if she is significantly anemic - will know based on labs today. -Advised pt that we would give 4 doses of IV Venofer, once weekly - pt agrees. -[facility] B12 1000 mcg twice weekly x 4 doses -B12 SL 1000 mcg SL daily OTC -continue prenatal vitamins -Will get labs today -Will see back in 6 weeks with labs -if dizzy or lightheaded patient was advised to call her [facility] for consideration of PRBC transfusion.  FOLLOW UP: Labs today RTC with Dr Irene Limbo with labs in 6 weeks IV Venofer twice weekly x 4 doses at [facility] B12 1000 mcg twice weekly x 4 doses  . Orders Placed This Encounter  Procedures  . CBC with Differential/Platelet    Standing Status:   Future     Number of Occurrences:   1    Standing Expiration Date:   12/07/2020  . CMP ([place] only)    Standing Status:   Future    Number of Occurrences:   1    Standing Expiration Date:   12/07/2020  . Ferritin    Standing Status:   Future    Number of Occurrences:   1    Standing Expiration Date:   12/07/2020  . Iron and TIBC    Standing Status:   Future    Number of Occurrences:   1    Standing Expiration Date:   12/07/2020  . Vitamin B12    Standing Status:   Future    Number of Occurrences:   1    Standing Expiration Date:   12/07/2020  . Folate RBC    Standing Status:   Future    Number of Occurrences:   1    Standing Expiration Date:   12/07/2020  . ABO/RH    Standing Status:   Future    Number of Occurrences:   1    Standing Expiration Date:   12/07/2020  . Sample to Blood Bank    Standing Status:   Future    Number of Occurrences:   1    Standing Expiration Date:   12/07/2020    All of the patients questions were answered with apparent satisfaction. The patient knows to call the clinic with any problems, questions or concerns.  I spent 45 mins counseling the patient face to face. The total time spent in the appointment was 60 minutes and more than 50% was on counseling and direct patient cares.    Sullivan Lone MD [place] AAHIVMS [facility] Hematology/Oncology Physician [facility]  (Office):       [phone] (Work cell):  [phone] (Fax):           [phone]  12/08/2019 4:51 PM  I, Yevette Edwards, am acting as a scribe for Dr. Sullivan Lone.   .I have reviewed the above documentation for accuracy and completeness, and I agree with the above. Brunetta Genera MD

## 2019-12-08 NOTE — Patient Instructions (Signed)
Thank you for choosing [facility] to provide your oncology and hematology care.   Should you have questions after your visit to the [facility] ([facility]), please contact this office at [phone] between 8:30 AM and 4:30 PM.  Voice mails left after 4:00 PM may not be returned until the following business day.  Calls received after 4:30 PM will be answered by an off-site Nurse Triage Line.    Prescription Refills:  Please have your pharmacy contact us directly for most prescription requests.  Contact the office directly for refills of narcotics (pain medications). Allow 48-72 hours for refills.  Appointments: Please contact the [facility] scheduling department [phone] for questions regarding [facility] appointment scheduling.  Contact the schedulers with any scheduling changes so that your appointment can be rescheduled in a timely manner.   Central Scheduling for [place] [phone] - Call to schedule procedures such as PET scans, CT scans, MRI, Ultrasound, etc.  To afford each patient quality time with our providers, please arrive 30 minutes before your scheduled appointment time.  If you arrive late for your appointment, you may be asked to reschedule.  We strive to give you quality time with our providers, and arriving late affects you and other patients whose appointments are after yours. If you are a no show for multiple scheduled visits, you may be dismissed from the clinic at the providers discretion.     Resources: [facility] Social Workers [phone] for additional information on assistance programs or assistance connecting with community support programs   [place] DSS  [phone]: Information regarding food stamps, Medicaid, and utility assistance [place] Access [place] 336-333-6589   [place] [employer]'s shared-ride transportation service for eligible riders who have a disability that prevents them from riding the fixed route bus.   Medicare  Rights Center 800-333-4114 Helps people with Medicare understand their rights and benefits, navigate the Medicare system, and secure the quality healthcare they deserve [employer] 800-227-2345 Assists patients locate various types of support and financial assistance Cancer Care: 1-800-813-HOPE (4673) Provides financial assistance, online support groups, medication/co-pay assistance.   Transportation Assistance for appointments at [facility]: Transportation Coordinator [phone]  Again, thank you for choosing [facility] for your care.       

## 2019-12-09 LAB — IRON AND TIBC
Iron: 31 ug/dL — ABNORMAL LOW (ref 41–142)
Saturation Ratios: 6 % — ABNORMAL LOW (ref 21–57)
TIBC: 534 ug/dL — ABNORMAL HIGH (ref 236–444)
UIBC: 503 ug/dL — ABNORMAL HIGH (ref 120–384)

## 2019-12-09 LAB — FOLATE RBC
Folate, Hemolysate: 300 ng/mL
Folate, RBC: 1099 ng/mL (ref >498)
Hematocrit: 27.3 % — ABNORMAL LOW (ref 34.0–46.6)

## 2019-12-09 LAB — FERRITIN: Ferritin: <4 ng/mL — ABNORMAL LOW (ref 11–307)

## 2019-12-12 ENCOUNTER — Telehealth: Payer: Self-pay | Admitting: *Deleted

## 2019-12-12 MED ORDER — B-12 1000 MCG SL SUBL: 1000.0000 ug | SUBLINGUAL_TABLET | Freq: Every day | SUBLINGUAL | 3 refills | Status: DC

## 2019-12-12 NOTE — Telephone Encounter (Signed)
Patient called. Seen in office 12/2. She wants to know what her results were and what her next steps are. Dr. Irene Limbo informed. Dr. Grier Mitts response: She is severely iron deficient and as discussed, we want to set her up for Venofer twice weekly for 4 doses at [facility].  Orders are signed and held.  She is also severely B12 deficient--would recommend subcutaneous B12 shots twice weekly for 4 doses along with iron and also sublingual B12 which has been sent to her pharmacy.  Hemoglobin is 8.4 which is not absolutely in transfusion territory.  If she is quite symptomatic she should call her OB/GYN doctor to consider PRBC transfusion.  [place] to make first appt for IV iron and B12 injection - LVM [phone] with info and requested return call to Dr. Grier Mitts office to schedule patient. Contacted patient with all this information. Advised her she has new Rx to pick up and start and will be contacted with first appt. Also advised her if symptomatic (weak/dizzy/short of breath) to contact OB.

## 2019-12-13 ENCOUNTER — Other Ambulatory Visit: Payer: Self-pay | Admitting: *Deleted

## 2019-12-13 ENCOUNTER — Telehealth: Payer: Self-pay | Admitting: *Deleted

## 2019-12-13 NOTE — Telephone Encounter (Signed)
Notified of appt at [facility] Thursday at 0800. They will schedule next 3 appts while she is there.

## 2019-12-15 ENCOUNTER — Encounter (HOSPITAL_COMMUNITY)
Admission: RE | Admit: 2019-12-15 | Discharge: 2019-12-15 | Disposition: A | Discharge status: Home / Self Care | Payer: Medicaid Other | Source: Ambulatory Visit | Attending: Hematology | Admitting: Hematology

## 2019-12-15 ENCOUNTER — Other Ambulatory Visit: Payer: Self-pay

## 2019-12-15 DIAGNOSIS — O99013 Anemia complicating pregnancy, third trimester: Secondary | ICD-10-CM | POA: Diagnosis not present

## 2019-12-15 DIAGNOSIS — E538 Deficiency of other specified B group vitamins: Secondary | ICD-10-CM | POA: Diagnosis present

## 2019-12-15 MED ORDER — CYANOCOBALAMIN 1000 MCG/ML IJ SOLN: 1000.0000 ug | INTRAMUSCULAR | Status: DC

## 2019-12-15 MED ORDER — SODIUM CHLORIDE 0.9 % IV SOLN
200.0000 mg | INTRAVENOUS | Status: DC
  Administered 2019-12-15: 200 mg via INTRAVENOUS
  Filled 2019-12-15: qty 10

## 2019-12-15 MED ORDER — LORATADINE 10 MG PO TABS
10.0000 mg | ORAL_TABLET | Freq: Once | ORAL | Status: AC
  Administered 2019-12-15: 10 mg via ORAL

## 2019-12-15 MED ORDER — CYANOCOBALAMIN 1000 MCG/ML IJ SOLN
INTRAMUSCULAR | Status: AC
  Administered 2019-12-15: 1000 ug via SUBCUTANEOUS
  Filled 2019-12-15: qty 1

## 2019-12-15 MED ORDER — LORATADINE 10 MG PO TABS
ORAL_TABLET | ORAL | Status: AC
  Filled 2019-12-15: qty 1

## 2019-12-15 MED ORDER — ACETAMINOPHEN 325 MG PO TABS
ORAL_TABLET | ORAL | Status: AC
  Filled 2019-12-15: qty 2

## 2019-12-15 MED ORDER — ACETAMINOPHEN 325 MG PO TABS
650.0000 mg | ORAL_TABLET | Freq: Once | ORAL | Status: AC
  Administered 2019-12-15: 650 mg via ORAL

## 2019-12-15 NOTE — Discharge Instructions (Signed)
Iron Sucrose injection What is this medicine? IRON SUCROSE (AHY ern SOO krohs) is an iron complex. Iron is used to make healthy red blood cells, which carry oxygen and nutrients throughout the body. This medicine is used to treat iron deficiency anemia in people with chronic kidney disease. This medicine may be used for other purposes; ask your health care provider or pharmacist if you have questions. COMMON BRAND NAME(S): Venofer What should I tell my health care provider before I take this medicine? They need to know if you have any of these conditions:  anemia not caused by low iron levels  heart disease  high levels of iron in the blood  kidney disease  liver disease  an unusual or allergic reaction to iron, other medicines, foods, dyes, or preservatives  pregnant or trying to get pregnant  breast-feeding How should I use this medicine? This medicine is for infusion into a vein. It is given by a health care professional in a hospital or clinic setting. Talk to your pediatrician regarding the use of this medicine in children. While this drug may be prescribed for children as young as 2 years for selected conditions, precautions do apply. Overdosage: If you think you have taken too much of this medicine contact a poison control center or emergency room at once. NOTE: This medicine is only for you. Do not share this medicine with others. What if I miss a dose? It is important not to miss your dose. Call your doctor or health care professional if you are unable to keep an appointment. What may interact with this medicine? Do not take this medicine with any of the following medications:  deferoxamine  dimercaprol  other iron products This medicine may also interact with the following medications:  chloramphenicol  deferasirox This list may not describe all possible interactions. Give your health care provider a list of all the medicines, herbs, non-prescription drugs, or  dietary supplements you use. Also tell them if you smoke, drink alcohol, or use illegal drugs. Some items may interact with your medicine. What should I watch for while using this medicine? Visit your doctor or healthcare professional regularly. Tell your doctor or healthcare professional if your symptoms do not start to get better or if they get worse. You may need blood work done while you are taking this medicine. You may need to follow a special diet. Talk to your doctor. Foods that contain iron include: whole grains/cereals, dried fruits, beans, or peas, leafy green vegetables, and organ meats (liver, kidney). What side effects may I notice from receiving this medicine? Side effects that you should report to your doctor or health care professional as soon as possible:  allergic reactions like skin rash, itching or hives, swelling of the face, lips, or tongue  breathing problems  changes in blood pressure  cough  fast, irregular heartbeat  feeling faint or lightheaded, falls  fever or chills  flushing, sweating, or hot feelings  joint or muscle aches/pains  seizures  swelling of the ankles or feet  unusually weak or tired Side effects that usually do not require medical attention (report to your doctor or health care professional if they continue or are bothersome):  diarrhea  feeling achy  headache  irritation at site where injected  nausea, vomiting  stomach upset  tiredness This list may not describe all possible side effects. Call your doctor for medical advice about side effects. You may report side effects to FDA at 1-800-FDA-1088. Where should I keep   my medicine? This drug is given in a hospital or clinic and will not be stored at home. NOTE: This sheet is a summary. It may not cover all possible information. If you have questions about this medicine, talk to your doctor, pharmacist, or health care provider.  2020 Elsevier/Gold Standard (2010-10-03  17:14:35)   Cyanocobalamin, Vitamin B12 injection What is this medicine? CYANOCOBALAMIN (sye an oh koe BAL a min) is a man made form of vitamin B12. Vitamin B12 is used in the growth of healthy blood cells, nerve cells, and proteins in the body. It also helps with the metabolism of fats and carbohydrates. This medicine is used to treat people who can not absorb vitamin B12. This medicine may be used for other purposes; ask your health care provider or pharmacist if you have questions. COMMON BRAND NAME(S): B-12 Compliance Kit, B-12 Injection Kit, Cyomin, LA-12, Nutri-Twelve, Physicians EZ Use B-12, Primabalt What should I tell my health care provider before I take this medicine? They need to know if you have any of these conditions:  kidney disease  Leber's disease  megaloblastic anemia  an unusual or allergic reaction to cyanocobalamin, cobalt, other medicines, foods, dyes, or preservatives  pregnant or trying to get pregnant  breast-feeding How should I use this medicine? This medicine is injected into a muscle or deeply under the skin. It is usually given by a health care professional in a clinic or doctor's office. However, your doctor may teach you how to inject yourself. Follow all instructions. Talk to your pediatrician regarding the use of this medicine in children. Special care may be needed. Overdosage: If you think you have taken too much of this medicine contact a poison control center or emergency room at once. NOTE: This medicine is only for you. Do not share this medicine with others. What if I miss a dose? If you are given your dose at a clinic or doctor's office, call to reschedule your appointment. If you give your own injections and you miss a dose, take it as soon as you can. If it is almost time for your next dose, take only that dose. Do not take double or extra doses. What may interact with this medicine?  colchicine  heavy alcohol intake This list may not  describe all possible interactions. Give your health care provider a list of all the medicines, herbs, non-prescription drugs, or dietary supplements you use. Also tell them if you smoke, drink alcohol, or use illegal drugs. Some items may interact with your medicine. What should I watch for while using this medicine? Visit your doctor or health care professional regularly. You may need blood work done while you are taking this medicine. You may need to follow a special diet. Talk to your doctor. Limit your alcohol intake and avoid smoking to get the best benefit. What side effects may I notice from receiving this medicine? Side effects that you should report to your doctor or health care professional as soon as possible:  allergic reactions like skin rash, itching or hives, swelling of the face, lips, or tongue  blue tint to skin  chest tightness, pain  difficulty breathing, wheezing  dizziness  red, swollen painful area on the leg Side effects that usually do not require medical attention (report to your doctor or health care professional if they continue or are bothersome):  diarrhea  headache This list may not describe all possible side effects. Call your doctor for medical advice about side effects. You may report   side effects to FDA at 1-800-FDA-1088. Where should I keep my medicine? Keep out of the reach of children. Store at room temperature between 15 and 30 degrees C (59 and 85 degrees F). Protect from light. Throw away any unused medicine after the expiration date. NOTE: This sheet is a summary. It may not cover all possible information. If you have questions about this medicine, talk to your doctor, pharmacist, or health care provider.  2020 Elsevier/Gold Standard (2007-04-05 22:10:20)  

## 2019-12-19 ENCOUNTER — Other Ambulatory Visit: Payer: Self-pay

## 2019-12-19 ENCOUNTER — Encounter (HOSPITAL_COMMUNITY)
Admission: RE | Admit: 2019-12-19 | Discharge: 2019-12-19 | Disposition: A | Discharge status: Home / Self Care | Payer: Medicaid Other | Source: Ambulatory Visit | Attending: Hematology | Admitting: Hematology

## 2019-12-19 DIAGNOSIS — O99013 Anemia complicating pregnancy, third trimester: Secondary | ICD-10-CM | POA: Diagnosis not present

## 2019-12-19 DIAGNOSIS — E538 Deficiency of other specified B group vitamins: Secondary | ICD-10-CM

## 2019-12-19 MED ORDER — CYANOCOBALAMIN 1000 MCG/ML IJ SOLN
INTRAMUSCULAR | Status: AC
  Filled 2019-12-19: qty 1

## 2019-12-19 MED ORDER — LORATADINE 10 MG PO TABS
ORAL_TABLET | ORAL | Status: AC
  Filled 2019-12-19: qty 1

## 2019-12-19 MED ORDER — SODIUM CHLORIDE 0.9 % IV SOLN
200.0000 mg | INTRAVENOUS | Status: DC
  Administered 2019-12-19: 11:00:00 200 mg via INTRAVENOUS
  Filled 2019-12-19: qty 10

## 2019-12-19 MED ORDER — LORATADINE 10 MG PO TABS
10.0000 mg | ORAL_TABLET | Freq: Once | ORAL | Status: DC
  Administered 2019-12-19: 11:00:00 10 mg via ORAL

## 2019-12-19 MED ORDER — ACETAMINOPHEN 325 MG PO TABS
650.0000 mg | ORAL_TABLET | Freq: Once | ORAL | Status: DC
  Administered 2019-12-19: 11:00:00 650 mg via ORAL

## 2019-12-19 MED ORDER — ACETAMINOPHEN 325 MG PO TABS
ORAL_TABLET | ORAL | Status: AC
  Filled 2019-12-19: qty 2

## 2019-12-19 MED ORDER — CYANOCOBALAMIN 1000 MCG/ML IJ SOLN
1000.0000 ug | INTRAMUSCULAR | Status: DC
  Administered 2019-12-19: 11:00:00 1000 ug via SUBCUTANEOUS

## 2019-12-22 ENCOUNTER — Encounter (HOSPITAL_COMMUNITY)
Admission: RE | Admit: 2019-12-22 | Discharge: 2019-12-22 | Disposition: A | Discharge status: Home / Self Care | Payer: Medicaid Other | Source: Ambulatory Visit | Attending: Hematology | Admitting: Hematology

## 2019-12-22 ENCOUNTER — Other Ambulatory Visit: Payer: Self-pay

## 2019-12-22 ENCOUNTER — Ambulatory Visit (HOSPITAL_COMMUNITY): Payer: Medicaid Other

## 2019-12-22 DIAGNOSIS — O99013 Anemia complicating pregnancy, third trimester: Secondary | ICD-10-CM

## 2019-12-22 DIAGNOSIS — E538 Deficiency of other specified B group vitamins: Secondary | ICD-10-CM

## 2019-12-22 MED ORDER — LORATADINE 10 MG PO TABS
ORAL_TABLET | ORAL | Status: AC
  Administered 2019-12-22: 13:00:00 10 mg
  Filled 2019-12-22: qty 1

## 2019-12-22 MED ORDER — CYANOCOBALAMIN 1000 MCG/ML IJ SOLN
INTRAMUSCULAR | Status: AC
  Administered 2019-12-22: 13:00:00 1000 ug via INTRAMUSCULAR
  Filled 2019-12-22: qty 1

## 2019-12-22 MED ORDER — CYANOCOBALAMIN 1000 MCG/ML IJ SOLN: 1000.0000 ug | INTRAMUSCULAR | Status: DC

## 2019-12-22 MED ORDER — ACETAMINOPHEN 325 MG PO TABS
ORAL_TABLET | ORAL | Status: AC
  Administered 2019-12-22: 13:00:00 650 mg
  Filled 2019-12-22: qty 2

## 2019-12-22 MED ORDER — LORATADINE 10 MG PO TABS: 10.0000 mg | ORAL_TABLET | Freq: Once | ORAL | Status: DC

## 2019-12-22 MED ORDER — ACETAMINOPHEN 325 MG PO TABS: 650.0000 mg | ORAL_TABLET | Freq: Once | ORAL | Status: DC

## 2019-12-22 MED ORDER — SODIUM CHLORIDE 0.9 % IV SOLN
200.0000 mg | INTRAVENOUS | Status: DC
  Administered 2019-12-22: 13:00:00 200 mg via INTRAVENOUS
  Filled 2019-12-22: qty 10

## 2019-12-26 ENCOUNTER — Encounter (HOSPITAL_COMMUNITY)
Admission: RE | Admit: 2019-12-26 | Discharge: 2019-12-26 | Disposition: A | Discharge status: Home / Self Care | Payer: Medicaid Other | Source: Ambulatory Visit | Attending: Hematology | Admitting: Hematology

## 2019-12-26 ENCOUNTER — Ambulatory Visit (HOSPITAL_COMMUNITY): Payer: Medicaid Other

## 2019-12-26 ENCOUNTER — Other Ambulatory Visit: Payer: Self-pay

## 2019-12-26 DIAGNOSIS — O99013 Anemia complicating pregnancy, third trimester: Secondary | ICD-10-CM | POA: Diagnosis not present

## 2019-12-26 MED ORDER — LORATADINE 10 MG PO TABS
ORAL_TABLET | ORAL | Status: AC
  Administered 2019-12-26: 12:00:00 10 mg via ORAL
  Filled 2019-12-26: qty 1

## 2019-12-26 MED ORDER — ACETAMINOPHEN 325 MG PO TABS: 650.0000 mg | ORAL_TABLET | Freq: Once | ORAL | Status: AC

## 2019-12-26 MED ORDER — SODIUM CHLORIDE 0.9 % IV SOLN
200.0000 mg | INTRAVENOUS | Status: DC
  Administered 2019-12-26: 13:00:00 200 mg via INTRAVENOUS
  Filled 2019-12-26: qty 10

## 2019-12-26 MED ORDER — CYANOCOBALAMIN 1000 MCG/ML IJ SOLN: 1000.0000 ug | INTRAMUSCULAR | Status: DC

## 2019-12-26 MED ORDER — ACETAMINOPHEN 325 MG PO TABS
ORAL_TABLET | ORAL | Status: AC
  Administered 2019-12-26: 12:00:00 650 mg via ORAL
  Filled 2019-12-26: qty 2

## 2019-12-26 MED ORDER — CYANOCOBALAMIN 1000 MCG/ML IJ SOLN
INTRAMUSCULAR | Status: AC
  Administered 2019-12-26: 12:00:00 1000 ug via SUBCUTANEOUS
  Filled 2019-12-26: qty 1

## 2019-12-26 MED ORDER — LORATADINE 10 MG PO TABS: 10.0000 mg | ORAL_TABLET | Freq: Once | ORAL | Status: AC

## 2020-01-07 NOTE — L&D Delivery Note (Signed)
Delivery Note   Patient Name: [name] [name] DOB: 09-Jul-1985 MRN: [id number]  Date of admission: 02/04/2020 Delivering MD: Noralyn Pick  Date of delivery: 02/05/20 Type of delivery: SVD  Newborn Data: Live born female  Birth Weight:   APGAR: 9, 31  Newborn Delivery   Birth date/time: 02/05/2020 10:29:00 Delivery type: Vaginal, Spontaneous     [facility], [place] y.o., @ [redacted]w[redacted]d,  [phone], who was admitted for 1/29 for SROM in latent labor, progressed with pitocin of 8, after 8 doses fentanyl, and gbs + tx with pcn x3 times and currently comfortable iwht epidural . I was called to the room when she progressed 2+ station in the second stage of labor.  She pushed for 5/min.  She delivered a viable infant, cephalic and restituted to the ROA position over an intact perineum.  A nuchal cord   was identified, very loose almost around shoulders and somersaulted through. The baby was placed on maternal abdomen while initial step of NRP were perfmored (Dry, Stimulated, and warmed). Hat placed on baby for thermoregulation. Delayed cord clamping was performed for 3 minutes.  Cord double clamped and cut.  Cord cut by father. Apgar scores were 9 and 9. Prophylactic Pitocin was started in the third stage of labor for active management. The placenta delivered spontaneously, shultz, with a 3 vessel cord and was sent to LD.  Inspection revealed none. An examination of the vaginal vault and cervix was free from lacerations. The uterus was firm, bleeding stable.   Placenta and umbilical artery blood gas were not sent.  There were no complications during the procedure.  Mom and baby skin to skin following delivery. Left in stable condition. Pt h/o depression and will continue zoloft 50mg  PO , mood stable.   Maternal Info: Anesthesia: Epidural Episiotomy: no Lacerations:  no Suture Repair: no Est. Blood Loss (mL):  84mls  Newborn Info:  Baby Sex: female Circumcision: N/A APGAR (1 MIN): 9   APGAR (5  MINS): 9   APGAR (10 MINS):     Mom to postpartum.  Baby to Couplet care / Skin to Skin.  DR Alwyn Pea aware.   [place], [place], NP-C 02/05/20 10:46 AM

## 2020-01-11 DIAGNOSIS — Z3A36 36 weeks gestation of pregnancy: Secondary | ICD-10-CM | POA: Diagnosis not present

## 2020-01-11 DIAGNOSIS — O26849 Uterine size-date discrepancy, unspecified trimester: Secondary | ICD-10-CM | POA: Diagnosis not present

## 2020-01-13 DIAGNOSIS — L299 Pruritus, unspecified: Secondary | ICD-10-CM | POA: Diagnosis not present

## 2020-01-13 DIAGNOSIS — Z3403 Encounter for supervision of normal first pregnancy, third trimester: Secondary | ICD-10-CM | POA: Diagnosis not present

## 2020-01-13 DIAGNOSIS — Z331 Pregnant state, incidental: Secondary | ICD-10-CM | POA: Diagnosis not present

## 2020-01-13 LAB — OB RESULTS CONSOLE GBS: GBS: POSITIVE

## 2020-01-13 LAB — OB RESULTS CONSOLE GC/CHLAMYDIA
Chlamydia: NEGATIVE
Gonorrhea: NEGATIVE

## 2020-01-17 ENCOUNTER — Other Ambulatory Visit: Payer: Self-pay | Admitting: *Deleted

## 2020-01-17 DIAGNOSIS — O99013 Anemia complicating pregnancy, third trimester: Secondary | ICD-10-CM

## 2020-01-17 DIAGNOSIS — E538 Deficiency of other specified B group vitamins: Secondary | ICD-10-CM

## 2020-01-18 ENCOUNTER — Inpatient Hospital Stay: Payer: Medicaid Other | Attending: Hematology

## 2020-01-18 ENCOUNTER — Inpatient Hospital Stay (HOSPITAL_BASED_OUTPATIENT_CLINIC_OR_DEPARTMENT_OTHER): Payer: Medicaid Other | Admitting: Hematology

## 2020-01-18 ENCOUNTER — Other Ambulatory Visit: Payer: Self-pay

## 2020-01-18 VITALS — BP 129/89 | HR 87 | Temp 97.9°F | Resp 15 | Wt 179.1 lb

## 2020-01-18 DIAGNOSIS — R42 Dizziness and giddiness: Secondary | ICD-10-CM | POA: Insufficient documentation

## 2020-01-18 DIAGNOSIS — D509 Iron deficiency anemia, unspecified: Secondary | ICD-10-CM | POA: Diagnosis not present

## 2020-01-18 DIAGNOSIS — Z331 Pregnant state, incidental: Secondary | ICD-10-CM | POA: Diagnosis not present

## 2020-01-18 DIAGNOSIS — R634 Abnormal weight loss: Secondary | ICD-10-CM | POA: Insufficient documentation

## 2020-01-18 DIAGNOSIS — E538 Deficiency of other specified B group vitamins: Secondary | ICD-10-CM

## 2020-01-18 DIAGNOSIS — R21 Rash and other nonspecific skin eruption: Secondary | ICD-10-CM | POA: Insufficient documentation

## 2020-01-18 DIAGNOSIS — Z8616 Personal history of COVID-19: Secondary | ICD-10-CM | POA: Insufficient documentation

## 2020-01-18 DIAGNOSIS — Z803 Family history of malignant neoplasm of breast: Secondary | ICD-10-CM | POA: Diagnosis not present

## 2020-01-18 DIAGNOSIS — G4700 Insomnia, unspecified: Secondary | ICD-10-CM | POA: Diagnosis not present

## 2020-01-18 DIAGNOSIS — D500 Iron deficiency anemia secondary to blood loss (chronic): Secondary | ICD-10-CM | POA: Diagnosis not present

## 2020-01-18 DIAGNOSIS — K5900 Constipation, unspecified: Secondary | ICD-10-CM | POA: Insufficient documentation

## 2020-01-18 DIAGNOSIS — G4730 Sleep apnea, unspecified: Secondary | ICD-10-CM | POA: Diagnosis not present

## 2020-01-18 DIAGNOSIS — K219 Gastro-esophageal reflux disease without esophagitis: Secondary | ICD-10-CM | POA: Diagnosis not present

## 2020-01-18 DIAGNOSIS — R5383 Other fatigue: Secondary | ICD-10-CM | POA: Diagnosis not present

## 2020-01-18 DIAGNOSIS — Z79899 Other long term (current) drug therapy: Secondary | ICD-10-CM | POA: Diagnosis not present

## 2020-01-18 DIAGNOSIS — R0602 Shortness of breath: Secondary | ICD-10-CM | POA: Diagnosis not present

## 2020-01-18 DIAGNOSIS — O99013 Anemia complicating pregnancy, third trimester: Secondary | ICD-10-CM | POA: Diagnosis not present

## 2020-01-18 LAB — CMP (CANCER CENTER ONLY)
ALT: <6 U/L (ref 0–44)
AST: 8 U/L — ABNORMAL LOW (ref 15–41)
Albumin: 3 g/dL — ABNORMAL LOW (ref 3.5–5.0)
Alkaline Phosphatase: 128 U/L — ABNORMAL HIGH (ref 38–126)
Anion gap: 6 (ref 5–15)
BUN: 5 mg/dL — ABNORMAL LOW (ref 6–20)
CO2: 22 mmol/L (ref 22–32)
Calcium: 8.6 mg/dL — ABNORMAL LOW (ref 8.9–10.3)
Chloride: 109 mmol/L (ref 98–111)
Creatinine: 0.58 mg/dL (ref 0.44–1.00)
GFR, Estimated: >60 mL/min (ref >60)
Glucose, Bld: 89 mg/dL (ref 70–99)
Potassium: 3.5 mmol/L (ref 3.5–5.1)
Sodium: 137 mmol/L (ref 135–145)
Total Bilirubin: 0.9 mg/dL (ref 0.3–1.2)
Total Protein: 6.9 g/dL (ref 6.5–8.1)

## 2020-01-18 LAB — IRON AND TIBC
Iron: 61 ug/dL (ref 41–142)
Saturation Ratios: 15 % — ABNORMAL LOW (ref 21–57)
TIBC: 404 ug/dL (ref 236–444)
UIBC: 342 ug/dL (ref 120–384)

## 2020-01-18 LAB — CBC WITH DIFFERENTIAL (CANCER CENTER ONLY)
Abs Immature Granulocytes: 0.03 10*3/uL (ref 0.00–0.07)
Basophils Absolute: 0 10*3/uL (ref 0.0–0.1)
Basophils Relative: 0 %
Eosinophils Absolute: 0 10*3/uL (ref 0.0–0.5)
Eosinophils Relative: 1 %
HCT: 30.8 % — ABNORMAL LOW (ref 36.0–46.0)
Hemoglobin: 9.6 g/dL — ABNORMAL LOW (ref 12.0–15.0)
Immature Granulocytes: 0 %
Lymphocytes Relative: 20 %
Lymphs Abs: 1.6 10*3/uL (ref 0.7–4.0)
MCH: 26.2 pg (ref 26.0–34.0)
MCHC: 31.2 g/dL (ref 30.0–36.0)
MCV: 83.9 fL (ref 80.0–100.0)
Monocytes Absolute: 0.4 10*3/uL (ref 0.1–1.0)
Monocytes Relative: 5 %
Neutro Abs: 5.8 10*3/uL (ref 1.7–7.7)
Neutrophils Relative %: 74 %
Platelet Count: 176 10*3/uL (ref 150–400)
RBC: 3.67 MIL/uL — ABNORMAL LOW (ref 3.87–5.11)
RDW: 21.5 % — ABNORMAL HIGH (ref 11.5–15.5)
WBC Count: 7.9 10*3/uL (ref 4.0–10.5)
nRBC: 0 % (ref 0.0–0.2)

## 2020-01-18 LAB — VITAMIN B12: Vitamin B-12: 133 pg/mL — ABNORMAL LOW (ref 180–914)

## 2020-01-18 LAB — FERRITIN: Ferritin: 31 ng/mL (ref 11–307)

## 2020-01-18 NOTE — Progress Notes (Signed)
HEMATOLOGY/ONCOLOGY CONSULTATION NOTE  Date of Service: 01/18/2020  Patient Care Team: Pcp, No as PCP - General  CHIEF COMPLAINTS/PURPOSE OF CONSULTATION:  Anemia in pregnancy  HISTORY OF PRESENTING ILLNESS:   [name] [name] is a wonderful 35 y.o. female who has been referred to [place] by Dr. Alwyn Pea for evaluation and management of anemia. The pt reports that she is doing well overall.   The pt reports that she delivered her second child in February of this year. Pt was anemic towards the end of her last pregnancy. She contracted a mild COVID19 infection in April. Pt is currently [redacted] weeks pregnant and is expected to deliver at the end of January. Pt had normal menstrual periods between pregnancies. She has a history of anemia even outside of pregnancy.  She was started on PO Iron after delivery, but stopped due to significant constipation. She suffered from constipation for 2 months and tried several stools softeners and laxatives prior to starting Linzess, which resolved her constipation. Pt had issues with constipation prior to starting PO Iron. Pt has not been evaluated by a Gastroenterologist for this. She has been unable to take her prenatal vitamin as prescribed due to nausea and vomiting. She had a H. Pylori infection last June for which she received antibiotics, but she is unsure if this cleared the infection. She is taking Linzess regularly and Famotidine as needed.  Pt reports that a few times per week she becomes very dizzy, to the point that she feels nauseous and needs to lay down. Pt has to rest for 15 minutes for this to resolve. She denies any fainting during these episodes. Pt is currently taking Unisom over the counter to help with her sleeplessness. She notes that she has shortness of breath when sleeping at night, which is new during her current pregnancy. She feels weakness and shortness of breath when moving about the house during the day. Pt has been eating well and gaining  weight appropriately.  Most recent lab results (11/15/2019) of CBC is as follows: all values are WNL except for RBC at 3.35, Hgb at 8.2, HCT at 25.7, MCV at 77, MCH at 24.5, BUN at 4, Creatinine at 0.44, Calcium at 8.5.  On review of systems, pt reports dizziness, sleeplessness, rash, fatigue, SOB and denies syncope, fevers, chills, night sweats, low appetite, unexpected weight loss and any other symptoms.   On PMHx the pt reports Anemia, COVID19, H. Pylori infection, GERD. On Family Hx the pt reports no Thalassemia trait or other blood disorders.  INTERVAL HISTORY:  [name] [name] is a wonderful 36 y.o. female who is here for evaluation and management of anemia. The patient's last visit with [place] was on 12/08/2019. The pt reports that she is doing well overall.  The pt reports she is feeling much better after the IV iron infusions and with the B12 replacement. No further dizziness or lightheadedness. She is now at term.  Lab results today (01/18/20) of CBC w/diff and CMP -- shows improvement in hgb from 8.4 to 9.6. 01/18/2020 Ferritin at 31 -- up from <4 01/18/2020 Vitamin B12 at 133 improved from 48  MEDICAL HISTORY:  Past Medical History:  Diagnosis Date  . Anemia 2009  . Anxiety   . Back pain 2010  . COVID-19   . Exposure to x-rays 2012   normal  . H. pylori infection 2019  . H/O candidiasis   . H/O varicella   . History of gastroesophageal reflux (GERD) 2010  taking with tums, helps   . Hypotension   . Migraines 2009  . Sleep apnea 2012    SURGICAL HISTORY: Past Surgical History:  Procedure Laterality Date  . COLPOSCOPY  04/02/2011  . NO PAST SURGERIES      SOCIAL HISTORY: Social History   Socioeconomic History  . Marital status: Married    Spouse name: Not on file  . Number of children: Not on file  . Years of education: Not on file  . Highest education level: Not on file  Occupational History  . Occupation: unemployed   Tobacco Use  . Smoking status:  Never Smoker  . Smokeless tobacco: Never Used  Vaping Use  . Vaping Use: Never used  Substance and Sexual Activity  . Alcohol use: No  . Drug use: No  . Sexual activity: Not Currently    Birth control/protection: None  Other Topics Concern  . Not on file  Social History Narrative   Moved to [place] from [place] 7 years ago.   Married, husband age 59.   Muslim-no pork, needs female provider.    Mom is actively involved with patient. She has been here for 25 years.    Social Determinants of Health   Financial Resource Strain: Not on file  Food Insecurity: Not on file  Transportation Needs: Not on file  Physical Activity: Not on file  Stress: Not on file  Social Connections: Not on file  Intimate Partner Violence: Not on file    FAMILY HISTORY: Family History  Problem Relation Age of Onset  . Asthma Mother   . Cancer Mother 48       breast, cervix and bone and liver  . Breast cancer Mother 45  . Asthma Father   . Cancer Other   . Heart disease Other   . Stroke Other   . Thyroid disease Other   . Diabetes Other   . Hypertension Other   . Hyperlipidemia Other   . Hypertension Maternal Aunt   . Heart disease Maternal Grandmother   . Hypertension Maternal Grandmother   . Cancer Maternal Grandmother   . Breast cancer Maternal Grandmother   . Hypertension Maternal Grandfather   . Breast cancer Maternal Aunt   . Anesthesia problems Neg Hx     ALLERGIES:  is allergic to other.  MEDICATIONS:  Current Outpatient Medications  Medication Sig Dispense Refill  . Cyanocobalamin (B-12) 1000 MCG SUBL Place 1,000 mcg under the tongue daily. 30 tablet 3  . famotidine (PEPCID) 20 MG tablet Take 1 tablet (20 mg total) by mouth 2 (two) times daily. 30 tablet 0  . fluticasone (FLONASE) 50 MCG/ACT nasal spray Place 2 sprays into both nostrils daily. 16 g 2   No current facility-administered medications for this visit.    REVIEW OF SYSTEMS:   A 10+ POINT REVIEW OF SYSTEMS WAS  OBTAINED including neurology, dermatology, psychiatry, cardiac, respiratory, lymph, extremities, GI, GU, Musculoskeletal, constitutional, breasts, reproductive, HEENT.  All pertinent positives are noted in the HPI.  All others are negative.   PHYSICAL EXAMINATION: ECOG PERFORMANCE STATUS: 1 - Symptomatic but completely ambulatory  . There were no vitals filed for this visit. There were no vitals filed for this visit. .There is no height or weight on file to calculate BMI.  NAD GENERAL:alert, in no acute distress and comfortable SKIN: no acute rashes, no significant lesions EYES: conjunctiva are pink and non-injected, sclera anicteric OROPHARYNX: MMM, no exudates, no oropharyngeal erythema or ulceration NECK: supple, no JVD LYMPH:  no palpable lymphadenopathy in the cervical, axillary or inguinal regions LUNGS: clear to auscultation b/l with normal respiratory effort HEART: regular rate & rhythm ABDOMEN:  normoactive bowel sounds , non tender, not distended. No palpable hepatosplenomegaly.  Extremity: no pedal edema PSYCH: alert & oriented x 3 with fluent speech NEURO: no focal motor/sensory deficits  LABORATORY DATA:  I have reviewed the data as listed  . CBC Latest Ref Rng & Units 12/08/2019 12/08/2019 02/14/2019  WBC 4.0 - 10.5 K/uL 9.9 - 11.4(H)  Hemoglobin 12.0 - 15.0 g/dL 8.4(L) - 7.6(L)  Hematocrit 34.0 - 46.6 % 28.0(L) 27.3(L) 24.6(L)  Platelets 150 - 400 K/uL 250 - 199    . CMP Latest Ref Rng & Units 12/08/2019 10/04/2018 08/08/2018  Glucose 70 - 99 mg/dL 94 79 91  BUN 6 - 20 mg/dL 4(L) <5(L) <5(L)  Creatinine 0.44 - 1.00 mg/dL 0.59 0.37(L) 0.34(L)  Sodium 135 - 145 mmol/L 137 135 134(L)  Potassium 3.5 - 5.1 mmol/L 3.7 3.4(L) 3.8  Chloride 98 - 111 mmol/L 108 104 106  CO2 22 - 32 mmol/L 21(L) 21(L) 20(L)  Calcium 8.9 - 10.3 mg/dL 8.9 8.6(L) 8.6(L)  Total Protein 6.5 - 8.1 g/dL 8.0 6.9 6.6  Total Bilirubin 0.3 - 1.2 mg/dL 0.8 0.7 0.5  Alkaline Phos 38 - 126 U/L 128(H) 72  43  AST 15 - 41 U/L 12(L) 15 18  ALT 0 - 44 U/L 6 11 14    . Lab Results  Component Value Date   IRON 31 (L) 12/08/2019   TIBC 534 (H) 12/08/2019   IRONPCTSAT 6 (L) 12/08/2019   (Iron and TIBC)  Lab Results  Component Value Date   FERRITIN <4 (L) 12/08/2019   Component     Latest Ref Rng & Units 12/08/2019  Folate, Hemolysate     Not Estab. ng/mL 300.0  HCT     34.0 - 46.6 % 27.3 (L)  Folate, RBC     >498 ng/mL 1,099  Vitamin B12     180 - 914 pg/mL 93 (L)    RADIOGRAPHIC STUDIES: I have personally reviewed the radiological images as listed and agreed with the findings in the report. No results found.  ASSESSMENT & PLAN:   35 yo with   1) Severe Anemia in pregnancy 2) Severe Iron deficiency in pregnancy 3) Poor tolerance to po iron 4) B12 deficiency  PLAN: -discussed labs from today. Improvement in hgb from 8.4 to 9.6 with resolution of dizziness/lightheadedness 01/18/2020 Ferritin at 31 -- up from <4 01/18/2020 Vitamin B12 at 133 improved from 93 -B12 SL 1000 mcg SL daily OTC -continue prenatal vitamins    FOLLOW UP: RTC with Dr Irene Limbo with labs in 2 months  .  The total time spent in the appt was 15 minutes and more than 50% was on counseling and direct patient cares.  All of the patient's questions were answered with apparent satisfaction. The patient knows to call the clinic with any problems, questions or concerns.    Sullivan Lone MD [place] AAHIVMS [facility] Hematology/Oncology [facility]  (Office):       [phone] (Work cell):  [phone] (Fax):           [phone]  01/18/2020 4:42 AM  I, Yevette Edwards, am acting as a scribe for Dr. Sullivan Lone.   .I have reviewed the above documentation for accuracy and completeness, and I agree with the above. Brunetta Genera MD

## 2020-01-19 LAB — FOLATE RBC
Folate, Hemolysate: 504 ng/mL
Folate, RBC: 1642 ng/mL (ref >498)
Hematocrit: 30.7 % — ABNORMAL LOW (ref 34.0–46.6)

## 2020-02-03 ENCOUNTER — Other Ambulatory Visit: Payer: Self-pay | Admitting: Obstetrics and Gynecology

## 2020-02-04 ENCOUNTER — Other Ambulatory Visit: Payer: Self-pay

## 2020-02-04 ENCOUNTER — Encounter (HOSPITAL_COMMUNITY): Payer: Self-pay | Admitting: Obstetrics & Gynecology

## 2020-02-04 ENCOUNTER — Inpatient Hospital Stay (HOSPITAL_COMMUNITY)
Admission: AD | Admit: 2020-02-04 | Discharge: 2020-02-06 | DRG: 807 | Disposition: A | Discharge status: Home / Self Care | Payer: Medicaid Other | Attending: Obstetrics & Gynecology | Admitting: Obstetrics & Gynecology

## 2020-02-04 DIAGNOSIS — O26893 Other specified pregnancy related conditions, third trimester: Secondary | ICD-10-CM | POA: Diagnosis not present

## 2020-02-04 DIAGNOSIS — F32A Depression, unspecified: Secondary | ICD-10-CM | POA: Diagnosis not present

## 2020-02-04 DIAGNOSIS — D509 Iron deficiency anemia, unspecified: Secondary | ICD-10-CM | POA: Diagnosis not present

## 2020-02-04 DIAGNOSIS — B951 Streptococcus, group B, as the cause of diseases classified elsewhere: Secondary | ICD-10-CM | POA: Diagnosis present

## 2020-02-04 DIAGNOSIS — O09529 Supervision of elderly multigravida, unspecified trimester: Secondary | ICD-10-CM

## 2020-02-04 DIAGNOSIS — Z3A40 40 weeks gestation of pregnancy: Secondary | ICD-10-CM | POA: Diagnosis not present

## 2020-02-04 DIAGNOSIS — Z6791 Unspecified blood type, Rh negative: Secondary | ICD-10-CM

## 2020-02-04 DIAGNOSIS — O9902 Anemia complicating childbirth: Secondary | ICD-10-CM | POA: Diagnosis not present

## 2020-02-04 DIAGNOSIS — O99344 Other mental disorders complicating childbirth: Secondary | ICD-10-CM | POA: Diagnosis not present

## 2020-02-04 DIAGNOSIS — Z20822 Contact with and (suspected) exposure to covid-19: Secondary | ICD-10-CM | POA: Diagnosis not present

## 2020-02-04 DIAGNOSIS — O99824 Streptococcus B carrier state complicating childbirth: Secondary | ICD-10-CM | POA: Diagnosis not present

## 2020-02-04 DIAGNOSIS — O26899 Other specified pregnancy related conditions, unspecified trimester: Secondary | ICD-10-CM

## 2020-02-04 DIAGNOSIS — O2686 Pruritic urticarial papules and plaques of pregnancy (PUPPP): Secondary | ICD-10-CM | POA: Diagnosis not present

## 2020-02-04 LAB — CBC
HCT: 36 % (ref 36.0–46.0)
Hemoglobin: 11.3 g/dL — ABNORMAL LOW (ref 12.0–15.0)
MCH: 26.5 pg (ref 26.0–34.0)
MCHC: 31.4 g/dL (ref 30.0–36.0)
MCV: 84.3 fL (ref 80.0–100.0)
Platelets: 209 10*3/uL (ref 150–400)
RBC: 4.27 MIL/uL (ref 3.87–5.11)
RDW: 19.5 % — ABNORMAL HIGH (ref 11.5–15.5)
WBC: 10.2 10*3/uL (ref 4.0–10.5)
nRBC: 0 % (ref 0.0–0.2)

## 2020-02-04 LAB — SARS CORONAVIRUS 2 BY RT PCR (HOSPITAL ORDER, PERFORMED IN ~~LOC~~ HOSPITAL LAB): SARS Coronavirus 2: NEGATIVE

## 2020-02-04 LAB — TYPE AND SCREEN
ABO/RH(D): A NEG
Antibody Screen: NEGATIVE

## 2020-02-04 MED ORDER — LIDOCAINE HCL (PF) 1 % IJ SOLN: 30.0000 mL | INTRAMUSCULAR | Status: DC | PRN

## 2020-02-04 MED ORDER — LACTATED RINGERS IV SOLN: 500.0000 mL | Freq: Once | INTRAVENOUS | Status: DC

## 2020-02-04 MED ORDER — FLEET ENEMA 7-19 GM/118ML RE ENEM: 1.0000 | ENEMA | Freq: Every day | RECTAL | Status: DC | PRN

## 2020-02-04 MED ORDER — OXYTOCIN-SODIUM CHLORIDE 30-0.9 UT/500ML-% IV SOLN: 2.5000 [IU]/h | INTRAVENOUS | Status: DC

## 2020-02-04 MED ORDER — FENTANYL CITRATE (PF) 100 MCG/2ML IJ SOLN
50.0000 ug | INTRAMUSCULAR | Status: DC | PRN
  Administered 2020-02-04: 50 ug via INTRAVENOUS
  Administered 2020-02-05 (×6): 100 ug via INTRAVENOUS
  Filled 2020-02-04 (×8): qty 2

## 2020-02-04 MED ORDER — PENICILLIN G POT IN DEXTROSE 60000 UNIT/ML IV SOLN
3.0000 10*6.[IU] | INTRAVENOUS | Status: DC
  Administered 2020-02-05 (×2): 3 10*6.[IU] via INTRAVENOUS
  Filled 2020-02-04 (×4): qty 50

## 2020-02-04 MED ORDER — PHENYLEPHRINE 40 MCG/ML (10ML) SYRINGE FOR IV PUSH (FOR BLOOD PRESSURE SUPPORT): 80.0000 ug | PREFILLED_SYRINGE | INTRAVENOUS | Status: DC | PRN

## 2020-02-04 MED ORDER — EPHEDRINE 5 MG/ML INJ: 10.0000 mg | INTRAVENOUS | Status: DC | PRN

## 2020-02-04 MED ORDER — FENTANYL-BUPIVACAINE-NACL 0.5-0.125-0.9 MG/250ML-% EP SOLN
12.0000 mL/h | EPIDURAL | Status: DC | PRN
  Administered 2020-02-05: 12 mL/h via EPIDURAL
  Filled 2020-02-04: qty 250

## 2020-02-04 MED ORDER — LACTATED RINGERS IV SOLN: INTRAVENOUS | Status: DC

## 2020-02-04 MED ORDER — DIPHENHYDRAMINE HCL 50 MG/ML IJ SOLN
12.5000 mg | INTRAMUSCULAR | Status: DC | PRN
  Administered 2020-02-05: 12.5 mg via INTRAVENOUS
  Filled 2020-02-04: qty 1

## 2020-02-04 MED ORDER — OXYCODONE-ACETAMINOPHEN 5-325 MG PO TABS
2.0000 | ORAL_TABLET | ORAL | Status: DC | PRN
Start: 2020-02-04 — End: 2020-02-05

## 2020-02-04 MED ORDER — OXYCODONE-ACETAMINOPHEN 5-325 MG PO TABS: 1.0000 | ORAL_TABLET | ORAL | Status: DC | PRN

## 2020-02-04 MED ORDER — SODIUM CHLORIDE 0.9 % IV SOLN
5.0000 10*6.[IU] | Freq: Once | INTRAVENOUS | Status: AC
  Administered 2020-02-04: 5 10*6.[IU] via INTRAVENOUS
  Filled 2020-02-04: qty 5

## 2020-02-04 MED ORDER — LACTATED RINGERS IV SOLN
500.0000 mL | INTRAVENOUS | Status: DC | PRN
Start: 2020-02-04 — End: 2020-02-05

## 2020-02-04 MED ORDER — ONDANSETRON HCL 4 MG/2ML IJ SOLN
4.0000 mg | Freq: Four times a day (QID) | INTRAMUSCULAR | Status: DC | PRN
  Administered 2020-02-05 (×2): 4 mg via INTRAVENOUS
  Filled 2020-02-04 (×2): qty 2

## 2020-02-04 MED ORDER — SOD CITRATE-CITRIC ACID 500-334 MG/5ML PO SOLN: 30.0000 mL | ORAL | Status: DC | PRN

## 2020-02-04 MED ORDER — OXYTOCIN BOLUS FROM INFUSION
333.0000 mL | Freq: Once | INTRAVENOUS | Status: AC
  Administered 2020-02-05: 333 mL via INTRAVENOUS

## 2020-02-04 MED ORDER — ACETAMINOPHEN 325 MG PO TABS: 650.0000 mg | ORAL_TABLET | ORAL | Status: DC | PRN

## 2020-02-04 NOTE — MAU Note (Signed)
Pt reports ROM at 1945, contractions

## 2020-02-04 NOTE — H&P (Signed)
OB ADMISSION/ HISTORY & PHYSICAL:  Admission Date: 02/04/2020  8:40 PM  Admit Diagnosis: Normal labor, SROM  [name] [name] is a 35 y.o. female [phone] [redacted]w[redacted]d presenting for LOF at 24, clear, no odor. Endorses active FM, denies vaginal bleeding. Hx SVD x2, uncomplicated, last birth 62/6948 w. Pelvis proven to 8#. PUPPs this preg w/ normal bile salts, well-managed w/ Triamcinolone cream. Denies skin irritation on admission. IDA w/ IV iron infusions this preg.   History of current pregnancy: [id number]   Patient entered care with [facility] at 12 wks.   EDC 02/04/20 by [place] @ 12+2 wks.   Anatomy scan: complete w/ anterior placenta.   Antenatal testing: none  Significant prenatal events: IV iron (Venofer) for IDA Patient Active Problem List   Diagnosis Date Noted  . Rh negative status during pregnancy 02/05/2020  . Vitamin D deficiency 02/05/2020  . AMA (advanced maternal age) multigravida 35+ 02/05/2020  . IDA (iron deficiency anemia) 02/05/2020  . Group beta Strep positive 02/05/2020  . Normal labor 02/04/2020  . PUPPP (pruritic urticarial papules and plaques of pregnancy) 06/06/2011  . Language Barrier (Arabic) 03/14/2011  . GERD (gastroesophageal reflux disease) 02/19/2011    Prenatal Labs: ABO, Rh: --/--/A NEG (01/29 2124) Antibody: NEG (01/29 2124) Rubella:   immune RPR: NON REACTIVE (02/07 0101)  HBsAg:   NR HIV:   NR GTT: passed 1 hr GBS:   positive GC/CHL: neg/neg Genetics: low-risk female Tdap/influenza vaccines: declined both   OB History  Gravida Para Term Preterm AB Living  4 2 2  0 1 2  SAB IAB Ectopic Multiple Live Births  0 1 0 0 2    # Outcome Date GA Lbr Len/2nd Weight Sex Delivery Anes PTL Lv  4 Current           3 Term 02/13/19 [redacted]w[redacted]d 06:06 / 00:05 3220 g F Vag-Spont EPI  LIV  2 Term 10/14/11 [redacted]w[redacted]d  3274 g F Vag-Spont EPI  LIV  1 IAB             Medical / Surgical History: Past medical history:  Past Medical History:  Diagnosis Date  . Anemia 2009  .  Anxiety   . Back pain 2010  . COVID-19   . Exposure to x-rays 2012   normal  . H. pylori infection 2019  . H/O candidiasis   . H/O varicella   . History of gastroesophageal reflux (GERD) 2010   taking with tums, helps   . Hypotension   . Migraines 2009  . Sleep apnea 2012    Past surgical history:  Past Surgical History:  Procedure Laterality Date  . COLPOSCOPY  04/02/2011  . NO PAST SURGERIES     Family History:  Family History  Problem Relation Age of Onset  . Asthma Mother   . Cancer Mother 34       breast, cervix and bone and liver  . Breast cancer Mother 68  . Asthma Father   . Cancer Other   . Heart disease Other   . Stroke Other   . Thyroid disease Other   . Diabetes Other   . Hypertension Other   . Hyperlipidemia Other   . Hypertension Maternal Aunt   . Heart disease Maternal Grandmother   . Hypertension Maternal Grandmother   . Cancer Maternal Grandmother   . Breast cancer Maternal Grandmother   . Hypertension Maternal Grandfather   . Breast cancer Maternal Aunt   . Anesthesia problems Neg Hx  Social History:  reports that she has never smoked. She has never used smokeless tobacco. She reports that she does not drink alcohol and does not use drugs.  Allergies: Other   Current Medications at time of admission:  Prior to Admission medications   Medication Sig Start Date End Date Taking? Authorizing Provider  acetaminophen (TYLENOL) 650 MG suppository Place 650 mg rectally every 4 (four) hours as needed.   Yes [provider]  omeprazole (PRILOSEC) 20 MG capsule Take 20 mg by mouth daily.   Yes [provider]  Cyanocobalamin (B-12) 1000 MCG SUBL Place 1,000 mcg under the tongue daily. 12/12/19   Brunetta Genera, MD  famotidine (PEPCID) 20 MG tablet Take 1 tablet (20 mg total) by mouth 2 (two) times daily. 07/18/19   Raylene Everts, MD  fluticasone [facility]) 50 MCG/ACT nasal spray Place 2 sprays into both nostrils daily.  07/18/19   Raylene Everts, MD    Review of Systems: Constitutional: Negative   HENT: Negative   Eyes: Negative   Respiratory: Negative   Cardiovascular: Negative   Gastrointestinal: Negative  Genitourinary: neg for bloody show, positive for LOF   Musculoskeletal: Negative   Skin: Negative   Neurological: Negative   Endo/Heme/Allergies: Negative   Psychiatric/Behavioral: Negative    Physical Exam: VS: Blood pressure 121/76, pulse 83, temperature 98.7 F (37.1 C), resp. rate 15, height 5\' 3"  (1.6 m), weight 82.1 kg, last menstrual period 05/04/2019, SpO2 100 %, unknown if currently breastfeeding. AAO x3, no signs of distress Cardiovascular: RRR Respiratory: Lung fields clear to ausculation GU/GI: Abdomen gravid, non-tender, non-distended, active FM, vertex, EFW 7.5# per Leopold's Extremities: trace edema, negative for pain, tenderness, and cords  Cervical exam:Dilation: 1.5 Effacement (%): 60 Station: -2 Exam by:: Apolinar Junes RN FHR: baseline rate 120 / variability moderate / accelerations present / absent decelerations TOCO: 2-5   Prenatal Transfer Tool  Maternal Diabetes: No Genetic Screening: Normal Maternal Ultrasounds/Referrals: Normal Fetal Ultrasounds or other Referrals:  None Maternal Substance Abuse:  No Significant Maternal Medications:  Meds include: Zoloft Significant Maternal Lab Results: Group B Strep positive    Assessment: 35 y.o. Q4B2010 [redacted]w[redacted]d  Latent stage of labor SROM FHR category 1 GBS positive IDA Rh negative Hx depression Pain management plan: IV sedation and epidural   Plan:  Admit to L&D Routine admission orders Epidural PRN PCN for GBS prophylaxis Active management of 3rd stage to minimize blood loss Continue Zoloft 50 mg PO daily Rhogam PP   Dr Alwyn Pea notified of admission and plan of care  Arrie Eastern MSN, CNM 02/04/2020 10:45 PM

## 2020-02-05 ENCOUNTER — Inpatient Hospital Stay (HOSPITAL_COMMUNITY): Payer: Medicaid Other | Admitting: Anesthesiology

## 2020-02-05 ENCOUNTER — Encounter (HOSPITAL_COMMUNITY): Payer: Self-pay | Admitting: Obstetrics & Gynecology

## 2020-02-05 DIAGNOSIS — O9902 Anemia complicating childbirth: Secondary | ICD-10-CM | POA: Diagnosis not present

## 2020-02-05 DIAGNOSIS — E559 Vitamin D deficiency, unspecified: Secondary | ICD-10-CM | POA: Insufficient documentation

## 2020-02-05 DIAGNOSIS — O26899 Other specified pregnancy related conditions, unspecified trimester: Secondary | ICD-10-CM | POA: Insufficient documentation

## 2020-02-05 DIAGNOSIS — D649 Anemia, unspecified: Secondary | ICD-10-CM | POA: Diagnosis not present

## 2020-02-05 DIAGNOSIS — D509 Iron deficiency anemia, unspecified: Secondary | ICD-10-CM | POA: Diagnosis present

## 2020-02-05 DIAGNOSIS — F32A Depression, unspecified: Secondary | ICD-10-CM | POA: Diagnosis not present

## 2020-02-05 DIAGNOSIS — O09529 Supervision of elderly multigravida, unspecified trimester: Secondary | ICD-10-CM

## 2020-02-05 DIAGNOSIS — Z3A40 40 weeks gestation of pregnancy: Secondary | ICD-10-CM | POA: Diagnosis not present

## 2020-02-05 DIAGNOSIS — B951 Streptococcus, group B, as the cause of diseases classified elsewhere: Secondary | ICD-10-CM | POA: Diagnosis present

## 2020-02-05 LAB — RPR: RPR Ser Ql: NONREACTIVE

## 2020-02-05 MED ORDER — SENNOSIDES-DOCUSATE SODIUM 8.6-50 MG PO TABS
2.0000 | ORAL_TABLET | Freq: Every day | ORAL | Status: DC
  Administered 2020-02-06: 2 via ORAL
  Filled 2020-02-05: qty 2

## 2020-02-05 MED ORDER — ZOLPIDEM TARTRATE 5 MG PO TABS
5.0000 mg | ORAL_TABLET | Freq: Every evening | ORAL | Status: DC | PRN
  Administered 2020-02-06: 5 mg via ORAL
  Filled 2020-02-05: qty 1

## 2020-02-05 MED ORDER — BENZOCAINE-MENTHOL 20-0.5 % EX AERO
1.0000 "application " | INHALATION_SPRAY | CUTANEOUS | Status: DC | PRN
  Administered 2020-02-06: 1 via TOPICAL
  Filled 2020-02-05: qty 56

## 2020-02-05 MED ORDER — OXYTOCIN-SODIUM CHLORIDE 30-0.9 UT/500ML-% IV SOLN
1.0000 m[IU]/min | INTRAVENOUS | Status: DC
  Administered 2020-02-05: 2 m[IU]/min via INTRAVENOUS
  Filled 2020-02-05: qty 500

## 2020-02-05 MED ORDER — ONDANSETRON HCL 4 MG PO TABS
4.0000 mg | ORAL_TABLET | ORAL | Status: DC | PRN
  Administered 2020-02-06: 4 mg via ORAL
  Filled 2020-02-05: qty 1

## 2020-02-05 MED ORDER — SERTRALINE HCL 50 MG PO TABS
50.0000 mg | ORAL_TABLET | Freq: Every day | ORAL | Status: DC
  Filled 2020-02-05: qty 1

## 2020-02-05 MED ORDER — WITCH HAZEL-GLYCERIN EX PADS: 1.0000 "application " | MEDICATED_PAD | CUTANEOUS | Status: DC | PRN

## 2020-02-05 MED ORDER — DIPHENHYDRAMINE HCL 25 MG PO CAPS: 25.0000 mg | ORAL_CAPSULE | Freq: Four times a day (QID) | ORAL | Status: DC | PRN

## 2020-02-05 MED ORDER — LIDOCAINE HCL (PF) 1 % IJ SOLN
INTRAMUSCULAR | Status: DC | PRN
  Administered 2020-02-05 (×2): 4 mL via EPIDURAL

## 2020-02-05 MED ORDER — ONDANSETRON HCL 4 MG/2ML IJ SOLN: 4.0000 mg | INTRAMUSCULAR | Status: DC | PRN

## 2020-02-05 MED ORDER — IBUPROFEN 600 MG PO TABS
600.0000 mg | ORAL_TABLET | Freq: Four times a day (QID) | ORAL | Status: DC
  Administered 2020-02-05 – 2020-02-06 (×6): 600 mg via ORAL
  Filled 2020-02-05 (×6): qty 1

## 2020-02-05 MED ORDER — TERBUTALINE SULFATE 1 MG/ML IJ SOLN: 0.2500 mg | Freq: Once | INTRAMUSCULAR | Status: DC | PRN

## 2020-02-05 MED ORDER — RHO D IMMUNE GLOBULIN 1500 UNIT/2ML IJ SOSY
300.0000 ug | PREFILLED_SYRINGE | Freq: Once | INTRAMUSCULAR | Status: AC
  Administered 2020-02-05: 300 ug via INTRAVENOUS
  Filled 2020-02-05: qty 2

## 2020-02-05 MED ORDER — DIBUCAINE (PERIANAL) 1 % EX OINT: 1.0000 "application " | TOPICAL_OINTMENT | CUTANEOUS | Status: DC | PRN

## 2020-02-05 MED ORDER — SIMETHICONE 80 MG PO CHEW: 80.0000 mg | CHEWABLE_TABLET | ORAL | Status: DC | PRN

## 2020-02-05 MED ORDER — TETANUS-DIPHTH-ACELL PERTUSSIS 5-2.5-18.5 LF-MCG/0.5 IM SUSY: 0.5000 mL | PREFILLED_SYRINGE | Freq: Once | INTRAMUSCULAR | Status: DC

## 2020-02-05 MED ORDER — SERTRALINE HCL 50 MG PO TABS: 50.0000 mg | ORAL_TABLET | Freq: Every day | ORAL | Status: DC

## 2020-02-05 MED ORDER — ACETAMINOPHEN 325 MG PO TABS
650.0000 mg | ORAL_TABLET | ORAL | Status: DC | PRN
  Administered 2020-02-05: 650 mg via ORAL
  Filled 2020-02-05: qty 2

## 2020-02-05 MED ORDER — TRIAMCINOLONE 0.1 % CREAM:EUCERIN CREAM 1:1
TOPICAL_CREAM | Freq: Two times a day (BID) | CUTANEOUS | Status: DC | PRN
  Filled 2020-02-05: qty 1

## 2020-02-05 MED ORDER — PRENATAL MULTIVITAMIN CH
1.0000 | ORAL_TABLET | Freq: Every day | ORAL | Status: DC
  Administered 2020-02-06: 1 via ORAL
  Filled 2020-02-05: qty 1

## 2020-02-05 MED ORDER — COCONUT OIL OIL: 1.0000 "application " | TOPICAL_OIL | Status: DC | PRN

## 2020-02-05 MED ORDER — SERTRALINE HCL 50 MG PO TABS
50.0000 mg | ORAL_TABLET | Freq: Every day | ORAL | Status: DC
Start: 2020-02-05 — End: 2020-02-07
  Administered 2020-02-05: 50 mg via ORAL
  Filled 2020-02-05: qty 1

## 2020-02-05 NOTE — Anesthesia Procedure Notes (Signed)
Epidural Patient location during procedure: OB Start time: 02/05/2020 8:46 AM End time: 02/05/2020 8:54 AM  Staffing Anesthesiologist: Josephine Igo, MD Performed: anesthesiologist   Preanesthetic Checklist Completed: patient identified, IV checked, site marked, risks and benefits discussed, surgical consent, monitors and equipment checked, pre-op evaluation and timeout performed  Epidural Patient position: sitting Prep: DuraPrep and site prepped and draped Patient monitoring: continuous pulse ox and blood pressure Approach: midline Location: L3-L4 Injection technique: LOR air  Needle:  Needle type: Tuohy  Needle gauge: 17 G Needle length: 9 cm and 9 Needle insertion depth: 5 cm cm Catheter type: closed end flexible Catheter size: 19 Gauge Catheter at skin depth: 10 cm Test dose: negative and Other  Assessment Events: blood not aspirated, injection not painful, no injection resistance, no paresthesia and negative IV test  Additional Notes Patient identified. Risks and benefits discussed including failed block, incomplete  Pain control, post dural puncture headache, nerve damage, paralysis, blood pressure Changes, nausea, vomiting, reactions to medications-both toxic and allergic and post Partum back pain. All questions were answered. Patient expressed understanding and wished to proceed. Sterile technique was used throughout procedure. Epidural site was Dressed with sterile barrier dressing. No paresthesias, signs of intravascular injection Or signs of intrathecal spread were encountered.  Patient was more comfortable after the epidural was dosed. Please see RN's note for documentation of vital signs and FHR which are stable. Reason for block:procedure for pain

## 2020-02-05 NOTE — Anesthesia Preprocedure Evaluation (Addendum)
Anesthesia Evaluation  Patient identified by MRN, date of birth, ID band Patient awake    Reviewed: Allergy & Precautions, Patient's Chart, lab work & pertinent test results  Airway Mallampati: II  TM Distance: >3 FB Neck ROM: Full    Dental no notable dental hx. (+) Teeth Intact   Pulmonary sleep apnea ,  Hx/o Covid-19 infection 4/21, mild according to note   Pulmonary exam normal breath sounds clear to auscultation       Cardiovascular negative cardio ROS Normal cardiovascular exam Rhythm:Regular Rate:Normal     Neuro/Psych  Headaches, PSYCHIATRIC DISORDERS Anxiety Depression    GI/Hepatic Neg liver ROS, GERD  Medicated and Controlled,  Endo/Other  Obesity  Renal/GU negative Renal ROS  negative genitourinary   Musculoskeletal negative musculoskeletal ROS (+) PUPP    Abdominal (+) + obese,   Peds  Hematology  (+) anemia ,   Anesthesia Other Findings   Reproductive/Obstetrics (+) Pregnancy                            Anesthesia Physical Anesthesia Plan  ASA: II  Anesthesia Plan: Epidural   Post-op Pain Management:    Induction:   PONV Risk Score and Plan:   Airway Management Planned: Natural Airway  Additional Equipment:   Intra-op Plan:   Post-operative Plan:   Informed Consent: I have reviewed the patients History and Physical, chart, labs and discussed the procedure including the risks, benefits and alternatives for the proposed anesthesia with the patient or authorized representative who has indicated his/her understanding and acceptance.       Plan Discussed with: Anesthesiologist  Anesthesia Plan Comments:         Anesthesia Quick Evaluation

## 2020-02-06 LAB — CBC
HCT: 28.3 % — ABNORMAL LOW (ref 36.0–46.0)
Hemoglobin: 9.4 g/dL — ABNORMAL LOW (ref 12.0–15.0)
MCH: 27.3 pg (ref 26.0–34.0)
MCHC: 33.2 g/dL (ref 30.0–36.0)
MCV: 82.3 fL (ref 80.0–100.0)
Platelets: 192 10*3/uL (ref 150–400)
RBC: 3.44 MIL/uL — ABNORMAL LOW (ref 3.87–5.11)
RDW: 19.7 % — ABNORMAL HIGH (ref 11.5–15.5)
WBC: 10 10*3/uL (ref 4.0–10.5)
nRBC: 0 % (ref 0.0–0.2)

## 2020-02-06 MED ORDER — IBUPROFEN 600 MG PO TABS: 600.0000 mg | ORAL_TABLET | Freq: Four times a day (QID) | ORAL | 0 refills | Status: DC

## 2020-02-06 MED ORDER — SERTRALINE HCL 50 MG PO TABS
50.0000 mg | ORAL_TABLET | Freq: Every day | ORAL | 2 refills | Status: DC
Start: 2020-02-06 — End: 2023-06-15

## 2020-02-06 MED ORDER — CALCIUM CARBONATE ANTACID 500 MG PO CHEW
400.0000 mg | CHEWABLE_TABLET | ORAL | Status: DC | PRN
  Administered 2020-02-06: 400 mg via ORAL
  Filled 2020-02-06: qty 2

## 2020-02-06 NOTE — Progress Notes (Signed)
[name] was referred for history of depression/anxiety. * Referral screened out by Clinical Social Worker because none of the following criteria appear to apply: ~ History of anxiety/depression during this pregnancy, or of post-partum depression following prior delivery. ~ Diagnosis of anxiety and/or depression within last 3 years. Per further chart review, it is noted that [name] was diagnosed with anxiety/depression in 2013.  OR * [name]'s symptoms currently being treated with medication and/or therapy. It also appears that [name] is on Zoloft per notes.     Please contact the Clinical Social Worker if needs arise, by [facility] request, or if [name] scores greater than 9/yes to question 10 on Edinburgh Postpartum Depression Screen.   [name] [name], MSW, LCSW [facility] [place] [phone]

## 2020-02-06 NOTE — Discharge Summary (Signed)
Postpartum Discharge Summary  Date of Service updated 02/06/20     Patient Name: [name] [name] DOB: 02-01-1985 MRN: [id number]  Date of admission: 02/04/2020 Delivery date:02/05/2020  Delivering provider: Noralyn Pick  Date of discharge: 02/06/2020  Admitting diagnosis: Normal labor [O80, Z37.9] Intrauterine pregnancy: [redacted]w[redacted]d     Secondary diagnosis:  Active Problems:   Rh negative status during pregnancy   PUPPP (pruritic urticarial papules and plaques of pregnancy)   Normal labor   AMA (advanced maternal age) multigravida 35+   IDA (iron deficiency anemia)   Group beta Strep positive   Depression  Additional problems: none    Discharge diagnosis: Term Pregnancy Delivered and IDA                                              Post partum procedures:none Augmentation: Pitocin Complications: None  Hospital course: Onset of Labor With Vaginal Delivery      35 y.o. yo [id number] at [redacted]w[redacted]d was admitted in Latent Labor on 02/04/2020. Patient had an uncomplicated labor course as follows:  Membrane Rupture Time/Date: 7:45 PM ,02/04/2020   Delivery Method:Vaginal, Spontaneous  Episiotomy: None  Lacerations:  None  Patient had an uncomplicated postpartum course.  She is ambulating, tolerating a regular diet, passing flatus, and urinating well. Patient is discharged home in stable condition on 02/06/20.  Newborn Data: Birth date:02/05/2020  Birth time:10:29 AM  Gender:Female  Living status:Living  Apgars:9 ,9  Weight:3076 g   Magnesium Sulfate received: No BMZ received: No Rhophylac:Yes MMR:N/A T-DaP:declined Flu: No Transfusion:Yes  Physical exam  Vitals:   02/05/20 2101 02/05/20 2128 02/06/20 0045 02/06/20 1448  BP: 107/63 101/66 104/71 107/70  Pulse: 68 75 78 69  Resp: $Remo'18 20 19 16  'Gfaoi$ Temp: 98.6 F (37 C) 99 F (37.2 C) 98.5 F (36.9 C) 98.1 F (36.7 C)  TempSrc: Oral Oral Oral Oral  SpO2:  100% 100% 100%  Weight:      Height:       General: alert, cooperative  and no distress Lochia: appropriate Uterine Fundus: firm Incision: N/A DVT Evaluation: No evidence of DVT seen on physical exam. No cords or calf tenderness. No significant calf/ankle edema. Labs: Lab Results  Component Value Date   WBC 10.0 02/06/2020   HGB 9.4 (L) 02/06/2020   HCT 28.3 (L) 02/06/2020   MCV 82.3 02/06/2020   PLT 192 02/06/2020   CMP Latest Ref Rng & Units 01/18/2020  Glucose 70 - 99 mg/dL 89  BUN 6 - 20 mg/dL 5(L)  Creatinine 0.44 - 1.00 mg/dL 0.58  Sodium 135 - 145 mmol/L 137  Potassium 3.5 - 5.1 mmol/L 3.5  Chloride 98 - 111 mmol/L 109  CO2 22 - 32 mmol/L 22  Calcium 8.9 - 10.3 mg/dL 8.6(L)  Total Protein 6.5 - 8.1 g/dL 6.9  Total Bilirubin 0.3 - 1.2 mg/dL 0.9  Alkaline Phos 38 - 126 U/L 128(H)  AST 15 - 41 U/L 8(L)  ALT 0 - 44 U/L <6   Edinburgh Score: Edinburgh Postnatal Depression Scale Screening Tool 02/06/2020  I have been able to laugh and see the funny side of things. 0  I have looked forward with enjoyment to things. 0  I have blamed myself unnecessarily when things went wrong. 2  I have been anxious or worried for no good reason. 0  I have felt scared  or panicky for no good reason. 1  Things have been getting on top of me. 0  I have been so unhappy that I have had difficulty sleeping. 0  I have felt sad or miserable. 0  I have been so unhappy that I have been crying. 0  The thought of harming myself has occurred to me. 0  Edinburgh Postnatal Depression Scale Total 3      After visit meds:  Allergies as of 02/06/2020      Reactions   Fentanyl Hives   Reports severe itching with red rash after IV Fentanyl.    Other Anxiety, Rash   Medication for sleep given while inpatient; doesn't remember name, no matching medications listed on chart history.  Possibly ambien, lunesta, benadryl, doxylamine.      Medication List    STOP taking these medications   omeprazole 40 MG capsule Commonly known as: PRILOSEC     TAKE these medications    acetaminophen 650 MG suppository Commonly known as: TYLENOL Place 650 mg rectally every 4 (four) hours as needed.   B-12 1000 MCG Subl Place 1,000 mcg under the tongue daily.   ibuprofen 600 MG tablet Commonly known as: ADVIL Take 1 tablet (600 mg total) by mouth every 6 (six) hours. Start taking on: February 07, 2020   nystatin cream Commonly known as: MYCOSTATIN Apply 1 application topically 2 (two) times daily.   sertraline 50 MG tablet Commonly known as: ZOLOFT Take 1 tablet (50 mg total) by mouth at bedtime.   triamcinolone 0.1 % Commonly known as: KENALOG Apply 1 application topically 3 (three) times daily as needed (For itching.).        Discharge home in stable condition Infant Feeding: Breast Infant Disposition:home with mother Discharge instruction: per After Visit Summary and Postpartum booklet. Activity: Advance as tolerated. Pelvic rest for 6 weeks.  Diet: routine diet Anticipated Birth Control: Condoms Postpartum Appointment:6 weeks Additional Postpartum F/U: none Future Appointments: Future Appointments  Date Time Provider [place]  03/21/2020  2:00 PM [facility] None  03/21/2020  2:40 PM Brunetta Genera, MD [facility] [place] None   Follow up Visit:  [facility]. Schedule an appointment as soon as possible for a visit in 6 week(s).   Specialty: Obstetrics and Gynecology Contact information: [address] [place] [address] [phone]                  02/06/2020 Arrie Eastern, CNM

## 2020-02-06 NOTE — Anesthesia Postprocedure Evaluation (Signed)
Anesthesia Post Note  Patient: [name] [name]  Procedure(s) Performed: AN AD HOC LABOR EPIDURAL     Patient location during evaluation: Mother Baby Anesthesia Type: Epidural Level of consciousness: awake, awake and alert and oriented Pain management: pain level controlled Vital Signs Assessment: post-procedure vital signs reviewed and stable Respiratory status: spontaneous breathing, nonlabored ventilation and respiratory function stable Cardiovascular status: stable Postop Assessment: no headache, patient able to bend at knees, no apparent nausea or vomiting, adequate PO intake, able to ambulate and no backache Anesthetic complications: no   No complications documented.  Last Vitals:  Vitals:   02/05/20 2128 02/06/20 0045  BP: 101/66 104/71  Pulse: 75 78  Resp: 20 19  Temp: 37.2 C 36.9 C  SpO2: 100% 100%    Last Pain:  Vitals:   02/06/20 0603  TempSrc:   PainSc: 2    Pain Goal: Patients Stated Pain Goal: 0 (02/05/20 0300)                 Elizabelle Fite

## 2020-02-06 NOTE — Lactation Note (Signed)
This note was copied from a baby's chart. Lactation Consultation Note  Patient Name: [name] [name] GYBWL'S Date: 02/06/2020 Reason for consult: Initial assessment;Term Age:36 hours  Initial visit to 14 hours old infant of a P3 mother. Infant is latched to left breast, cradle position upon arrival. Mother states infant has been breastfeeding for ~60 minutes. LC and [place] student observed infant sleeping, holding nipple not actively sucking. Mother reports infant latching after delivery and skin to skin for ~12 hours. Mother explains she plans to breastfeed for a couple of weeks so:"infant can have the befits of the first milk". Mother states she has two other children at home.  Reviewed with mother average size of a NB stomach. Talked about infant's hunger and fullness cues.  Discussed milk coming to volume. Reviewed newborn behavior and expectations during first days of life. Reinforced following supplementation volume guideline when offering formula.   Feeding plan:  1. Breastfeed following hunger cues.  2. Offer breast 8 - 12 times in 24h period to establish good milk supply.   3. Pump or hand-express and offer EBM prior to formula supplementation. 4. If needed supplement with formula following guidelines, paced bottle feeding and fullness cues.   5. Encouraged maternal rest, hydration and food intake.  6. Contact Lactation Services or local resources for support, questions or concerns.    All questions answered at this time. Provided Lactation services brochure.   Maternal Data Formula Feeding for Exclusion: Yes Reason for exclusion: Mother's choice to formula and breast feed on admission Does the patient have breastfeeding experience prior to this delivery?: Yes  Feeding Feeding Type: Breast Fed  Interventions Interventions: Breast feeding basics reviewed;Skin to skin;Adjust position;Support pillows;Position options  Lactation Tools Discussed/Used WIC Program: Yes   Consult  Status Consult Status: Follow-up Date: 02/07/20 Follow-up type: In-patient    Jozef Eisenbeis A Higuera Ancidey 02/06/2020, 1:25 PM

## 2020-02-06 NOTE — Progress Notes (Signed)
PPD# 1 SVD w/ intact perineum Information for the patient's newborn:  Loy, Little [[id number]]  female    Baby Name Jarrett Soho Circumcision N.A   S:   Reports feeling good. States that she was given an IV medication during labor for pain management and it caused her to itch and she developed a rash. Pt was given IV Fentanyl for pain management in labor and recalls that as the offending agent. She request that it be added to her list of allergies. Pt request an abd binder for comfort and support. Denies S/S of anemia, advised to continue oral iron and to consume iron rich foods.   Tolerating PO fluid and solids No nausea or vomiting Bleeding is light, no clots Pain controlled with PO meds Up ad lib / ambulatory / voiding w/o difficulty Feeding: Breast    O:   VS: BP 104/71 (BP Location: Left Arm)   Pulse 78   Temp 98.5 F (36.9 C) (Oral)   Resp 19   Ht 5\' 3"  (1.6 m)   Wt 82.1 kg   LMP 05/04/2019   SpO2 100%   Breastfeeding Unknown   BMI 32.06 kg/m   LABS:  Recent Labs    02/04/20 2124 02/06/20 0500  WBC 10.2 10.0  HGB 11.3* 9.4*  PLT 209 192   Blood type: --/--/A NEG (01/30 1336) Rubella:                        I&O: Intake/Output      01/30 0701 01/31 0700 01/31 0701 02/01 0700   Urine (mL/kg/hr) 500 (0.3)    Blood 27    Total Output 527    Net -527           Physical Exam: Alert and oriented X3 Lungs: Clear and unlabored Heart: regular rate and rhythm / no mumurs Abdomen: soft, non-tender, non-distended  Fundus: firm, non-tender Perineum: non-edematous, intact Lochia: appropriate Extremities: trace edema, no calf pain or tenderness    A:  PPD # 1 Normal exam IDA    -Hgb 9.4    -asymptomatic    -continue oral iron Hx depression    -mood stable    -continue Zoloft 50 mg PO daily PUPP    -previous normal bile salts    -rash resolving, no pruritis since delivery   P:  Routine post partum orders May have abd binder Anticipate D/C on  02/07/20  Plan reviewed w/ Dr. Jethro Bastos, MSN, CNM 02/06/2020, 1:37 PM

## 2020-02-07 LAB — RH IG WORKUP (INCLUDES ABO/RH)
ABO/RH(D): A NEG
Fetal Screen: NEGATIVE
Gestational Age(Wks): 40
Unit division: 0

## 2020-02-11 ENCOUNTER — Inpatient Hospital Stay (HOSPITAL_COMMUNITY)
Admission: AD | Admit: 2020-02-11 | Payer: Medicaid Other | Source: Home / Self Care | Admitting: Obstetrics and Gynecology

## 2020-02-11 ENCOUNTER — Inpatient Hospital Stay (HOSPITAL_COMMUNITY): Payer: Medicaid Other

## 2020-03-20 NOTE — Progress Notes (Signed)
HEMATOLOGY/ONCOLOGY CONSULTATION NOTE  Date of Service: 03/21/2020  Patient Care Team: Patient, No Pcp Per as PCP - General (General Practice)  CHIEF COMPLAINTS/PURPOSE OF CONSULTATION:  Anemia in pregnancy  HISTORY OF PRESENTING ILLNESS:   [name] [name] is a wonderful 35 y.o. female who has been referred to [place] by Dr. Alwyn Pea for evaluation and management of anemia. The pt reports that she is doing well overall.   The pt reports that she delivered her second child in February of this year. Pt was anemic towards the end of her last pregnancy. She contracted a mild COVID19 infection in April. Pt is currently [redacted] weeks pregnant and is expected to deliver at the end of January. Pt had normal menstrual periods between pregnancies. She has a history of anemia even outside of pregnancy.  She was started on PO Iron after delivery, but stopped due to significant constipation. She suffered from constipation for 2 months and tried several stools softeners and laxatives prior to starting Linzess, which resolved her constipation. Pt had issues with constipation prior to starting PO Iron. Pt has not been evaluated by a Gastroenterologist for this. She has been unable to take her prenatal vitamin as prescribed due to nausea and vomiting. She had a H. Pylori infection last June for which she received antibiotics, but she is unsure if this cleared the infection. She is taking Linzess regularly and Famotidine as needed.  Pt reports that a few times per week she becomes very dizzy, to the point that she feels nauseous and needs to lay down. Pt has to rest for 15 minutes for this to resolve. She denies any fainting during these episodes. Pt is currently taking Unisom over the counter to help with her sleeplessness. She notes that she has shortness of breath when sleeping at night, which is new during her current pregnancy. She feels weakness and shortness of breath when moving about the house during the day. Pt  has been eating well and gaining weight appropriately.  Most recent lab results (11/15/2019) of CBC is as follows: all values are WNL except for RBC at 3.35, Hgb at 8.2, HCT at 25.7, MCV at 77, MCH at 24.5, BUN at 4, Creatinine at 0.44, Calcium at 8.5.  On review of systems, pt reports dizziness, sleeplessness, rash, fatigue, SOB and denies syncope, fevers, chills, night sweats, low appetite, unexpected weight loss and any other symptoms.   On PMHx the pt reports Anemia, COVID19, H. Pylori infection, GERD. On Family Hx the pt reports no Thalassemia trait or other blood disorders.  INTERVAL HISTORY:  [name] [name] is a wonderful 35 y.o. female who is here for evaluation and management of anemia. The patient's last visit with [place] was on 01/18/2020. The pt reports that she is doing well overall.  The pt reports that she had a successful pregnancy and a young boy a month ago. The pt notes that she has been feeling more dizzy lately and eyes bloated. The pt is not currently on any medications or vitamins. The pt is not breast feeding. The pt notes that she sleeps around three hours each night. The pt notes she stopped taking the Vitamin B12 soon after delivery. She notes no excessive bleeding.  Lab results today 03/21/2020 of CBC w/diff and CMP is as follows: all values are WNL. CMP pending. 03/21/2020 Ferritin 14 03/21/2020 Iron saturation 18% 03/21/2020 Vitamin B12 - 303  On review of systems, pt reports lack of sleep, dizziness and denies new lumps/bumps,  abdominal pain, back pain, leg swelling, and any other symptoms.  MEDICAL HISTORY:  Past Medical History:  Diagnosis Date  . Anemia 2009  . Anxiety   . Back pain 2010  . COVID-19   . Exposure to x-rays 2012   normal  . H. pylori infection 2019  . H/O candidiasis   . H/O varicella   . History of gastroesophageal reflux (GERD) 2010   taking with tums, helps   . Hypotension   . Migraines 2009  . Sleep apnea 2012    SURGICAL  HISTORY: Past Surgical History:  Procedure Laterality Date  . COLPOSCOPY  04/02/2011  . NO PAST SURGERIES      SOCIAL HISTORY: Social History   Socioeconomic History  . Marital status: Married    Spouse name: Not on file  . Number of children: Not on file  . Years of education: Not on file  . Highest education level: Not on file  Occupational History  . Occupation: unemployed   Tobacco Use  . Smoking status: Never Smoker  . Smokeless tobacco: Never Used  Vaping Use  . Vaping Use: Never used  Substance and Sexual Activity  . Alcohol use: No  . Drug use: No  . Sexual activity: Not Currently    Birth control/protection: None  Other Topics Concern  . Not on file  Social History Narrative   Moved to [place] from [place] 7 years ago.   Married, husband age 32.   Muslim-no pork, needs female provider.    Mom is actively involved with patient. She has been here for 25 years.    Social Determinants of Health   Financial Resource Strain: Not on file  Food Insecurity: Not on file  Transportation Needs: Not on file  Physical Activity: Not on file  Stress: Not on file  Social Connections: Not on file  Intimate Partner Violence: Not on file    FAMILY HISTORY: Family History  Problem Relation Age of Onset  . Asthma Mother   . Cancer Mother 108       breast, cervix and bone and liver  . Breast cancer Mother 40  . Asthma Father   . Cancer Other   . Heart disease Other   . Stroke Other   . Thyroid disease Other   . Diabetes Other   . Hypertension Other   . Hyperlipidemia Other   . Hypertension Maternal Aunt   . Heart disease Maternal Grandmother   . Hypertension Maternal Grandmother   . Cancer Maternal Grandmother   . Breast cancer Maternal Grandmother   . Hypertension Maternal Grandfather   . Breast cancer Maternal Aunt   . Anesthesia problems Neg Hx     ALLERGIES:  is allergic to fentanyl and other.  MEDICATIONS:  Current Outpatient Medications  Medication  Sig Dispense Refill  . acetaminophen (TYLENOL) 650 MG suppository Place 650 mg rectally every 4 (four) hours as needed.    . Cyanocobalamin (B-12) 1000 MCG SUBL Place 1,000 mcg under the tongue daily. 30 tablet 3  . ibuprofen (ADVIL) 600 MG tablet Take 1 tablet (600 mg total) by mouth every 6 (six) hours. 30 tablet 0  . nystatin cream (MYCOSTATIN) Apply 1 application topically 2 (two) times daily.    . sertraline (ZOLOFT) 50 MG tablet Take 1 tablet (50 mg total) by mouth at bedtime. 30 tablet 2  . triamcinolone (KENALOG) 0.1 % Apply 1 application topically 3 (three) times daily as needed (For itching.).     No current  facility-administered medications for this visit.    REVIEW OF SYSTEMS:   10 Point review of Systems was done is negative except as noted above.   PHYSICAL EXAMINATION: ECOG PERFORMANCE STATUS: 1 - Symptomatic but completely ambulatory  . Vitals:   03/21/20 1505  BP: 115/80  Pulse: 66  Resp: 20  Temp: 98.1 F (36.7 C)  SpO2: 100%   Filed Weights   03/21/20 1505  Weight: 168 lb 12.8 oz (76.6 kg)   .Body mass index is 29.9 kg/m.  GENERAL:alert, in no acute distress and comfortable SKIN: no acute rashes, no significant lesions EYES: conjunctiva are pink and non-injected, sclera anicteric OROPHARYNX: MMM, no exudates, no oropharyngeal erythema or ulceration NECK: supple, no JVD LYMPH:  no palpable lymphadenopathy in the cervical, axillary or inguinal regions LUNGS: clear to auscultation b/l with normal respiratory effort HEART: regular rate & rhythm ABDOMEN:  normoactive bowel sounds , non tender, not distended. Extremity: no pedal edema PSYCH: alert & oriented x 3 with fluent speech NEURO: no focal motor/sensory deficits  LABORATORY DATA:  I have reviewed the data as listed  . CBC Latest Ref Rng & Units 03/27/2020 03/21/2020 02/06/2020  WBC 4.0 - 10.5 K/uL 6.5 6.4 10.0  Hemoglobin 12.0 - 15.0 g/dL 12.3 12.3 9.4(L)  Hematocrit 36.0 - 46.0 % 38.9 39.4  28.3(L)  Platelets 150 - 400 K/uL 232 210 192    . CMP Latest Ref Rng & Units 03/27/2020 03/21/2020 01/18/2020  Glucose 70 - 99 mg/dL 95 78 89  BUN 6 - 20 mg/dL 13 7 5(L)  Creatinine 0.44 - 1.00 mg/dL 0.70 0.75 0.58  Sodium 135 - 145 mmol/L 141 140 137  Potassium 3.5 - 5.1 mmol/L 3.9 3.9 3.5  Chloride 98 - 111 mmol/L 108 108 109  CO2 22 - 32 mmol/L 24 26 22   Calcium 8.9 - 10.3 mg/dL 8.6(L) 9.0 8.6(L)  Total Protein 6.5 - 8.1 g/dL 7.6 7.7 6.9  Total Bilirubin 0.3 - 1.2 mg/dL 0.7 0.5 0.9  Alkaline Phos 38 - 126 U/L 84 83 128(H)  AST 15 - 41 U/L 13(L) 14(L) 8(L)  ALT 0 - 44 U/L 12 9 <6   . Lab Results  Component Value Date   IRON 56 03/21/2020   TIBC 313 03/21/2020   IRONPCTSAT 18 (L) 03/21/2020   (Iron and TIBC)  Lab Results  Component Value Date   FERRITIN 14 03/21/2020   B12 303 Component     Latest Ref Rng & Units 12/08/2019  Folate, Hemolysate     Not Estab. ng/mL 300.0  HCT     34.0 - 46.6 % 27.3 (L)  Folate, RBC     >498 ng/mL 1,099  Vitamin B12     180 - 914 pg/mL 93 (L)    RADIOGRAPHIC STUDIES: I have personally reviewed the radiological images as listed and agreed with the findings in the report. No results found.  ASSESSMENT & PLAN:   35 yo with   1) Severe Anemia in pregnancy 2) Severe Iron deficiency in pregnancy 3) Poor tolerance to po iron 4) B12 deficiency  5) strong fhx of breast cancer PLAN: -Discussed pt labwork today, 03/21/2020; blood counts all completely normal, other labs pending. -Advised pt we will monitor Iron and B12 levels once labs received. -Discussed pt's prior mammogram and biopsy. Recommended pt f/u regarding further genetic testing. Will send urgent referral due to pt's Medicaid ending March 31.  -Recommended pt receive routine mammogram screenings.  -Recommended pt find a PCP for  routine checks and pap smears and all age related screenings. -Continue B12 SL 1000 mcg SL daily OTC -Will get mammogram ASAP. -Will see back  via phone in 4 weeks.    FOLLOW UP: Mammogram ASAP in 1-2 week Referral to genetic counsellor in 1 week (patient insurance is running out at the end of the month). Phone visit with Dr Irene Limbo in 4 weeks to discuss MMG   The total time spent in the appt was 20 minutes and more than 50% was on counseling and direct patient cares.  All of the patient's questions were answered with apparent satisfaction. The patient knows to call the clinic with any problems, questions or concerns.    Sullivan Lone MD [place] AAHIVMS [facility] Hematology/Oncology Physician [facility]  (Office):       [phone] (Work cell):  [phone] (Fax):           [phone]  03/21/2020 3:32 PM  I, Reinaldo Raddle, am acting as scribe for Dr. Sullivan Lone, MD.     .I have reviewed the above documentation for accuracy and completeness, and I agree with the above. Brunetta Genera MD

## 2020-03-21 ENCOUNTER — Inpatient Hospital Stay (HOSPITAL_BASED_OUTPATIENT_CLINIC_OR_DEPARTMENT_OTHER): Payer: Medicaid Other | Admitting: Hematology

## 2020-03-21 ENCOUNTER — Other Ambulatory Visit: Payer: Self-pay

## 2020-03-21 ENCOUNTER — Inpatient Hospital Stay: Payer: Medicaid Other | Attending: Hematology

## 2020-03-21 ENCOUNTER — Telehealth: Payer: Self-pay | Admitting: Hematology

## 2020-03-21 ENCOUNTER — Other Ambulatory Visit: Payer: Self-pay | Admitting: *Deleted

## 2020-03-21 VITALS — BP 115/80 | HR 66 | Temp 98.1°F | Resp 20 | Ht 63.0 in | Wt 168.8 lb

## 2020-03-21 DIAGNOSIS — E538 Deficiency of other specified B group vitamins: Secondary | ICD-10-CM

## 2020-03-21 DIAGNOSIS — K5900 Constipation, unspecified: Secondary | ICD-10-CM | POA: Insufficient documentation

## 2020-03-21 DIAGNOSIS — Z331 Pregnant state, incidental: Secondary | ICD-10-CM | POA: Insufficient documentation

## 2020-03-21 DIAGNOSIS — Z8719 Personal history of other diseases of the digestive system: Secondary | ICD-10-CM | POA: Diagnosis not present

## 2020-03-21 DIAGNOSIS — Z79899 Other long term (current) drug therapy: Secondary | ICD-10-CM | POA: Diagnosis not present

## 2020-03-21 DIAGNOSIS — D509 Iron deficiency anemia, unspecified: Secondary | ICD-10-CM | POA: Insufficient documentation

## 2020-03-21 DIAGNOSIS — D500 Iron deficiency anemia secondary to blood loss (chronic): Secondary | ICD-10-CM

## 2020-03-21 DIAGNOSIS — K219 Gastro-esophageal reflux disease without esophagitis: Secondary | ICD-10-CM | POA: Diagnosis not present

## 2020-03-21 DIAGNOSIS — O99013 Anemia complicating pregnancy, third trimester: Secondary | ICD-10-CM

## 2020-03-21 DIAGNOSIS — Z8616 Personal history of COVID-19: Secondary | ICD-10-CM | POA: Diagnosis not present

## 2020-03-21 DIAGNOSIS — R42 Dizziness and giddiness: Secondary | ICD-10-CM | POA: Diagnosis not present

## 2020-03-21 DIAGNOSIS — Z803 Family history of malignant neoplasm of breast: Secondary | ICD-10-CM

## 2020-03-21 LAB — CBC WITH DIFFERENTIAL (CANCER CENTER ONLY)
Abs Immature Granulocytes: 0.01 10*3/uL (ref 0.00–0.07)
Basophils Absolute: 0 10*3/uL (ref 0.0–0.1)
Basophils Relative: 0 %
Eosinophils Absolute: 0.1 10*3/uL (ref 0.0–0.5)
Eosinophils Relative: 1 %
HCT: 39.4 % (ref 36.0–46.0)
Hemoglobin: 12.3 g/dL (ref 12.0–15.0)
Immature Granulocytes: 0 %
Lymphocytes Relative: 34 %
Lymphs Abs: 2.2 10*3/uL (ref 0.7–4.0)
MCH: 26.9 pg (ref 26.0–34.0)
MCHC: 31.2 g/dL (ref 30.0–36.0)
MCV: 86 fL (ref 80.0–100.0)
Monocytes Absolute: 0.3 10*3/uL (ref 0.1–1.0)
Monocytes Relative: 4 %
Neutro Abs: 3.9 10*3/uL (ref 1.7–7.7)
Neutrophils Relative %: 61 %
Platelet Count: 210 10*3/uL (ref 150–400)
RBC: 4.58 MIL/uL (ref 3.87–5.11)
RDW: 14.3 % (ref 11.5–15.5)
WBC Count: 6.4 10*3/uL (ref 4.0–10.5)
nRBC: 0 % (ref 0.0–0.2)

## 2020-03-21 LAB — CMP (CANCER CENTER ONLY)
ALT: 9 U/L (ref 0–44)
AST: 14 U/L — ABNORMAL LOW (ref 15–41)
Albumin: 3.8 g/dL (ref 3.5–5.0)
Alkaline Phosphatase: 83 U/L (ref 38–126)
Anion gap: 6 (ref 5–15)
BUN: 7 mg/dL (ref 6–20)
CO2: 26 mmol/L (ref 22–32)
Calcium: 9 mg/dL (ref 8.9–10.3)
Chloride: 108 mmol/L (ref 98–111)
Creatinine: 0.75 mg/dL (ref 0.44–1.00)
GFR, Estimated: >60 mL/min (ref >60)
Glucose, Bld: 78 mg/dL (ref 70–99)
Potassium: 3.9 mmol/L (ref 3.5–5.1)
Sodium: 140 mmol/L (ref 135–145)
Total Bilirubin: 0.5 mg/dL (ref 0.3–1.2)
Total Protein: 7.7 g/dL (ref 6.5–8.1)

## 2020-03-21 LAB — VITAMIN B12: Vitamin B-12: 303 pg/mL (ref 180–914)

## 2020-03-21 NOTE — Telephone Encounter (Signed)
Scheduled per 03/16 scheduled message, patient has been called and notified. 

## 2020-03-22 LAB — FERRITIN: Ferritin: 14 ng/mL (ref 11–307)

## 2020-03-22 LAB — IRON AND TIBC
Iron: 56 ug/dL (ref 41–142)
Saturation Ratios: 18 % — ABNORMAL LOW (ref 21–57)
TIBC: 313 ug/dL (ref 236–444)
UIBC: 257 ug/dL (ref 120–384)

## 2020-03-23 ENCOUNTER — Telehealth: Payer: Self-pay | Admitting: Hematology

## 2020-03-23 ENCOUNTER — Other Ambulatory Visit: Payer: Self-pay

## 2020-03-23 DIAGNOSIS — O99013 Anemia complicating pregnancy, third trimester: Secondary | ICD-10-CM

## 2020-03-23 NOTE — Telephone Encounter (Signed)
Left message with follow-up appointments per 3/16 los. Gave option to call back to reschedule if needed. 

## 2020-03-26 ENCOUNTER — Other Ambulatory Visit: Payer: Medicaid Other

## 2020-03-26 ENCOUNTER — Encounter: Payer: Medicaid Other | Admitting: Genetic Counselor

## 2020-03-27 ENCOUNTER — Inpatient Hospital Stay (HOSPITAL_BASED_OUTPATIENT_CLINIC_OR_DEPARTMENT_OTHER): Payer: Medicaid Other | Admitting: Genetic Counselor

## 2020-03-27 ENCOUNTER — Other Ambulatory Visit: Payer: Self-pay

## 2020-03-27 ENCOUNTER — Inpatient Hospital Stay: Payer: Medicaid Other

## 2020-03-27 ENCOUNTER — Other Ambulatory Visit: Payer: Self-pay | Admitting: Genetic Counselor

## 2020-03-27 DIAGNOSIS — O99013 Anemia complicating pregnancy, third trimester: Secondary | ICD-10-CM

## 2020-03-27 DIAGNOSIS — Z8049 Family history of malignant neoplasm of other genital organs: Secondary | ICD-10-CM

## 2020-03-27 DIAGNOSIS — Z803 Family history of malignant neoplasm of breast: Secondary | ICD-10-CM | POA: Diagnosis not present

## 2020-03-27 DIAGNOSIS — D509 Iron deficiency anemia, unspecified: Secondary | ICD-10-CM | POA: Diagnosis not present

## 2020-03-27 LAB — CMP (CANCER CENTER ONLY)
ALT: 12 U/L (ref 0–44)
AST: 13 U/L — ABNORMAL LOW (ref 15–41)
Albumin: 3.7 g/dL (ref 3.5–5.0)
Alkaline Phosphatase: 84 U/L (ref 38–126)
Anion gap: 9 (ref 5–15)
BUN: 13 mg/dL (ref 6–20)
CO2: 24 mmol/L (ref 22–32)
Calcium: 8.6 mg/dL — ABNORMAL LOW (ref 8.9–10.3)
Chloride: 108 mmol/L (ref 98–111)
Creatinine: 0.7 mg/dL (ref 0.44–1.00)
GFR, Estimated: >60 mL/min (ref >60)
Glucose, Bld: 95 mg/dL (ref 70–99)
Potassium: 3.9 mmol/L (ref 3.5–5.1)
Sodium: 141 mmol/L (ref 135–145)
Total Bilirubin: 0.7 mg/dL (ref 0.3–1.2)
Total Protein: 7.6 g/dL (ref 6.5–8.1)

## 2020-03-27 LAB — CBC WITH DIFFERENTIAL (CANCER CENTER ONLY)
Abs Immature Granulocytes: 0.01 10*3/uL (ref 0.00–0.07)
Basophils Absolute: 0 10*3/uL (ref 0.0–0.1)
Basophils Relative: 0 %
Eosinophils Absolute: 0.1 10*3/uL (ref 0.0–0.5)
Eosinophils Relative: 2 %
HCT: 38.9 % (ref 36.0–46.0)
Hemoglobin: 12.3 g/dL (ref 12.0–15.0)
Immature Granulocytes: 0 %
Lymphocytes Relative: 37 %
Lymphs Abs: 2.4 10*3/uL (ref 0.7–4.0)
MCH: 26.9 pg (ref 26.0–34.0)
MCHC: 31.6 g/dL (ref 30.0–36.0)
MCV: 84.9 fL (ref 80.0–100.0)
Monocytes Absolute: 0.3 10*3/uL (ref 0.1–1.0)
Monocytes Relative: 5 %
Neutro Abs: 3.6 10*3/uL (ref 1.7–7.7)
Neutrophils Relative %: 56 %
Platelet Count: 232 10*3/uL (ref 150–400)
RBC: 4.58 MIL/uL (ref 3.87–5.11)
RDW: 13.9 % (ref 11.5–15.5)
WBC Count: 6.5 10*3/uL (ref 4.0–10.5)
nRBC: 0 % (ref 0.0–0.2)

## 2020-03-27 LAB — GENETIC SCREENING ORDER

## 2020-03-28 ENCOUNTER — Encounter: Payer: Self-pay | Admitting: Genetic Counselor

## 2020-03-28 DIAGNOSIS — Z803 Family history of malignant neoplasm of breast: Secondary | ICD-10-CM | POA: Insufficient documentation

## 2020-03-28 DIAGNOSIS — Z8049 Family history of malignant neoplasm of other genital organs: Secondary | ICD-10-CM | POA: Insufficient documentation

## 2020-03-28 NOTE — Progress Notes (Signed)
REFERRING PROVIDER: Brunetta Genera, MD [address] [place],  [place] [address]  PRIMARY PROVIDER:  Patient, No Pcp Per  PRIMARY REASON FOR VISIT:  1. Family history of breast cancer   2. Family history of uterine cancer      HISTORY OF PRESENT ILLNESS:   Ms. Ausley, a 35 y.o. female, was seen for a [facility] cancer genetics consultation at the request of Dr. Irene Limbo due to a family history of cancer.  Ms. Aikens presents to clinic today to discuss the possibility of a hereditary predisposition to cancer, genetic testing, and to further clarify her future cancer risks, as well as potential cancer risks for family members.   Ms. Houchen does not have a personal history of cancer.    RISK FACTORS:  Menarche was at age 54.  First live birth at age 46.  OCP use for approximately 0 years.  Ovaries intact: yes.  Hysterectomy: no.  Menopausal status: premenopausal.  HRT use: 0 years. Colonoscopy: no; not examined. Mammogram within the last year: no. Number of breast biopsies: 2. Any excessive radiation exposure in the past: no.   Past Medical History:  Diagnosis Date  . Anemia 2009  . Anxiety   . Back pain 2010  . COVID-19   . Exposure to x-rays 2012   normal  . Family history of breast cancer   . Family history of uterine cancer   . H. pylori infection 2019  . H/O candidiasis   . H/O varicella   . History of gastroesophageal reflux (GERD) 2010   taking with tums, helps   . Hypotension   . Migraines 2009  . Sleep apnea 2012    Past Surgical History:  Procedure Laterality Date  . COLPOSCOPY  04/02/2011  . NO PAST SURGERIES      Social History   Socioeconomic History  . Marital status: Married    Spouse name: Not on file  . Number of children: Not on file  . Years of education: Not on file  . Highest education level: Not on file  Occupational History  . Occupation: unemployed   Tobacco Use  . Smoking status: Never Smoker  . Smokeless tobacco: Never  Used  Vaping Use  . Vaping Use: Never used  Substance and Sexual Activity  . Alcohol use: No  . Drug use: No  . Sexual activity: Not Currently    Birth control/protection: None  Other Topics Concern  . Not on file  Social History Narrative   Moved to [place] from [place] 7 years ago.   Married, husband age 60.   Muslim-no pork, needs female provider.    Mom is actively involved with patient. She has been here for 25 years.    Social Determinants of Health   Financial Resource Strain: Not on file  Food Insecurity: Not on file  Transportation Needs: Not on file  Physical Activity: Not on file  Stress: Not on file  Social Connections: Not on file     FAMILY HISTORY:  We obtained a detailed, 4-generation family history.  Significant diagnoses are listed below: Family History  Problem Relation Age of Onset  . Asthma Mother   . Cancer Mother 66       breast, cervix and bone and liver  . Breast cancer Mother 28       mets to bone and liver  . Asthma Father   . Cancer Other   . Heart disease Other   . Stroke Other   .  Thyroid disease Other   . Diabetes Other   . Hypertension Other   . Hyperlipidemia Other   . Hypertension Maternal Aunt   . Heart disease Maternal Grandmother   . Hypertension Maternal Grandmother   . Uterine cancer Maternal Grandmother 60  . Hypertension Maternal Grandfather   . Heart Problems Maternal Grandfather   . Breast cancer Maternal Aunt 49  . Heart Problems Maternal Uncle   . Heart Problems Maternal Uncle   . Anesthesia problems Neg Hx    Ms. Semidey has three daughters (ages 31, 64, and 72 month). She has one brother (age 37). None of these relatives have had cancer.  Ms. Giroux's mother died at age 50 with metastatic breast cancer (first diagnosed around age 17). There are two maternal aunts and two maternal uncles. One aunt was diagnosed with breast cancer at age 29. There is no known cancer among maternal cousins. Ms. Noguchi's maternal grandmother  died at age 75 and had a history of uterine cancer at age 14. Her maternal grandfather died without cancer. A maternal great-uncle (MGM's brother) had cancer, although Ms. Dungan does not know the type of cancer.  Ms. Tremper's father is alive at age 20 without cancer. There are four paternal aunts and two paternal uncles. There is no known cancer among paternal aunts/uncles or paternal cousins. Ms. Masini's paternal grandmother died at age 4 without cancer. Her paternal grandfather died older than 89 without cancer.   Ms. Vos is unaware of previous family history of genetic testing for hereditary cancer risks. Patient's maternal and paternal ancestors are of [place] descent. There is no reported Ashkenazi Jewish ancestry. There is no known consanguinity.  GENETIC COUNSELING ASSESSMENT: Ms. Dooley is a 35 y.o. female with a family history of breast cancer and uterine cancer which is somewhat suggestive of a hereditary cancer syndrome and predisposition to cancer. We, therefore, discussed and recommended the following at today's visit.   DISCUSSION: We discussed that approximately 5-10% of breast cancer is hereditary, with most cases associated with the BRCA1 and BRCA2 genes. There are other genes that can be associated with hereditary breast cancer syndromes. These include ATM, CHEK2, PALB2, etc. We discussed that testing is beneficial for several reasons, including knowing about other cancer risks, identifying potential screening and risk-reduction options that may be appropriate, and to understand if other family members could be at risk for cancer and allow them to undergo genetic testing.   We reviewed the characteristics, features and inheritance patterns of hereditary cancer syndromes. We also discussed genetic testing, including the appropriate family members to test, the process of testing, insurance coverage and turn-around-time for results. We discussed the implications of a negative, positive and/or  variant of uncertain significant result. We recommended Ms. Griggs pursue genetic testing for the CancerNext-Expanded + RNAinsight gene panel.   The CancerNext-Expanded + RNAinsight gene panel offered by [employer] and includes sequencing and rearrangement analysis for the following 77 genes: AIP, ALK, APC, ATM, AXIN2, BAP1, BARD1, BLM, BMPR1A, BRCA1, BRCA2, BRIP1, CDC73, CDH1, CDK4, CDKN1B, CDKN2A, CHEK2, CTNNA1, DICER1, FANCC, FH, FLCN, GALNT12, KIF1B, LZTR1, MAX, MEN1, MET, MLH1, MSH2, MSH3, MSH6, MUTYH, NBN, NF1, NF2, NTHL1, PALB2, PHOX2B, PMS2, POT1, PRKAR1A, PTCH1, PTEN, RAD51C, RAD51D, RB1, RECQL, RET, SDHA, SDHAF2, SDHB, SDHC, SDHD, SMAD4, SMARCA4, SMARCB1, SMARCE1, STK11, SUFU, TMEM127, TP53, TSC1, TSC2, VHL and XRCC2 (sequencing and deletion/duplication); EGFR, EGLN1, HOXB13, KIT, MITF, PDGFRA, POLD1 and POLE (sequencing only); EPCAM and GREM1 (deletion/duplication only). RNA data is routinely analyzed for use in  variant interpretation for all genes.  Based on Ms. Eiland's family history of cancer, she meets medical criteria for genetic testing. Despite that she meets criteria, there may still be an out of pocket cost. We discussed that if her out of pocket cost for testing is over $100, the laboratory will reach out to let her know. If the out of pocket cost of testing is less than $100 she will be billed by the genetic testing laboratory.   We discussed that some people do not want to undergo genetic testing due to fear of genetic discrimination. A federal law called the Genetic Information Non-Discrimination Act (GINA) of 2008 helps protect individuals against genetic discrimination based on their genetic test results. It impacts both health insurance and employment. With health insurance, it protects against increased premiums, being kicked off insurance or being forced to take a test in order to be insured.  For employment it protects against hiring, firing and promoting decisions based on  genetic test results. Health status due to a cancer diagnosis is not protected under GINA. Additionally, life, disability, and long-term care insurance is not protected under GINA.   PLAN: After considering the risks, benefits, and limitations, Ms. Vazques provided informed consent to pursue genetic testing and the blood sample was sent to [facility] for analysis of the CancerNext-Expanded + RNA insight panel. Results should be available within approximately two-three weeks' time, at which point they will be disclosed by telephone to Ms. Kibble, as will any additional recommendations warranted by these results. Ms. Baynes will receive a summary of her genetic counseling visit and a copy of her results once available. This information will also be available in Epic.   Ms. Morris's questions were answered to her satisfaction today. Our contact information was provided should additional questions or concerns arise. Thank you for the referral and allowing [place] to share in the care of your patient.   Clint Guy, [place], [facility] Licensed, Certified Dispensing [email or web address] Phone: [phone]  The patient was seen for a total of 30 minutes in face-to-face genetic counseling.  This patient was discussed with Drs. Magrinat, Lindi Adie and/or Burr Medico who agrees with the above.    _______________________________________________________________________ For Office Staff:  Number of people involved in session: 2 Was an Intern/ student involved with case: yes

## 2020-04-02 ENCOUNTER — Other Ambulatory Visit: Payer: Self-pay | Admitting: Hematology

## 2020-04-17 ENCOUNTER — Telehealth: Payer: Self-pay | Admitting: Hematology

## 2020-04-17 NOTE — Telephone Encounter (Signed)
Cancelled appt per pt requested 4/12 sch msg. Pt declined to r/s at this time.

## 2020-04-19 ENCOUNTER — Ambulatory Visit: Payer: Medicaid Other | Admitting: Hematology

## 2020-04-25 DIAGNOSIS — Z1379 Encounter for other screening for genetic and chromosomal anomalies: Secondary | ICD-10-CM | POA: Insufficient documentation

## 2020-04-26 ENCOUNTER — Encounter: Payer: Self-pay | Admitting: Genetic Counselor

## 2020-04-26 ENCOUNTER — Telehealth: Payer: Self-pay | Admitting: Genetic Counselor

## 2020-04-26 NOTE — Telephone Encounter (Signed)
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 We briefly reviewed the tumor risks associated with this gene. We discussed the option to set up a follow-up genetic counseling appointment, but [name] [name] declined because she is uninsured and cannot pay for the appointment. Since this is important information for her to receive, we made a plan to discuss this result in greater detail via telephone on Monday (4/25) at 3pm.   A variant of uncertain significance was also detected in the BAP1 gene called p.S414T (c.1240T>A). This variant is considered normal at this time and should not impact her medical management.

## 2020-04-30 ENCOUNTER — Ambulatory Visit: Payer: Self-pay | Admitting: Genetic Counselor

## 2020-04-30 ENCOUNTER — Encounter: Payer: Self-pay | Admitting: Genetic Counselor

## 2020-04-30 ENCOUNTER — Telehealth: Payer: Self-pay | Admitting: Genetic Counselor

## 2020-04-30 DIAGNOSIS — Z1379 Encounter for other screening for genetic and chromosomal anomalies: Secondary | ICD-10-CM

## 2020-04-30 DIAGNOSIS — D447 Neoplasm of uncertain behavior of aortic body and other paraganglia: Secondary | ICD-10-CM

## 2020-04-30 DIAGNOSIS — Z1589 Genetic susceptibility to other disease: Secondary | ICD-10-CM

## 2020-04-30 NOTE — Telephone Encounter (Signed)
Reviewed tumor risks and screening recommendations for individuals with an SDHA mutation in detail.

## 2020-04-30 NOTE — Progress Notes (Addendum)
GENETIC TEST RESULTS   Referring Provider: Brunetta Genera, MD [place],  [place] [address]     HISTORY OF PRESENT ILLNESS:  [name] [name] was seen in the [facility] on 03/27/2020 due to a family history of cancer and concern regarding a hereditary predisposition to cancer in the family. Please refer to the prior Genetics clinic note for more information regarding [name] [name]'s medical and family histories and our assessment at the time. She was called today via telephone to discuss her genetic test results and recommendations, all of which are discussed below.     FAMILY HISTORY:  We obtained a detailed, 4-generation family history.  Significant diagnoses are listed below: Family History  Problem Relation Age of Onset  . Asthma Mother   . Cancer Mother 76       breast, cervix and bone and liver  . Breast cancer Mother 4       mets to bone and liver  . Asthma Father   . Cancer Other   . Heart disease Other   . Stroke Other   . Thyroid disease Other   . Diabetes Other   . Hypertension Other   . Hyperlipidemia Other   . Hypertension Maternal Aunt   . Heart disease Maternal Grandmother   . Hypertension Maternal Grandmother   . Uterine cancer Maternal Grandmother 60  . Hypertension Maternal Grandfather   . Heart Problems Maternal Grandfather   . Breast cancer Maternal Aunt 30  . Heart Problems Maternal Uncle   . Heart Problems Maternal Uncle   . Anesthesia problems Neg Hx    [name] [name] has three daughters (ages 3, 44, and 72 month). She has one brother (age 3). None of these relatives have had cancer.  [name] [name] mother died at age 61 with metastatic breast cancer (first diagnosed around age 6). There are two maternal aunts and two maternal uncles. One aunt was diagnosed with breast cancer at age 69. There is no known cancer among maternal cousins. [name] [name] maternal grandmother died at age 58 and had a history of uterine cancer at age 54. Her maternal  grandfather died without cancer. A maternal great-uncle (MGM's brother) had cancer, although [name] [name] does not know the type of cancer.  [name] [name]'s father is alive at age 27 without cancer. There are four paternal aunts and two paternal uncles. There is no known cancer among paternal aunts/uncles or paternal cousins. [name] [name] paternal grandmother died at age 40 without cancer. Her paternal grandfather died older than 20 without cancer.   [name] [name] is unaware of previous family history of genetic testing for hereditary cancer risks. Patient's maternal and paternal ancestors are of [place] descent. There is no reported Ashkenazi Jewish ancestry. There is no known consanguinity.   GENETIC TESTING:  Genetic testing reported out on 04/25/2020 through the CancerNext-Expanded + RNAinsight panel offered by [employer]. 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    The CancerNext-Expanded + RNAinsight gene panel offered by [employer] and includes sequencing and rearrangement analysis for the following 77 genes: AIP, ALK, APC, ATM, AXIN2, BAP1, BARD1, BLM, BMPR1A, BRCA1, BRCA2, BRIP1, CDC73, CDH1, CDK4, CDKN1B, CDKN2A, CHEK2, CTNNA1, DICER1, FANCC, FH, FLCN, GALNT12, KIF1B, LZTR1, MAX, MEN1, MET, MLH1, MSH2, MSH3, MSH6, MUTYH, NBN, NF1, NF2, NTHL1, PALB2, PHOX2B, PMS2, POT1, PRKAR1A, PTCH1, PTEN, RAD51C, RAD51D, RB1, RECQL, RET, SDHA, SDHAF2, SDHB, SDHC, SDHD, SMAD4, SMARCA4, SMARCB1, SMARCE1, STK11, SUFU, TMEM127, TP53, TSC1,  TSC2, VHL and XRCC2 (sequencing and deletion/duplication); EGFR, EGLN1, HOXB13, KIT, MITF, PDGFRA, POLD1 and POLE (sequencing only); EPCAM and GREM1 (deletion/duplication only). RNA data is routinely analyzed for use in variant interpretation for all genes.     [name] [name] result does not explain her family history of cancer. We discussed with [name] [name] that because current genetic testing is not perfect, it is possible there may  be a gene mutation in one of these genes that current testing cannot detect, but that chance is small. We also discussed that there could be another gene that has not yet been discovered, or that we have not yet tested, that is responsible for the cancer diagnoses in the family. It is also possible there is a hereditary cause for the cancer in the family that [name] [name] did not inherit and therefore was not identified in his testing. Therefore, it is important to remain in touch with cancer genetics in the future so that we can continue to offer [name] [name] the most up to date genetic testing.    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 At this time, it is unknown if this variant is associated with increased cancer risk or if this is a normal finding, but most variants such as this get reclassified to being inconsequential. It should not be used to make medical management decisions. With time, we suspect the lab will determine the significance of this variant, if any. If we do learn more about it, we will try to contact [name] [name] to discuss it further. However, it is important to stay in touch with [place] periodically and keep the address and phone number up to date.   DISCUSSION OF SDHA GENE:  Single pathogenic variants in the SDHA gene are associated with the development of paragangliomas (PGL) and pheochromocytomas [facility]) as seen in hereditary paraganglioma-pheochromocytoma syndrome, gastrointestinal stromal tumors (GIST), and Carney-Stratakis syndrome (PMID: 56389373, 42876811, 57262035, 59741638, 45364680). It is unclear if pathogenic variants in [facility] are associated with other cancers as the data are limited and emerging (PMID: 32122482, 50037048). The clinical presentation is highly variable among those with a pathogenic variant in [place] and may be difficult to predict. An individual with a single SDHA pathogenic variant will  not necessarily develop cancer in their lifetime, but the risk for associated tumors is increased over that of the general population.  Paragangliomas/Pheochromocytomas:  Paragangliomas (PGL) are rare, adult-onset, typically benign neuroendocrine tumors that arise from paraganglia. Paraganglia are a collection of neuroendocrine tissues that are distributed throughout the body, from the middle ear and skull base (called head-and-neck paragangliomas, or HNP) to the pelvis. PGLs located outside the head and neck most commonly occur in the adrenal glands and are called pheochromocytomas [facility]). They are also known as chromaffin tumors. PCCs are catecholamine-secreting PGLs that are confined to the adrenal medulla, as defined by the The World Health Organization Tumor Classification; however, this term may also be used to refer to catecholamine-producing PGLs regardless of whether they are adrenal or extra-adrenal (PMID: 88916945). These lesions can cause excessive production of adrenal hormones, resulting in hypertension, headaches, tachycardia, anxiety, and sweaty or clammy skin in some individuals (PMID: 03888280, 03491791, 50569794). Most cases of PGL and [place] are sporadic, but approximately one-third are familial and due to an identifiable pathogenic variant in a susceptibility gene, such as SDHA, that can result in hereditary paraganglioma-pheochromocytoma syndrome (PMID: 80165537, 48270786, 75449201, 00712197, 58832549).  Pathogenic  variants in SDHA are primarily associated with a risk of developing [place], but sympathetic non-adrenal PGL of abdomen and thorax as well as HNP have been reported (PMID: 54098119, 14782956). The age at diagnosis is highly variable (PMID: 21308657) and the risk of malignancy appears to be low (PMID: 84696295, 28413244). Additionally, the penetrance appears to be low, meaning most individuals with a pathogenic variant in this gene will not develop PGL (PMID:  01027253).  Gastrointestinal stromal tumors:  Gastrointestinal stromal tumors (GIST) are rare, typically adult-onset sarcomas that may be either benign or malignant. They can occur sporadically or due to a heritable pathogenic variant in a susceptibility gene, such as SDHA. GISTs can develop anywhere along the GI tract, which includes the esophagus, stomach, gallbladder, liver, small intestine, colon, rectum, anus, and lining of the gut. GISTs arise from a specific cell type, interstitial cells of [place] (ICC), which line the walls of the GI tract. More than half of GISTs start in the stomach. The next-largest proportion of cases start in the small intestine, followed by the omentum and peritoneum [employer]. Genetics Home Reference. Gastrointestinal stromal tumor. Accessed June 2015). GISTs may develop in individuals with pathogenic variants in [facility] (PMID: 66440347, 42595638, 75643329).  Carney-Stratakis syndrome:  Carney-Stratakis syndrome (CSS) is an autosomal dominant condition that can be caused by pathogenic variants in [facility] (PMID: 51884166). This rare disorder is characterized by the development of PGL, GIST, or both (PMID: 06301601, 09323557, 32202542, 70623762, 83151761). CSS is very similar to but distinctly different from [place], a typically sporadic condition associated with pulmonary chondromas, GIST, and PGL (PMID: 60737106, 26948546).  Inheritance:  Pathogenic variants in SDHA resulting in GIST, PGL, and/or [place] have autosomal dominant inheritance. This means that an individual with a pathogenic variant has a 50% chance of passing the condition on to their offspring. With this result, it is now possible to identify at-risk relatives who can pursue testing for this specific familial variant. Many cases are inherited from a parent, but some cases can occur spontaneously (i.e., an individual with a pathogenic variant has parents who do not have it).  Individuals with a  pathogenic variant in SDHA are also carriers of mitochondrial complex II deficiency syndrome. This autosomal recessive condition is characterized by neurodegeneration and encephalomyopathy (PMID: 27035009; MedGen UID: 381829). For there to be a risk of this condition in offspring, both parents each have to have a pathogenic variant in [facility]; in such a case, the risk of having an affected child is 25%.  Management:  It is suggested that individuals with hereditary paraganglioma-pheochromocytoma syndrome, along with their at-risk relatives, have regular clinical monitoring by a physician or medical team with expertise in the treatment of hereditary GIST and PGL/PCC syndromes. A consultation with an endocrine surgeon, endocrinologist, and otolaryngologist is also recommended to establish an individualized care plan (PMID: 93716967).  Screening typically begins between six and eight years of age. There is currently no clear consensus regarding a screening and surveillance protocol for individuals with pathogenic SDHA variants; however, lifelong annual biochemical and clinical surveillance has been suggested (PMID: 89381017, 51025852, 77824235, 36144315):      1. Physical exam and blood pressure at the time of diagnosis and then every 6-12 months  2. 24-hour urinary excretion of fractionated metanephrines and catecholamines and/or plasma fractionated metanephrines at least annually to detect metastatic disease, tumor recurrence, or development of additional tumors ? Follow up with imaging by CT, MRI, 123I-MIBG (metaiodobenzylguanidine) scintigraphy, or FDG-PET if the fractionated metanephrine and/or  catecholamine levels become elevated, or if the original tumor had minimal or no catecholamine/fractionated metanephrine excess 3. Periodic imaging inclusive of head, neck, thorax, abdomen, and pelvic areas (once every 2 years) ? In order to minimize radiation exposure, MRI may be the preferable imaging modality with  CT and nuclear imaging reserved to further characterize detected tumors ? Imaging modalities should be at the discretion of the managing provider due to conflicting data regarding the utility and efficacy of the various options (PMID: 94765465) 4. Evaluation of cases with extra-adrenal sympathetic PGL and [place] for blood pressure elevations, tachycardia, and other signs and symptoms of catecholamine hypersecretion  5. Consideration of evaluation for GISTs in individuals (especially children, adolescents, or young adults) who have unexplained gastrointestinal symptoms (e.g., abdominal pain, upper gastrointestinal bleeding, nausea, vomiting, difficulty swallowing) or who experience unexplained intestinal obstruction or anemia 6. Medical genetics consultation  It is important to note that individuals with a pathogenic SDHA variant may be at a greater risk of developing PGL or [place] when living in higher altitudes or when chronically exposed to hypoxic conditions. In general, inactivating mutations in one of the succinate dehydrogenase genes, which includes SDHA, can lead to accumulation of succinate, the formation of reactive oxygen species, and the activation of hypoxia-dependent pathways (PMID: 03546568). Avoidance of living at high altitudes and activities that promote long-term exposure to hypoxia and predispose to chronic lung disease (e.g., smoking) is therefore encouraged (PMID: 12751700).  Syndromes associated with a predisposition to PGLs and PCCs may be associated with high morbidity and significant complications, which can lead to decreased lifespan and quality of life. As a result, early screening and therapeutic interventions are imperative. However, the natural history of hereditary paraganglioma-pheochromocytoma syndrome is variable and continues to evolve. This can often result in significant uncertainty regarding long-term prognosis. Targeted genetic counseling may help patients cope with this  diagnosis while keeping them an active participant in the management of their condition (PMID: 17494496). In addition, the clinical manifestations of PGLs and PCCs are broad; many symptoms can mimic minor ailments such as headaches and palpitations. Once a pathogenic variant has been identified, patients should be encouraged to have a low threshold for contacting their healthcare provider for further evaluation of unusual symptoms (PMID: 75916384).  An individual's cancer risk and medical management are not determined by genetic test results alone. Overall cancer risk assessment incorporates additional factors, including personal medical history, family history, and any available genetic information that may result in a personalized plan for cancer prevention and surveillance.  Knowing if a pathogenic variant in [place] is present is advantageous. At-risk relatives can be identified, enabling pursuit of a diagnostic evaluation. Further, the available information regarding hereditary cancer susceptibility genes is constantly evolving and more clinically relevant data regarding SDHA are likely to become available in the near future. Awareness of this cancer predisposition encourages patients and their providers to inform at-risk family members, to diligently follow condition-specific screening protocols, and to be vigilant in maintaining close and regular contact with their local genetics clinic in anticipation of new information.  BREAST CANCER RISK:  Based on [name] [name] personal and family history, as well as her genetic test results, a statistical model (Tyrer-Cuzick) was used to estimate her risk of developing breast cancer. Tyrer-Cuzick estimates @HIS @ lifetime risk of developing breast cancer to be approximately 31.9%. This lifetime breast cancer risk is a preliminary estimate based on available information using one of several models endorsed by the [place] (ACS). The ACS recommends  consideration of breast MRI screening as an adjunct to mammography for patients at high risk (defined as 20% or greater lifetime risk). A more detailed breast cancer risk assessment can be considered, if clinically indicated. This risk estimate can change over time and that this calculation may be repeated to reflect new information in her personal or family history in the future.  Ms. Starke has been determined to be at high risk for breast cancer. Therefore, we recommend that she consider having annual breast MRIs in addition to annual mammograms beginning at age 5 (or 10 years before the earliest diagnosis of breast cancer in the family). Ms. Stoermer should discuss her individual situation with a primary care physician and determine a breast cancer screening plan with which they are both comfortable.     FAMILY MEMBERS:  It is important that all of [name] [name] relatives (both men and women) know of the presence of this gene mutation. Site-specific genetic testing can sort out who in the family is at risk and who is not.  New mutations can occur in an individual such that neither parent is affected.  These mutations are referred to as de novo mutations.    Ms. Lemaire's children and siblings have a 50% chance to have inherited this mutation. We recommend they have genetic testing for this same mutation, as identifying the presence of this mutation would allow them to also take advantage of risk-reducing measures.  Mutations in the [place] gene have been seen in a small number of individuals with Leigh syndrome, a progressive brain disorder that typically appears in infancy or early childhood.  It is not known, however, how mutations in the [place] gene are related to the specific features of Leigh Syndrome.  PLAN: Ms. 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    Ms. Behrmann does not have an endocrinologist to perform follow up for the diagnosis with an SDHA mutation.  We  have suggested that she make an appointment with Dr. Delrae Rend. She may contact his office at [phone]. This note will be copied to that practice in order to set up the appropriate follow up.  SUPPORT RESOURCES:   The [employer] at www.pheopara.org is a TEFL teacher for both physicians and patients dealing with neuroendocrine diseases.   Ms. Mailhot also expressed financial concerns regarding follow-up and screening for this SDHA mutation. We encouraged her to discuss financial assistance options with Dr. Cindra Eves office, as they do offer discounts for uninsured patients and payment plans. Additionally, some organizations may provide financial assistance to qualifying patients with certain conditions (e.g. the PAN foundation). It may be reasonable to reach out to these organizations to see if they will help cover medical expenses associated with Hereditary Paraganglioma and Pheochromocytoma syndrome.   We encouraged Ms. Albee to remain in contact with [place] on an annual basis so we can update her personal and family histories, and let her know of advances in cancer genetics that may benefit the family. Our contact number was provided. Ms. Petite's questions were answered to her satisfaction today, and she knows she is welcome to call anytime with additional questions.    Clint Guy, MS, [facility] Licensed, Certified Genetic Counselor Email:  [email or web address] Phone:  [phone]

## 2020-09-11 ENCOUNTER — Other Ambulatory Visit: Payer: Self-pay

## 2020-09-11 ENCOUNTER — Ambulatory Visit: Payer: Self-pay | Admitting: *Deleted

## 2020-09-11 ENCOUNTER — Other Ambulatory Visit: Payer: Self-pay | Admitting: Obstetrics and Gynecology

## 2020-09-11 VITALS — BP 106/72 | Wt 164.4 lb

## 2020-09-11 DIAGNOSIS — Z09 Encounter for follow-up examination after completed treatment for conditions other than malignant neoplasm: Secondary | ICD-10-CM

## 2020-09-11 DIAGNOSIS — N6321 Unspecified lump in the left breast, upper outer quadrant: Secondary | ICD-10-CM

## 2020-09-11 DIAGNOSIS — N6320 Unspecified lump in the left breast, unspecified quadrant: Secondary | ICD-10-CM

## 2020-09-11 DIAGNOSIS — N644 Mastodynia: Secondary | ICD-10-CM

## 2020-09-11 DIAGNOSIS — M79622 Pain in left upper arm: Secondary | ICD-10-CM

## 2020-09-11 DIAGNOSIS — Z1239 Encounter for other screening for malignant neoplasm of breast: Secondary | ICD-10-CM

## 2020-09-11 NOTE — Progress Notes (Signed)
[name] is a 35 y.o. female who presents to [facility] clinic today with complaint of left breast lump x one month that is painful. Patient states the pain comes and goes. Patient rates the pain at a 6 out of 10. .    Pap Smear: Pap smear not completed today. Last Pap smear was in April 2021 at [facility] clinic and was normal per patient. Patient has history of an abnormal Pap smear 02/19/2011 that was LSIL that a colposcopy was completed for follow-up 12/19/2011 that was benign. Per patient has had at least three normal Pap smears since colposcopy 12/19/2011, 03/06/2014, and 11/17/2017. Last Pap smear result is not available in Epic.   Physical exam: Breasts Breasts symmetrical. No skin abnormalities bilateral breasts. No nipple retraction bilateral breasts. No nipple discharge bilateral breasts. No lymphadenopathy. No lumps palpated right breast. Palpated a lump within the left breast at 1 o'clock 15 cm from the nipple. Complaints of left outer breat tenderness on exam.  MS DIGITAL DIAG TOMO BILAT  Result Date: 11/17/2017 CLINICAL DATA:  Focal pain in the LATERAL aspect of the LEFT breast. The patient has a strong family history of breast cancer. Her mother was diagnosed at age 75 and died at age 4 due to breast cancer. Her maternal aunts and maternal grandmother was diagnosed with breast cancer. EXAM: DIGITAL DIAGNOSTIC BILATERAL MAMMOGRAM WITH CAD AND TOMO ULTRASOUND BILATERAL BREAST COMPARISON:  Baseline exam ACR Breast Density Category c: The breast tissue is heterogeneously dense, which may obscure small masses. FINDINGS: Right breast: Mammogram: There is a partially obscured oval mass in the MEDIAL aspect of the RIGHT breast and further evaluated sonographically. Elsewhere in the RIGHT breast, no distortion or suspicious microcalcifications. Mammographic images were processed with CAD. Physical Exam: I palpate no abnormality in the MEDIAL aspect of the RIGHT breast.  Ultrasound: Targeted ultrasound is performed, showing a mixed echogenicity mass with oval shape an irregular margins in the 3 o'clock location of the RIGHT breast 7 centimeters from the nipple which measures 2.3 x 1.6 x 1.1 centimeters. There is associated blood flow on Doppler evaluation. Evaluation of the RIGHT axilla is negative for adenopathy. Left breast: Mammogram: There is a focal area of possible distortion in the LATERAL portion of the LEFT breast, deep to the area of patient's concern which is marked with a BB. Mammographic images were processed with CAD. Physical Exam: I palpate no abnormality in the LATERAL portion of the LEFT breast. The patient is tender on examination of the LATERAL portion of the LEFT breast. Ultrasound: Targeted ultrasound is performed, showing extremely dense fibroglandular tissue throughout the LATERAL aspect of the LEFT breast. No acoustic shadowing or discrete mass identified. Evaluation of the LEFT axilla is negative for adenopathy. IMPRESSION: 1. Indeterminate mixed echogenicity mass in the 3 o'clock location of the RIGHT breast. No RIGHT axillary adenopathy. 2. Asymmetry/possible distortion in the LATERAL portion of the LEFT breast without sonographic correlate. No LEFT axillary adenopathy. RECOMMENDATION: 1. Ultrasound-guided core biopsy of mass in the 3 o'clock location of the RIGHT breast. 2. Study guided core biopsy of asymmetry/possible distortion in the LATERAL portion of the LEFT breast. I have discussed the findings and recommendations with the patient. Results were also provided in writing at the conclusion of the visit. If applicable, a reminder letter will be sent to the patient regarding the next appointment. BI-RADS CATEGORY  4: Suspicious. Electronically Signed   By: Nolon Nations M.D.   On: 11/17/2017 15:28  MM CLIP PLACEMENT LEFT  Result Date: 11/23/2017 CLINICAL DATA:  Status post stereotactic guided biopsy left breast asymmetry. EXAM: DIAGNOSTIC  LEFT MAMMOGRAM POST STEREOTACTIC BIOPSY COMPARISON:  Previous exam(s). FINDINGS: Mammographic images were obtained following stereotactic guided biopsy of left breast asymmetry. Coil shaped marking clip in appropriate position. IMPRESSION: Appropriate position coil shaped marking clip status post stereotactic guided biopsy left breast asymmetry. Final Assessment: Post Procedure Mammograms for Marker Placement Electronically Signed   By: Lovey Newcomer M.D.   On: 11/23/2017 14:56   MM CLIP PLACEMENT RIGHT  Result Date: 11/23/2017 CLINICAL DATA:  Patient with indeterminate right breast mass EXAM: DIAGNOSTIC RIGHT MAMMOGRAM POST ULTRASOUND BIOPSY COMPARISON:  Previous exam(s). FINDINGS: Mammographic images were obtained following ultrasound guided biopsy of right breast mass 3 o'clock position. Ribbon shaped marking clip in appropriate position. IMPRESSION: Appropriate position ribbon shaped marking clip status post ultrasound-guided biopsy right breast mass. Final Assessment: Post Procedure Mammograms for Marker Placement Electronically Signed   By: Lovey Newcomer M.D.   On: 11/23/2017 14:57   MM LT BREAST BX W LOC DEV 1ST LESION IMAGE BX SPEC STEREO GUIDE  Result Date: 11/23/2017 CLINICAL DATA:  Patient with left breast asymmetry and possible distortion. EXAM: LEFT BREAST STEREOTACTIC CORE NEEDLE BIOPSY COMPARISON:  Previous exams. FINDINGS: The patient and I discussed the procedure of stereotactic-guided biopsy including benefits and alternatives. We discussed the high likelihood of a successful procedure. We discussed the risks of the procedure including infection, bleeding, tissue injury, clip migration, and inadequate sampling. Informed written consent was given. The usual time out protocol was performed immediately prior to the procedure. Using sterile technique and 1% Lidocaine as local anesthetic, under stereotactic guidance, a 9 gauge vacuum assisted device was used to perform core needle biopsy of  asymmetry within the lateral left breast using a cranial approach. Lesion quadrant: Upper outer quadrant At the conclusion of the procedure, a coil shaped tissue marker clip was deployed into the biopsy cavity. Follow-up 2-view mammogram was performed and dictated separately. IMPRESSION: Stereotactic-guided biopsy of left breast asymmetry. No apparent complications. Electronically Signed   By: Lovey Newcomer M.D.   On: 11/23/2017 14:55        Pelvic/Bimanual Pap is not indicated today per BCCCP guidelines.   Smoking History: Patient has never smoked.   Patient Navigation: Patient education provided. Access to services provided for patient through BCCCP program.    Breast and Cervical Cancer Risk Assessment: Patient has family history of mother, maternal grandmother, and maternal aunt having breast cancer. Patient has known genetic mutations. Patient has no history of radiation treatment to the chest before age 60. Patient has history of cervical dysplasia. Patient has no history of being immunocompromised or DES exposure in-utero.  Risk Assessment     Risk Scores       09/11/2020 11/17/2017   Last edited by: Royston Bake, CMA Rolena Infante H, LPN   5-year risk: 0.5 %    Lifetime risk: 17.6 %            A: BCCCP exam without pap smear Complaint of left breast lump and pain.  P: Referred patient to the [facility] for a diagnostic mammogram. Appointment scheduled Tuesday, September 25, 2020 at [place].  Loletta Parish, RN 09/11/2020 2:14 PM

## 2020-09-11 NOTE — Patient Instructions (Signed)
Explained breast self awareness with [place]. Patient did not need a Pap smear today due to last Pap smear was in April 2021 per patient. Let her know BCCCP will cover Pap smears every 3 years unless has a history of abnormal Pap smears. Referred patient to the [facility] for a diagnostic mammogram. Appointment scheduled Tuesday, September 25, 2020 at [place]. Patient aware of appointment and will be there. [place] verbalized understanding.  Drago Hammonds, [name], RN 2:14 PM

## 2020-09-25 ENCOUNTER — Ambulatory Visit: Payer: Medicaid Other

## 2020-09-25 ENCOUNTER — Other Ambulatory Visit: Payer: Self-pay | Admitting: Obstetrics and Gynecology

## 2020-09-25 ENCOUNTER — Other Ambulatory Visit: Payer: Self-pay

## 2020-09-25 ENCOUNTER — Ambulatory Visit
Admission: RE | Admit: 2020-09-25 | Discharge: 2020-09-25 | Disposition: A | Discharge status: Home / Self Care | Payer: No Typology Code available for payment source | Source: Ambulatory Visit | Attending: Obstetrics and Gynecology | Admitting: Obstetrics and Gynecology

## 2020-09-25 ENCOUNTER — Ambulatory Visit
Admission: RE | Admit: 2020-09-25 | Discharge: 2020-09-25 | Disposition: A | Discharge status: Home / Self Care | Payer: Medicaid Other | Source: Ambulatory Visit | Attending: Obstetrics and Gynecology | Admitting: Obstetrics and Gynecology

## 2020-09-25 DIAGNOSIS — M79622 Pain in left upper arm: Secondary | ICD-10-CM

## 2020-09-25 DIAGNOSIS — N6320 Unspecified lump in the left breast, unspecified quadrant: Secondary | ICD-10-CM

## 2021-08-14 ENCOUNTER — Encounter (HOSPITAL_COMMUNITY): Payer: Self-pay | Admitting: Obstetrics & Gynecology

## 2021-08-14 ENCOUNTER — Other Ambulatory Visit: Payer: Self-pay

## 2021-08-14 ENCOUNTER — Inpatient Hospital Stay (HOSPITAL_COMMUNITY)
Admission: AD | Admit: 2021-08-14 | Discharge: 2021-08-15 | Disposition: A | Discharge status: Home / Self Care | Payer: Commercial Managed Care - HMO | Attending: Obstetrics & Gynecology | Admitting: Obstetrics & Gynecology

## 2021-08-14 DIAGNOSIS — Z8616 Personal history of COVID-19: Secondary | ICD-10-CM | POA: Diagnosis not present

## 2021-08-14 DIAGNOSIS — O26891 Other specified pregnancy related conditions, first trimester: Secondary | ICD-10-CM | POA: Diagnosis not present

## 2021-08-14 DIAGNOSIS — F419 Anxiety disorder, unspecified: Secondary | ICD-10-CM | POA: Insufficient documentation

## 2021-08-14 DIAGNOSIS — Z3A01 Less than 8 weeks gestation of pregnancy: Secondary | ICD-10-CM | POA: Diagnosis present

## 2021-08-14 DIAGNOSIS — O219 Vomiting of pregnancy, unspecified: Secondary | ICD-10-CM | POA: Insufficient documentation

## 2021-08-14 DIAGNOSIS — O99351 Diseases of the nervous system complicating pregnancy, first trimester: Secondary | ICD-10-CM | POA: Diagnosis not present

## 2021-08-14 DIAGNOSIS — L299 Pruritus, unspecified: Secondary | ICD-10-CM | POA: Insufficient documentation

## 2021-08-14 DIAGNOSIS — O99341 Other mental disorders complicating pregnancy, first trimester: Secondary | ICD-10-CM | POA: Insufficient documentation

## 2021-08-14 DIAGNOSIS — Z6791 Unspecified blood type, Rh negative: Secondary | ICD-10-CM | POA: Diagnosis not present

## 2021-08-14 DIAGNOSIS — O09521 Supervision of elderly multigravida, first trimester: Secondary | ICD-10-CM | POA: Insufficient documentation

## 2021-08-14 DIAGNOSIS — O99611 Diseases of the digestive system complicating pregnancy, first trimester: Secondary | ICD-10-CM | POA: Diagnosis not present

## 2021-08-14 DIAGNOSIS — R6883 Chills (without fever): Secondary | ICD-10-CM | POA: Diagnosis not present

## 2021-08-14 LAB — CBC
HCT: 40.1 % (ref 36.0–46.0)
Hemoglobin: 13 g/dL (ref 12.0–15.0)
MCH: 28.1 pg (ref 26.0–34.0)
MCHC: 32.4 g/dL (ref 30.0–36.0)
MCV: 86.8 fL (ref 80.0–100.0)
Platelets: 250 10*3/uL (ref 150–400)
RBC: 4.62 MIL/uL (ref 3.87–5.11)
RDW: 12.8 % (ref 11.5–15.5)
WBC: 8.5 10*3/uL (ref 4.0–10.5)
nRBC: 0 % (ref 0.0–0.2)

## 2021-08-14 LAB — COMPREHENSIVE METABOLIC PANEL
ALT: 9 U/L (ref 0–44)
AST: 13 U/L — ABNORMAL LOW (ref 15–41)
Albumin: 3.8 g/dL (ref 3.5–5.0)
Alkaline Phosphatase: 53 U/L (ref 38–126)
Anion gap: 9 (ref 5–15)
BUN: 7 mg/dL (ref 6–20)
CO2: 21 mmol/L — ABNORMAL LOW (ref 22–32)
Calcium: 9 mg/dL (ref 8.9–10.3)
Chloride: 106 mmol/L (ref 98–111)
Creatinine, Ser: 0.6 mg/dL (ref 0.44–1.00)
GFR, Estimated: >60 mL/min (ref >60)
Glucose, Bld: 89 mg/dL (ref 70–99)
Potassium: 3.7 mmol/L (ref 3.5–5.1)
Sodium: 136 mmol/L (ref 135–145)
Total Bilirubin: 1.1 mg/dL (ref 0.3–1.2)
Total Protein: 7.6 g/dL (ref 6.5–8.1)

## 2021-08-14 LAB — URINALYSIS, ROUTINE W REFLEX MICROSCOPIC
Bilirubin Urine: NEGATIVE
Glucose, UA: NEGATIVE mg/dL
Hgb urine dipstick: NEGATIVE
Ketones, ur: NEGATIVE mg/dL
Nitrite: NEGATIVE
Protein, ur: NEGATIVE mg/dL
Specific Gravity, Urine: 1.027 (ref 1.005–1.030)
pH: 5 (ref 5.0–8.0)

## 2021-08-14 LAB — POCT PREGNANCY, URINE: Preg Test, Ur: POSITIVE — AB

## 2021-08-14 MED ORDER — FAMOTIDINE IN NACL 20-0.9 MG/50ML-% IV SOLN
20.0000 mg | Freq: Once | INTRAVENOUS | Status: AC
  Administered 2021-08-14: 20 mg via INTRAVENOUS
  Filled 2021-08-14: qty 50

## 2021-08-14 MED ORDER — SODIUM CHLORIDE 0.9 % IV SOLN
12.5000 mg | Freq: Once | INTRAVENOUS | Status: AC
  Administered 2021-08-14: 12.5 mg via INTRAVENOUS
  Filled 2021-08-14: qty 0.5

## 2021-08-14 MED ORDER — LACTATED RINGERS IV BOLUS
1000.0000 mL | Freq: Once | INTRAVENOUS | Status: AC
  Administered 2021-08-14: 1000 mL via INTRAVENOUS

## 2021-08-14 NOTE — MAU Note (Signed)
..  [name] [name] is a 36 y.o. at Unknown here in MAU reporting: Had a positive home pregnancy test a few days ago. She has been vomiting for about a week, vomits every time she tries to eat something. Feels weak, is cold, and has a headache. Has a sharp pain on her left side, the pain comes and goes.  Denies vaginal bleeding or lower abdominal cramping.  LMP: 07/08/2021 Onset of complaint: a week ago Pain score: 8/10 Vitals:   08/14/21 2055  BP: 120/79  Pulse: 69  Resp: 16  Temp: 97.9 F (36.6 C)  SpO2: 100%      Lab orders placed from triage: POCT preg

## 2021-08-14 NOTE — MAU Provider Note (Signed)
Chief Complaint: Emesis and Nausea   Event Date/Time   First Provider Initiated Contact with Patient 08/14/21 2250      SUBJECTIVE HPI: [name] [name] is a 36 y.o. [phone] at [id number] LMP who presents to maternity admissions reporting nausea and vomiting x 1 week with onset of generalized itching in last 2-3 days and feeling cold all the time and dizzy. She denies any lower abdominal pain.  She reports vomiting 7-8 times in 24 hours.  She cannot keep anything down.  She has itching all over her body but no visible rash. She has not tried any treatments. There are no other symptoms.      HPI  Past Medical History:  Diagnosis Date   Anemia 2009   Anxiety    Back pain 2010   COVID-19    Exposure to x-rays 2012   normal   Family history of breast cancer    Family history of uterine cancer    H. pylori infection 2019   H/O candidiasis    H/O varicella    History of gastroesophageal reflux (GERD) 2010   taking with tums, helps    Hypotension    Migraines 2009   Sleep apnea 2012   Past Surgical History:  Procedure Laterality Date   COLPOSCOPY  04/02/2011   NO PAST SURGERIES     Social History   Socioeconomic History   Marital status: Married    Spouse name: Not on file   Number of children: Not on file   Years of education: Not on file   Highest education level: Not on file  Occupational History   Occupation: unemployed   Tobacco Use   Smoking status: Never   Smokeless tobacco: Never  Vaping Use   Vaping Use: Never used  Substance and Sexual Activity   Alcohol use: No   Drug use: No   Sexual activity: Not Currently    Birth control/protection: None  Other Topics Concern   Not on file  Social History Narrative   [place].   Married, husband age 36   Muslim-no pork, needs female provider.    Mom is actively involved with patient. She has been here for 25 years.    Social Determinants of Health   Financial Resource Strain:  Not on file  Food Insecurity: No Food Insecurity (09/11/2020)   Hunger Vital Sign    Worried About Running Out of Food in the Last Year: Never true    Ran Out of Food in the Last Year: Never true  Transportation Needs: No Transportation Needs (09/11/2020)   PRAPARE - THydrologist(Medical): No    Lack of Transportation (Non-Medical): No  Physical Activity: Not on file  Stress: Not on file  Social Connections: Not on file  Intimate Partner Violence: Not on file   No current facility-administered medications on file prior to encounter.   Current Outpatient Medications on File Prior to Encounter  Medication Sig Dispense Refill   acetaminophen (TYLENOL) 650 MG suppository Place 650 mg rectally every 4 (four) hours as needed. (Patient not taking: Reported on 09/11/2020)     ferrous sulfate 325 (65 FE) MG EC tablet Take 325 mg by mouth 3 (three) times daily with meals.     sertraline (ZOLOFT) 50 MG tablet Take 1 tablet (50 mg total) by mouth at bedtime. 30 tablet 2   Allergies  Allergen Reactions   Fentanyl Hives  Reports severe itching with red rash after IV Fentanyl.    Other Anxiety and Rash    Medication for sleep given while inpatient; doesn't remember name, no matching medications listed on chart history.  Possibly ambien, lunesta, benadryl, doxylamine.    ROS:  Review of Systems  Constitutional:  Positive for chills. Negative for fatigue and fever.  Respiratory:  Negative for shortness of breath.   Cardiovascular:  Negative for chest pain.  Gastrointestinal:  Positive for nausea and vomiting. Negative for constipation.  Genitourinary:  Negative for difficulty urinating, dysuria, flank pain, pelvic pain, vaginal bleeding, vaginal discharge and vaginal pain.  Skin:        Generalized pruritis   Neurological:  Negative for dizziness and headaches.  Psychiatric/Behavioral: Negative.       I have reviewed patient's Past Medical Hx, Surgical Hx, Family  Hx, Social Hx, medications and allergies.   Physical Exam  Patient Vitals for the past 24 hrs:  BP Temp Temp src Pulse Resp SpO2 Height Weight  08/15/21 0049 100/61 -- -- 64 -- -- -- --  08/14/21 2055 120/79 97.9 F (36.6 C) Oral 69 16 100 % '5\' 3"'$  (1.6 m) 71.2 kg   Constitutional: Well-developed, well-nourished female in mild distress.  Cardiovascular: normal rate Respiratory: normal effort GI: Abd soft, non-tender. Pos BS x 4 MS: Extremities nontender, no edema, normal ROM Neurologic: Alert and oriented x 4.  GU: Neg CVAT.  PELVIC EXAM: Deferred   LAB RESULTS Results for orders placed or performed during the hospital encounter of 08/14/21 (from the past 24 hour(s))  Pregnancy, urine POC     Status: Abnormal   Collection Time: 08/14/21  9:01 PM  Result Value Ref Range   Preg Test, Ur POSITIVE (A) NEGATIVE  Urinalysis, Routine w reflex microscopic Urine, Clean Catch     Status: Abnormal   Collection Time: 08/14/21  9:04 PM  Result Value Ref Range   Color, Urine YELLOW YELLOW   APPearance HAZY (A) CLEAR   Specific Gravity, Urine 1.027 1.005 - 1.030   pH 5.0 5.0 - 8.0   Glucose, UA NEGATIVE NEGATIVE mg/dL   Hgb urine dipstick NEGATIVE NEGATIVE   Bilirubin Urine NEGATIVE NEGATIVE   Ketones, ur NEGATIVE NEGATIVE mg/dL   Protein, ur NEGATIVE NEGATIVE mg/dL   Nitrite NEGATIVE NEGATIVE   Leukocytes,Ua TRACE (A) NEGATIVE   RBC / HPF 0-5 0 - 5 RBC/hpf   WBC, UA 0-5 0 - 5 WBC/hpf   Bacteria, UA RARE (A) NONE SEEN   Squamous Epithelial / LPF 0-5 0 - 5   Mucus PRESENT   CBC     Status: None   Collection Time: 08/14/21 11:10 PM  Result Value Ref Range   WBC 8.5 4.0 - 10.5 K/uL   RBC 4.62 3.87 - 5.11 MIL/uL   Hemoglobin 13.0 12.0 - 15.0 g/dL   HCT 40.1 36.0 - 46.0 %   MCV 86.8 80.0 - 100.0 fL   MCH 28.1 26.0 - 34.0 pg   MCHC 32.4 30.0 - 36.0 g/dL   RDW 12.8 11.5 - 15.5 %   Platelets 250 150 - 400 K/uL   nRBC 0.0 0.0 - 0.2 %  Comprehensive metabolic panel     Status:  Abnormal   Collection Time: 08/14/21 11:10 PM  Result Value Ref Range   Sodium 136 135 - 145 mmol/L   Potassium 3.7 3.5 - 5.1 mmol/L   Chloride 106 98 - 111 mmol/L   CO2 21 (L) 22 - 32 mmol/L  Glucose, Bld 89 70 - 99 mg/dL   BUN 7 6 - 20 mg/dL   Creatinine, Ser 0.60 0.44 - 1.00 mg/dL   Calcium 9.0 8.9 - 10.3 mg/dL   Total Protein 7.6 6.5 - 8.1 g/dL   Albumin 3.8 3.5 - 5.0 g/dL   AST 13 (L) 15 - 41 U/L   ALT 9 0 - 44 U/L   Alkaline Phosphatase 53 38 - 126 U/L   Total Bilirubin 1.1 0.3 - 1.2 mg/dL   GFR, Estimated >60 >60 mL/min   Anion gap 9 5 - 15       IMAGING No results found.  MAU Management/MDM: Orders Placed This Encounter  Procedures   Urinalysis, Routine w reflex microscopic Urine, Clean Catch   CBC   Comprehensive metabolic panel   Bile acids, total   Pregnancy, urine POC   Discharge patient    Meds ordered this encounter  Medications   lactated ringers bolus 1,000 mL   promethazine (PHENERGAN) 12.5 mg in sodium chloride 0.9 % 50 mL IVPB   famotidine (PEPCID) IVPB 20 mg premix   metoCLOPramide (REGLAN) 10 MG tablet    Sig: Take 1 tablet (10 mg total) by mouth 3 (three) times daily with meals as needed for nausea.    Dispense:  60 tablet    Refill:  2    Order Specific Question:   Supervising Provider    Answer:   Donnamae Jude [[id number]]   promethazine (PHENERGAN) 12.5 MG tablet    Sig: Take 1-2 tablets (12.5-25 mg total) by mouth every 6 (six) hours as needed for nausea or vomiting.    Dispense:  30 tablet    Refill:  2    Order Specific Question:   Supervising Provider    Answer:   Donnamae Jude [[id number]]   famotidine (PEPCID) 20 MG tablet    Sig: Take 1 tablet (20 mg total) by mouth 2 (two) times daily.    Dispense:  60 tablet    Refill:  2    Order Specific Question:   Supervising Provider    Answer:   Donnamae Jude [[id number]]    No ketones in urine but pt pale with dry mucus membranes, weak and reporting dizziness and other symptoms including new  onset generalized itching.  IV fluids and antiemetics given and CBC, CMP, bile acids pending.  Pt reports significant improvement in symptoms. Rx for Reglan TID at mealtimes PRN and Phenergan 12.5-25 mg daily.  Added Pepcid 20 mg BID Q HS.  Keep scheduled prenatal appts, return to MAU as needed for emergencies.    ASSESSMENT 1. Nausea and vomiting during pregnancy prior to [redacted] weeks gestation   2. Pruritus   3. Rh negative state in antepartum period, first trimester   4. Chills   5. [redacted] weeks gestation of pregnancy     PLAN Discharge home Allergies as of 08/15/2021       Reactions   Fentanyl Hives   Reports severe itching with red rash after IV Fentanyl.    Other Anxiety, Rash   Medication for sleep given while inpatient; doesn't remember name, no matching medications listed on chart history.  Possibly ambien, lunesta, benadryl, doxylamine.        Medication List     STOP taking these medications    B-12 1000 MCG Subl   nystatin cream Commonly known as: MYCOSTATIN   triamcinolone cream 0.1 % Commonly known as: KENALOG       TAKE  these medications    acetaminophen 650 MG suppository Commonly known as: TYLENOL Place 650 mg rectally every 4 (four) hours as needed.   famotidine 20 MG tablet Commonly known as: Pepcid Take 1 tablet (20 mg total) by mouth 2 (two) times daily.   ferrous sulfate 325 (65 FE) MG EC tablet Take 325 mg by mouth 3 (three) times daily with meals.   metoCLOPramide 10 MG tablet Commonly known as: Reglan Take 1 tablet (10 mg total) by mouth 3 (three) times daily with meals as needed for nausea.   promethazine 12.5 MG tablet Commonly known as: PHENERGAN Take 1-2 tablets (12.5-25 mg total) by mouth every 6 (six) hours as needed for nausea or vomiting.   sertraline 50 MG tablet Commonly known as: ZOLOFT Take 1 tablet (50 mg total) by mouth at bedtime.        Follow-up Information     [facility] Follow up.   Why:  As scheduled Contact information: [address] [place] [id number] [facility] Follow up.   Specialty: Obstetrics and Gynecology Why: As needed for emergencies Contact information: [address] [id number] [place] Certified Nurse-Midwife 08/15/2021  5:35 AM

## 2021-08-15 DIAGNOSIS — O26891 Other specified pregnancy related conditions, first trimester: Secondary | ICD-10-CM

## 2021-08-15 DIAGNOSIS — Z6791 Unspecified blood type, Rh negative: Secondary | ICD-10-CM | POA: Diagnosis not present

## 2021-08-15 DIAGNOSIS — R6883 Chills (without fever): Secondary | ICD-10-CM

## 2021-08-15 DIAGNOSIS — O219 Vomiting of pregnancy, unspecified: Secondary | ICD-10-CM | POA: Diagnosis not present

## 2021-08-15 MED ORDER — FAMOTIDINE 20 MG PO TABS: 20.0000 mg | ORAL_TABLET | Freq: Two times a day (BID) | ORAL | 2 refills | Status: DC

## 2021-08-15 MED ORDER — PROMETHAZINE HCL 12.5 MG PO TABS: 12.5000 mg | ORAL_TABLET | Freq: Four times a day (QID) | ORAL | 2 refills | Status: DC | PRN

## 2021-08-15 MED ORDER — METOCLOPRAMIDE HCL 10 MG PO TABS: 10.0000 mg | ORAL_TABLET | Freq: Three times a day (TID) | ORAL | 2 refills | Status: DC | PRN

## 2021-08-16 LAB — BILE ACIDS, TOTAL: Bile Acids Total: 1.6 umol/L (ref 0.0–10.0)

## 2021-08-19 ENCOUNTER — Inpatient Hospital Stay (HOSPITAL_COMMUNITY)
Admission: AD | Admit: 2021-08-19 | Discharge: 2021-08-20 | Disposition: A | Discharge status: Home / Self Care | Payer: Commercial Managed Care - HMO | Attending: Obstetrics and Gynecology | Admitting: Obstetrics and Gynecology

## 2021-08-19 ENCOUNTER — Encounter (HOSPITAL_COMMUNITY): Payer: Self-pay | Admitting: Obstetrics and Gynecology

## 2021-08-19 DIAGNOSIS — O219 Vomiting of pregnancy, unspecified: Secondary | ICD-10-CM | POA: Insufficient documentation

## 2021-08-19 DIAGNOSIS — Z79899 Other long term (current) drug therapy: Secondary | ICD-10-CM | POA: Diagnosis not present

## 2021-08-19 DIAGNOSIS — Z3A01 Less than 8 weeks gestation of pregnancy: Secondary | ICD-10-CM | POA: Diagnosis not present

## 2021-08-19 LAB — CBC WITH DIFFERENTIAL/PLATELET
Abs Immature Granulocytes: 0.01 10*3/uL (ref 0.00–0.07)
Basophils Absolute: 0 10*3/uL (ref 0.0–0.1)
Basophils Relative: 0 %
Eosinophils Absolute: 0 10*3/uL (ref 0.0–0.5)
Eosinophils Relative: 0 %
HCT: 35.2 % — ABNORMAL LOW (ref 36.0–46.0)
Hemoglobin: 12 g/dL (ref 12.0–15.0)
Immature Granulocytes: 0 %
Lymphocytes Relative: 29 %
Lymphs Abs: 2.2 10*3/uL (ref 0.7–4.0)
MCH: 28.5 pg (ref 26.0–34.0)
MCHC: 34.1 g/dL (ref 30.0–36.0)
MCV: 83.6 fL (ref 80.0–100.0)
Monocytes Absolute: 0.4 10*3/uL (ref 0.1–1.0)
Monocytes Relative: 6 %
Neutro Abs: 4.8 10*3/uL (ref 1.7–7.7)
Neutrophils Relative %: 65 %
Platelets: 226 10*3/uL (ref 150–400)
RBC: 4.21 MIL/uL (ref 3.87–5.11)
RDW: 12.4 % (ref 11.5–15.5)
WBC: 7.5 10*3/uL (ref 4.0–10.5)
nRBC: 0 % (ref 0.0–0.2)

## 2021-08-19 LAB — COMPREHENSIVE METABOLIC PANEL
ALT: 7 U/L (ref 0–44)
AST: 11 U/L — ABNORMAL LOW (ref 15–41)
Albumin: 3.6 g/dL (ref 3.5–5.0)
Alkaline Phosphatase: 47 U/L (ref 38–126)
Anion gap: 7 (ref 5–15)
BUN: 7 mg/dL (ref 6–20)
CO2: 23 mmol/L (ref 22–32)
Calcium: 8.7 mg/dL — ABNORMAL LOW (ref 8.9–10.3)
Chloride: 105 mmol/L (ref 98–111)
Creatinine, Ser: 0.6 mg/dL (ref 0.44–1.00)
GFR, Estimated: >60 mL/min (ref >60)
Glucose, Bld: 92 mg/dL (ref 70–99)
Potassium: 3.5 mmol/L (ref 3.5–5.1)
Sodium: 135 mmol/L (ref 135–145)
Total Bilirubin: 1.1 mg/dL (ref 0.3–1.2)
Total Protein: 6.9 g/dL (ref 6.5–8.1)

## 2021-08-19 LAB — URINALYSIS, ROUTINE W REFLEX MICROSCOPIC
Bilirubin Urine: NEGATIVE
Glucose, UA: NEGATIVE mg/dL
Hgb urine dipstick: NEGATIVE
Ketones, ur: 80 mg/dL — AB
Nitrite: NEGATIVE
Protein, ur: 30 mg/dL — AB
Specific Gravity, Urine: 1.026 (ref 1.005–1.030)
pH: 7 (ref 5.0–8.0)

## 2021-08-19 MED ORDER — DEXAMETHASONE SODIUM PHOSPHATE 10 MG/ML IJ SOLN
10.0000 mg | Freq: Once | INTRAMUSCULAR | Status: AC
  Administered 2021-08-19: 10 mg via INTRAVENOUS
  Filled 2021-08-19: qty 1

## 2021-08-19 MED ORDER — LACTATED RINGERS IV BOLUS
1000.0000 mL | Freq: Once | INTRAVENOUS | Status: AC
  Administered 2021-08-19: 1000 mL via INTRAVENOUS

## 2021-08-19 MED ORDER — ONDANSETRON HCL 4 MG/2ML IJ SOLN
4.0000 mg | Freq: Once | INTRAMUSCULAR | Status: AC
  Administered 2021-08-19: 4 mg via INTRAVENOUS
  Filled 2021-08-19: qty 2

## 2021-08-19 MED ORDER — DIPHENHYDRAMINE HCL 50 MG/ML IJ SOLN
12.5000 mg | Freq: Once | INTRAMUSCULAR | Status: AC
  Administered 2021-08-19: 12.5 mg via INTRAVENOUS
  Filled 2021-08-19: qty 1

## 2021-08-19 MED ORDER — FAMOTIDINE IN NACL 20-0.9 MG/50ML-% IV SOLN
20.0000 mg | Freq: Once | INTRAVENOUS | Status: AC
  Administered 2021-08-19: 20 mg via INTRAVENOUS
  Filled 2021-08-19: qty 50

## 2021-08-19 NOTE — MAU Provider Note (Signed)
Event Date/Time   First Provider Initiated Contact with Patient 08/19/21 2154      S [name] is a 36 y.o. [phone] pregnant female at 6w0dwho presents to MAU today with complaint of continued nausea/vomiting since last visit on 08/14/21. Cannot keep down meds, food or liquids, has not kept anything down in the past 24hrs. Also reports upper abdominal pain, especially when vomiting. Denies vaginal bleeding or discharge, lower abdominal pain, urinary symptoms or diarrhea. No other physical complaints.  [facility].   Pertinent items noted in HPI and remainder of comprehensive ROS otherwise negative.   O BP 113/74 (BP Location: Left Arm)   Pulse 95   Temp 98.2 F (36.8 C) (Oral)   Resp 16   Ht '5\' 3"'$  (1.6 m)   Wt 156 lb 6.4 oz (70.9 kg)   LMP 07/08/2021   SpO2 98%   BMI 27.71 kg/m  Physical Exam Vitals and nursing note reviewed.  Constitutional:      Appearance: She is well-developed.  HENT:     Head: Normocephalic and atraumatic.  Eyes:     Pupils: Pupils are equal, round, and reactive to light.  Cardiovascular:     Rate and Rhythm: Normal rate and regular rhythm.  Pulmonary:     Effort: Pulmonary effort is normal.  Abdominal:     General: There is no distension.     Palpations: Abdomen is soft.     Tenderness: There is no abdominal tenderness.  Skin:    General: Skin is warm and dry.     Capillary Refill: Capillary refill takes less than 2 seconds.  Neurological:     Mental Status: She is alert and oriented to person, place, and time.  Psychiatric:        Mood and Affect: Mood normal.        Behavior: Behavior normal.    Results for orders placed or performed during the hospital encounter of 08/19/21 (from the past 24 hour(s))  Urinalysis, Routine w reflex microscopic Urine, Clean Catch     Status: Abnormal   Collection Time: 08/19/21  5:45 PM  Result Value Ref Range   Color, Urine YELLOW YELLOW    APPearance HAZY (A) CLEAR   Specific Gravity, Urine 1.026 1.005 - 1.030   pH 7.0 5.0 - 8.0   Glucose, UA NEGATIVE NEGATIVE mg/dL   Hgb urine dipstick NEGATIVE NEGATIVE   Bilirubin Urine NEGATIVE NEGATIVE   Ketones, ur 80 (A) NEGATIVE mg/dL   Protein, ur 30 (A) NEGATIVE mg/dL   Nitrite NEGATIVE NEGATIVE   Leukocytes,Ua SMALL (A) NEGATIVE   RBC / HPF 6-10 0 - 5 RBC/hpf   WBC, UA 11-20 0 - 5 WBC/hpf   Bacteria, UA RARE (A) NONE SEEN   Squamous Epithelial / LPF 6-10 0 - 5   Mucus PRESENT   CBC with Differential/Platelet     Status: Abnormal   Collection Time: 08/19/21 10:21 PM  Result Value Ref Range   WBC 7.5 4.0 - 10.5 K/uL   RBC 4.21 3.87 - 5.11 MIL/uL   Hemoglobin 12.0 12.0 - 15.0 g/dL   HCT 35.2 (L) 36.0 - 46.0 %   MCV 83.6 80.0 - 100.0 fL   MCH 28.5 26.0 - 34.0 pg   MCHC 34.1 30.0 - 36.0 g/dL   RDW 12.4 11.5 - 15.5 %   Platelets 226 150 - 400 K/uL   nRBC 0.0 0.0 - 0.2 %   Neutrophils  Relative % 65 %   Neutro Abs 4.8 1.7 - 7.7 K/uL   Lymphocytes Relative 29 %   Lymphs Abs 2.2 0.7 - 4.0 K/uL   Monocytes Relative 6 %   Monocytes Absolute 0.4 0.1 - 1.0 K/uL   Eosinophils Relative 0 %   Eosinophils Absolute 0.0 0.0 - 0.5 K/uL   Basophils Relative 0 %   Basophils Absolute 0.0 0.0 - 0.1 K/uL   Immature Granulocytes 0 %   Abs Immature Granulocytes 0.01 0.00 - 0.07 K/uL  Comprehensive metabolic panel     Status: Abnormal   Collection Time: 08/19/21 10:21 PM  Result Value Ref Range   Sodium 135 135 - 145 mmol/L   Potassium 3.5 3.5 - 5.1 mmol/L   Chloride 105 98 - 111 mmol/L   CO2 23 22 - 32 mmol/L   Glucose, Bld 92 70 - 99 mg/dL   BUN 7 6 - 20 mg/dL   Creatinine, Ser 0.60 0.44 - 1.00 mg/dL   Calcium 8.7 (L) 8.9 - 10.3 mg/dL   Total Protein 6.9 6.5 - 8.1 g/dL   Albumin 3.6 3.5 - 5.0 g/dL   AST 11 (L) 15 - 41 U/L   ALT 7 0 - 44 U/L   Alkaline Phosphatase 47 38 - 126 U/L   Total Bilirubin 1.1 0.3 - 1.2 mg/dL   GFR, Estimated >60 >60 mL/min   Anion gap 7 5 - 15    MDM/MAU Course: Given LR bolus + zofran and pepcid IVPB with good relief of nausea and upper abdominal pain. Requested something for sleep, benadryl 12.'5mg'$  ordered. Since this is her second visit for nausea/vomiting of pregnancy, decadron '10mg'$  also ordered.   Instructed pt to try phenergan vaginally at night, followed by diclegis 36mn later prior to bedtime for better nausea coverage. Pt amenable to plan and stable for discharge.  A Nausea and vomiting of pregnancy [redacted] weeks gestation of pregnancy  P Discharge from MAU in stable condition with first trimester precautions [facility]  Allergies as of 08/20/2021       Reactions   Fentanyl Hives   Reports severe itching with red rash after IV Fentanyl.    Other Anxiety, Rash   Medication for sleep given while inpatient; doesn't remember name, no matching medications listed on chart history.  Possibly ambien, lunesta, benadryl, doxylamine.        Medication List     STOP taking these medications    acetaminophen 650 MG suppository Commonly known as: TYLENOL   metoCLOPramide 10 MG tablet Commonly known as: Reglan       TAKE these medications    Bonjesta 20-20 MG Tbcr Generic drug: Doxylamine-Pyridoxine ER Take 1 tablet by mouth at bedtime. Take 255m after phenergan   famotidine 20 MG tablet Commonly known as: Pepcid Take 1 tablet (20 mg total) by mouth 2 (two) times daily.   ferrous sulfate 325 (65 FE) MG EC tablet Take 325 mg by mouth 3 (three) times daily with meals.   promethazine 25 MG tablet Commonly known as: PHENERGAN Take 1 tablet (25 mg total) by mouth at bedtime. Place vaginally at bedtime, wait 20-3074mbefore taking other meds What changed:  medication strength how much to take when to take this reasons to take this additional instructions   sertraline 50 MG tablet Commonly known as: ZOLOFT Take 1 tablet (50 mg total) by mouth at  bedtime.       WalGilford Rile  Cristy Folks, CNM 08/20/2021 12:04 AM

## 2021-08-19 NOTE — MAU Note (Signed)
[name] [name] is a 36 y.o. at 39w0dhere in MAU reporting: is so dizzy, has a very bad Headache, any times she eats, she throws up. Is taking the meds she was given. Having very bad pain in upper mid stomach.   No bleeding. No urinary problems. No diarrhea.   Onset of complaint: HA x2 days Pain score: abd 8, HA 9 Vitals:   08/19/21 1735  BP: 113/74  Pulse: 95  Resp: 16  Temp: 98.2 F (36.8 C)  SpO2: 98%      Lab orders placed from triage:  urine

## 2021-08-20 DIAGNOSIS — O219 Vomiting of pregnancy, unspecified: Secondary | ICD-10-CM

## 2021-08-20 DIAGNOSIS — Z3A01 Less than 8 weeks gestation of pregnancy: Secondary | ICD-10-CM | POA: Diagnosis not present

## 2021-08-20 MED ORDER — PROMETHAZINE HCL 25 MG PO TABS: 25.0000 mg | ORAL_TABLET | Freq: Every day | ORAL | 0 refills | Status: DC

## 2021-08-20 MED ORDER — BONJESTA 20-20 MG PO TBCR: 1.0000 | EXTENDED_RELEASE_TABLET | Freq: Every day | ORAL | 6 refills | Status: DC

## 2021-08-20 NOTE — Discharge Instructions (Signed)
Place phenergan (promethazine) vaginally about 91mn before bedtime, then take bongesta by mouth right before bed.

## 2021-09-04 LAB — OB RESULTS CONSOLE GC/CHLAMYDIA
Chlamydia: NEGATIVE
Neisseria Gonorrhea: NEGATIVE

## 2021-09-26 LAB — OB RESULTS CONSOLE RPR: RPR: NONREACTIVE

## 2021-09-26 LAB — OB RESULTS CONSOLE HIV ANTIBODY (ROUTINE TESTING): HIV: NONREACTIVE

## 2021-09-26 LAB — OB RESULTS CONSOLE HEPATITIS B SURFACE ANTIGEN: Hepatitis B Surface Ag: NEGATIVE

## 2021-09-26 LAB — HEPATITIS C ANTIBODY: HCV Ab: NEGATIVE

## 2021-09-26 LAB — OB RESULTS CONSOLE RUBELLA ANTIBODY, IGM: Rubella: IMMUNE

## 2021-09-26 LAB — OB RESULTS CONSOLE ABO/RH: RH Type: NEGATIVE

## 2021-09-26 LAB — OB RESULTS CONSOLE ANTIBODY SCREEN: Antibody Screen: NEGATIVE

## 2021-10-03 ENCOUNTER — Other Ambulatory Visit: Payer: Self-pay

## 2021-10-04 ENCOUNTER — Other Ambulatory Visit: Payer: Self-pay

## 2021-10-14 ENCOUNTER — Ambulatory Visit: Payer: Medicaid Other | Attending: Obstetrics and Gynecology

## 2021-10-14 DIAGNOSIS — Z1589 Genetic susceptibility to other disease: Secondary | ICD-10-CM

## 2021-10-14 DIAGNOSIS — Z148 Genetic carrier of other disease: Secondary | ICD-10-CM

## 2021-10-14 DIAGNOSIS — O09522 Supervision of elderly multigravida, second trimester: Secondary | ICD-10-CM | POA: Diagnosis not present

## 2021-10-14 NOTE — Progress Notes (Signed)
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   EDC: 04/02/2021 LMP: Does not align with EGA  Referring Provider:  Donnel Saxon, CNM  EGA: 21w4dGenetic Counselor: AStaci Righter MS, CGC  OB Hx:: [id number] patient report Date of Appointment: 10/14/2021  Accompanied by: Her friend, Basma Face to Face Time: 650Minutes   Previous Testing Completed: CBC from 09/26/2021 reviewed. MCV within normal limits. It is unlikely that MRebecais a beta thalassemia carrier or an alpha thalassemia carrier of the double-gene deletion. Individuals with a normal MCV may be single-gene deletion carriers, but it is unlikely that the current pregnancy would be affected with alpha or beta thalassemia major. Hemoglobin fractionation cascade from 09/26/2021 reviewed. No abnormal hemoglobin bands were noted.  Starlene previously completed cell-free DNA screening (cfDNA) in this pregnancy. The result is low risk, consistent with a female fetus. This screening significantly reduces the risk that the current pregnancy has Down syndrome, Trisomy 154 Trisomy 142 and common sex chromosome conditions, however, the risk is not zero given the limitations of cfDNA. Additionally, there are many genetic conditions that cannot be detected by cfDNA.  Aleah previously completed carrier screening. She screened to be a Fragile X premutation carrier with 31 and 61 CGG repeats in the FMR1 gene. She screened to not be a carrier for Cystic Fibrosis (CF), Spinal Muscular Atrophy (SMA), and Duchenne/Becker Muscular Dystrophy (DMD/BMD). A negative result on carrier screening reduces the likelihood of being a carrier, however, does not entirely rule out the possibility.   Medical History: Denies personal history of diabetes, high blood  pressure, thyroid conditions, and seizures. Denies bleeding, infections, and fevers in this pregnancy. Denies using tobacco, alcohol, or street drugs in this pregnancy.   Family History: A pedigree was created and scanned into Epic under the Media tab. Maternal ethnicity reported as [place] paternal ethnicity reported as [place] Family history not remarkable for consanguinity, individuals with birth defects, intellectual disability, autism spectrum disorder, multiple spontaneous abortions, still births, or unexplained neonatal death. In Eryca's prenatal records from her OB office it is noted that Naylee's brother has Down syndrome. Reine denied this family history and states her brother is healthy. She states he does not have Down syndrome.      Genetic Counseling:   Premutation Carrier for FIntel CorporationSyndrome. Fragile X Syndrome is an x-linked condition caused by a mutation in the FMR1 gene known as a CGG repeat. Humans typically have between 6 and 463CGG repeats in the FMR1 gene. If a person has between 485and 519CGG repeats, they are an intermediate carrier. If a person has between 545and 200 repeats, they are a premutation carrier. Individuals with >200 CGG repeats (full mutation) are considered to be affected with Fragile X syndrome. Kimbely's carrier screen indicates that one of her X chromosomes has 31 CGG repeats, and her other X chromosome has 61 CGG repeats (Fragile X Premutation). Given this information we do not consider Rada to be affected with Fragile X syndrome, however, premutation carriers may sometimes have behavior problems, social anxiety, and/or difficulty with social skills. It was discussed with MRoxy Horsemanthat there is a 50% risk she will pass on her X chromosome with the expanded CGG repeat length in each pregnancy. Recent literature suggests that  the risk of expansion to a full mutation is influenced by another set of trinucleotides called AGG. AGG repeats within the CGG repeat region  in the FMR1 gene influence the risk of expansion to a full mutation from parent to child. CGG repeat regions with no AGG interruptions have the greatest risk for full mutation expansion. Thus, AGG repeat testing is critical to accurately assess an individual's chance of having a child with >200 CGG repeats. Naidelyn's AGG testing through [facility] is still pending. We reviewed that we will be able to share the chance for the premutation to expand into a full mutation once this has resulted.   Most individuals with >200 CGG repeats have abnormal gene methylation that silences the FMR1 gene, preventing production of the FMRP protein. This protein has a role in the development of synapses that are critical for relaying nerve impulses. Loss or deficiency of the FMRP protein leads to the symptoms associated with Fragile X Syndrome, including mild to moderate intellectual disability, developmental delays, autism, behavioral abnormalities, and characteristic physical features. We discussed that males with >200 CGG repeats are more severely affected than females since males have one X chromosome and females have two X chromosomes. Furthermore, 100% of males and 50% of females with a completely methylated full mutation would be expected to have intellectual disability. Males with an unmethylated full mutation may not have intellectual disability, or if intellectual disability is present it may be high functioning. They may also have anxiety or behavioral issues. Females with an unmethylated full mutation may have normal intellect or intellectual disability, the range varies. Given the risk to the current female pregnancy, Malea was offered and accepted amniocentesis for prenatal diagnosis. She is scheduled for amniocentesis on 10/18/21.  Genetic counseling also shared with Lil that about 15-27% of women with a premutation have a condition called Fragile X-Associated Primary Ovarian Insufficiency (FXPOI). These women may have  irregular menstrual cycles, early-onset elevated levels of follicle stimulating hormone [facility] early menopause, and may have infertility or a more difficult time having children. Women who are premutation carriers also have an 8-17% chance of developing a disorder known as Fragile X-Associated Tremor/Ataxia Syndrome (FXTAS). Women with FXTAS have problems starting after age 16 that include problems with movement (ataxia), tremor, memory loss, loss of sensation in the lower extremities, and mental and behavioral changes.  Finally, we discussed that while Carmencita's three daughters are healthy and are reported to not have any intellectual disabilities or developmental delays it may be beneficial for them to have a consultation with pediatric genetics for appropriate management and follow-up.  Maternal SDHA gene mutation. Tallie completed cancer genetic counseling and genetic testing for inherited cancer syndromes in 2022. 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 Please refer to the cancer genetic counseling notes/documentation for information regarding Raeden's cancer risk and recommended cancer screenings. Today we reviewed with [facility] that there is a 50% chance all of her biological children will inherit her SDHA gene mutation and also have an increased risk of developing cancers. Shaquala stated she was aware of this risk. We also reviewed that individuals with a pathogenic variant in the SDHA gene are also considered to be carriers of mitochondrial complex II deficiency syndrome. This is an autosomal recessive condition characterized by neurodegeneration and encephalomyopathy. We reviewed that if Deshannon's reproductive partner also carries an SDHA pathogenic mutation there would be a 1 in 4 (25%) chance  for each of  their children together to be affected. We offered testing of the [place] gene for Alexiss's reproductive partner. Onelia is going  to discuss SDHA testing with her reproductive partner and will let genetic counseling know if he is interested. If both members of a couple are known to be carriers, diagnostic testing via amniocentesis is available to determine if the pregnancy is affected. If diagnostic testing via amniocentesis is not desired, mitochondrial complex II deficiency syndrome testing can be completed after birth.     Testing/Screening Options:   Amniocentesis for Prenatal Diagnosis. This procedure involves the removal of a small amount of amniotic fluid from the sac surrounding the pregnancy with the use of a thin needle inserted through the maternal abdomen and uterus. Ultrasound guidance is used throughout the procedure. Possible procedural difficulties and complications that can arise include maternal infection, cramping, bleeding, fluid leakage, and/or pregnancy loss. The risk for pregnancy loss with an amniocentesis is 1/500. Per the [employer] of [employer]st[employer] Gynecologists (ACOG) Practice Bulletin 162, all pregnant women should be offered prenatal assessment for aneuploidy by diagnostic testing regardless of maternal age or other risk factors. If indicated, genetic testing that could be ordered on an amniocentesis sample includes a karyotype, microarray, and testing for specific syndromes such as Fragile X. A karyotype can detect chromosomal aneuploidies as well as large deletions or duplications of chromosomal material and chromosomal rearrangements. Microarray assesses for smaller pieces of chromosomal material that are missing or extra that fall below the detection range of a karyotype. These chromosomal changes can be associated with various microdeletion or microduplication syndromes that could have impacts on one's health or development. A microarray can also detect some microdeletion and microduplications that have unclear clinical relevance (variants of uncertain significance) and consanguinity.    Carrier Screening. Per the ACOG Committee Opinion 691, if an individual is found to be a carrier for a specific condition, the individual's reproductive partner should be offered testing in order to receive informed genetic counseling about potential reproductive outcomes. Genetic counseling offered carrier testing for Dynasti's reproductive partner for the SDHA gene given Jeslyn's carrier status.     Patient Plan:  Proceed with: Amniocentesis scheduled for 10/18/21. We will have the laboratory prioritize Fragile X testing. [place] is going to discuss [place] carrier testing with her reproductive partner and will let genetic counseling know if he is interested in this testing.  Informed consent was obtained. All questions were answered.    Thank you for sharing in the care of [facility] with [place].  Please do not hesitate to contact us if you have any questions.  Staci Righter, MS, [place] Certified Genetic Counselor  Genetic counseling student involved in appointment: Yes (C.D.)

## 2021-10-15 ENCOUNTER — Other Ambulatory Visit: Payer: Self-pay

## 2021-10-18 ENCOUNTER — Other Ambulatory Visit: Payer: Self-pay | Admitting: Obstetrics and Gynecology

## 2021-10-18 ENCOUNTER — Ambulatory Visit: Payer: Medicaid Other | Admitting: *Deleted

## 2021-10-18 ENCOUNTER — Ambulatory Visit: Payer: Medicaid Other | Attending: Obstetrics and Gynecology

## 2021-10-18 ENCOUNTER — Ambulatory Visit: Payer: Medicaid Other

## 2021-10-18 VITALS — BP 119/78 | HR 86

## 2021-10-18 DIAGNOSIS — O09522 Supervision of elderly multigravida, second trimester: Secondary | ICD-10-CM | POA: Insufficient documentation

## 2021-10-18 DIAGNOSIS — Z363 Encounter for antenatal screening for malformations: Secondary | ICD-10-CM

## 2021-10-18 DIAGNOSIS — O285 Abnormal chromosomal and genetic finding on antenatal screening of mother: Secondary | ICD-10-CM

## 2021-10-18 DIAGNOSIS — Z3A16 16 weeks gestation of pregnancy: Secondary | ICD-10-CM | POA: Diagnosis not present

## 2021-10-18 DIAGNOSIS — Z148 Genetic carrier of other disease: Secondary | ICD-10-CM | POA: Insufficient documentation

## 2021-10-18 DIAGNOSIS — Q992 Fragile X chromosome: Secondary | ICD-10-CM

## 2021-10-18 MED ORDER — RHO D IMMUNE GLOBULIN 1500 UNIT/2ML IJ SOSY
300.0000 ug | PREFILLED_SYRINGE | Freq: Once | INTRAMUSCULAR | Status: AC
  Administered 2021-10-18: 300 ug via INTRAMUSCULAR

## 2021-10-18 NOTE — Addendum Note (Signed)
Addended by: Staci Righter on: 10/18/2021 02:42 PM   Modules accepted: Orders

## 2021-10-18 NOTE — Addendum Note (Signed)
Addended by: Lanier Prude A on: 10/18/2021 04:32 PM   Modules accepted: Orders

## 2021-10-20 LAB — FRAGILE X DNA AND SB, PRENATAL

## 2021-10-21 ENCOUNTER — Telehealth: Payer: Self-pay | Admitting: Genetics

## 2021-10-21 ENCOUNTER — Telehealth: Payer: Self-pay | Admitting: *Deleted

## 2021-10-21 LAB — AFP, AMNIOTIC FLUID
AFP, Amniotic Fluid (mcg/ml): 23.3 ug/mL
Gestational Age(Wks): 16
MOM, Amniotic Fluid: 1.74

## 2021-10-21 NOTE — Telephone Encounter (Signed)
Called [name] [name] to return normal amniotic fluid AFP results. All questions answered. The Fragile X syndrome testing and fetal karyotype are pending.

## 2021-10-21 NOTE — Telephone Encounter (Signed)
Phoned pt to inquire how she did over the weekend after the amniocentesis. No answer, left message.

## 2021-10-21 NOTE — Progress Notes (Signed)
   [facility] at [facility] for [place], [address] Phone:  [phone]   Fax:  [phone]     Alexsandra initially completed genetic counseling on 10/14/21 to discuss her Fragile X Premutation carrier screening results. Please see the note from that date for full details of the genetic counseling session. Today genetic counseling briefly met with Lalah and her reproductive partner to review the AGG interruption testing results that were returned by the laboratory since Aubrii's appointment on 10/14/21. We discussed that Tenita's result and the risk to her offspring has been refined. Specifically, one of Bonnell's X chromosomes has 30 CGG repeats with 2 AGG interruptions while her other X chromosome has 60 CGG repeats with 2 AGG interruptions. The laboratory estimates that the risk of the X chromosome with the 60 CGG repeats and 2 AGG interruptions expanding to a full mutation (>200 CGG repeats) upon transmission to offspring is less than 1%. The laboratory additionally reports that of 40 mother to child FMR1 transmissions from women with 58-62 CGG repeats and 2 AGG interruptions, zero expanded to a full Fragile X mutation. After hearing the above information Teisha opted to continue with her plan of amniocentesis for prenatal diagnosis. This procedure was performed today. Please see separate ultrasound report for details.   Testing ordered on the amniocentesis sample includes: AF-AFP, Fragile X syndrome testing with maternal cell contamination studies, and a fetal karyotype. The couple declined a fetal microarray.    Thank you for sharing in the care of [facility] with [place].  Please do not hesitate to contact us if you have any questions.  Staci Righter, MS, [facility]

## 2021-10-24 ENCOUNTER — Other Ambulatory Visit: Payer: Self-pay

## 2021-10-28 ENCOUNTER — Telehealth: Payer: Self-pay | Admitting: Genetics

## 2021-10-28 LAB — CHROMOSOME, AMNIOTIC FLUID
Cells Analyzed: 15
Cells Counted: 15
Cells Karyotyped: 2
Colonies: 15
GTG Band Resolution Achieved: 450

## 2021-10-28 NOTE — Telephone Encounter (Signed)
[facility] with normal fetal karyotype: 46,XY. The Fragile X syndrome results are pending. All questions answered.

## 2021-11-11 ENCOUNTER — Ambulatory Visit: Payer: Medicaid Other

## 2021-11-11 ENCOUNTER — Ambulatory Visit: Payer: Medicaid Other | Attending: Obstetrics and Gynecology

## 2021-11-11 ENCOUNTER — Other Ambulatory Visit: Payer: Self-pay | Admitting: Obstetrics and Gynecology

## 2021-11-11 DIAGNOSIS — Z36 Encounter for antenatal screening for chromosomal anomalies: Secondary | ICD-10-CM

## 2021-11-13 LAB — MCC TRACKING

## 2021-11-14 ENCOUNTER — Telehealth: Payer: Self-pay | Admitting: Genetics

## 2021-11-14 ENCOUNTER — Telehealth: Payer: Self-pay

## 2021-11-14 LAB — FRAGILE X SOUTHERN BLOT, PRNTL

## 2021-11-14 LAB — MATERNAL CELL CONTAMINATION

## 2021-11-14 LAB — FRAGILE X DNA AND SB, PRENATAL

## 2021-11-14 NOTE — Telephone Encounter (Signed)
Call patient to schedule for a Detail, Anatomy ultrasound per Analyssa - patient has not had all of the Anatomy cleared on her baby.  No option to leave a message for her to call our office

## 2021-11-14 NOTE — Telephone Encounter (Signed)
Called [name] [name] to return normal fetal Fragile X testing results. The current pregnancy has 31 CGG repeats. The current pregnancy does NOT have Fragile X syndrome due to an expansion mutation.

## 2021-12-19 ENCOUNTER — Encounter: Payer: Self-pay | Admitting: *Deleted

## 2022-01-06 NOTE — L&D Delivery Note (Signed)
Delivery Note Called to room for delivery. Patient was found to be complete and +1 station. Fetal head was delivered over intact perineum. A nuchal and body cord was present (around the arm). Shoulders and body followed without difficulty. Infant was placed on mother's abdomen, mouth and nose were bulb suctioned and let out a vigorous cry. Delayed cord clamping was performed for 60 seconds. Cord clamped and cut by FOB. Venous cord blood was collected. Placenta delivered spontaneously. Cervix, vagina, perineum and labia were inspected and superficial abrasions on the perineum made hemostatic with pressure. Uterine fundus firm. TXA 1g IV x 1 and Cytotec 1098mcg per rectum was given as prophylaxis. Due to trickling, Methergine 0.20mg  IM x 1 was given. Uterine fundus firm. Placenta was inspected, found to be intact with a 3 vessel cord. Counts were correct.   At 8:22 PM a viable and healthy female was delivered via Vaginal, Spontaneous (Presentation:      Right Occiput Anterior).  APGAR: 9,9 ; weight: pending.  Placenta status:  Spontaneous,Intact  .  Cord:  3 vessels with the following complications: None. Cord pH: N/A  Anesthesia: Epidural Episiotomy:  N/A Lacerations:  None, superficial perineal abrasions Suture Repair:  N/A Est. Blood Loss (mL):  302  Mom to postpartum.  Baby to Couplet care / Skin to Skin.  [name] [name] 03/27/2022, 8:37 PM

## 2022-02-12 ENCOUNTER — Other Ambulatory Visit: Payer: Self-pay

## 2022-02-28 ENCOUNTER — Telehealth: Payer: Self-pay | Admitting: Physician Assistant

## 2022-02-28 NOTE — Telephone Encounter (Signed)
Per 2/23 IB scheduled patient, okay'd per [name].

## 2022-03-13 ENCOUNTER — Inpatient Hospital Stay (HOSPITAL_COMMUNITY)
Admission: AD | Admit: 2022-03-13 | Discharge: 2022-03-13 | Disposition: A | Discharge status: Home / Self Care | Payer: Medicaid Other | Attending: Obstetrics & Gynecology | Admitting: Obstetrics & Gynecology

## 2022-03-13 ENCOUNTER — Other Ambulatory Visit: Payer: Self-pay

## 2022-03-13 ENCOUNTER — Other Ambulatory Visit: Payer: Self-pay | Admitting: Family Medicine

## 2022-03-13 ENCOUNTER — Encounter (HOSPITAL_COMMUNITY): Payer: Self-pay | Admitting: Obstetrics & Gynecology

## 2022-03-13 DIAGNOSIS — O26893 Other specified pregnancy related conditions, third trimester: Secondary | ICD-10-CM | POA: Diagnosis not present

## 2022-03-13 DIAGNOSIS — M5431 Sciatica, right side: Secondary | ICD-10-CM | POA: Insufficient documentation

## 2022-03-13 DIAGNOSIS — O09523 Supervision of elderly multigravida, third trimester: Secondary | ICD-10-CM | POA: Insufficient documentation

## 2022-03-13 DIAGNOSIS — Z3A35 35 weeks gestation of pregnancy: Secondary | ICD-10-CM | POA: Diagnosis not present

## 2022-03-13 MED ORDER — LIDOCAINE 5 % EX PTCH
1.0000 | MEDICATED_PATCH | CUTANEOUS | Status: DC
Start: 2022-03-13 — End: 2022-03-13
  Filled 2022-03-13: qty 1

## 2022-03-13 MED ORDER — CYCLOBENZAPRINE HCL 5 MG PO TABS
10.0000 mg | ORAL_TABLET | Freq: Once | ORAL | Status: AC
  Administered 2022-03-13: 10 mg via ORAL
  Filled 2022-03-13: qty 2

## 2022-03-13 MED ORDER — CAPSAICIN 0.075 % EX CREA
TOPICAL_CREAM | Freq: Two times a day (BID) | CUTANEOUS | Status: DC
  Filled 2022-03-13: qty 60

## 2022-03-13 NOTE — MAU Note (Signed)
.  [name] [name] is a 37 y.o. at 73w3dhere in MAU reporting: she's had right hip pain since yesterday.  Reports pain is with walking, sitting, and bending.  Reports took Tylenol but no relief, last taken @ 0800 today. Denies VB or LOF.  Reports +FM, but less than usual. LMP: NA Onset of complaint: yesterday Pain score: 8 Vitals:   03/13/22 1359  BP: 121/73  Pulse: 99  Resp: 19  Temp: 98.2 F (36.8 C)  SpO2: 99%     FHT:135 bpm Lab orders placed from triage:   None

## 2022-03-13 NOTE — MAU Provider Note (Signed)
History     CSN: [id number]  Arrival date and time: 03/13/22 1342   Event Date/Time   First Provider Initiated Contact with Patient 03/13/22 1445      Chief Complaint  Patient presents with   Hip Pain   She describes it at nerve pain that is severe and shooting down the back of her leg.   Hip Pain  The incident occurred 12 to 24 hours ago. The incident occurred at home. There was no injury mechanism. The pain is present in the right hip. The quality of the pain is described as shooting and aching. The pain is severe. The pain has been Improving (improved from yesterday) since onset. Associated symptoms include muscle weakness, numbness and tingling. The symptoms are aggravated by movement, palpation and weight bearing. She has tried acetaminophen and rest ('5mg'$  of flexeril yesterday) for the symptoms. The treatment provided mild relief.     OB History     Gravida  5   Para  3   Term  3   Preterm  0   AB  1   Living  3      SAB  0   IAB  1   Ectopic  0   Multiple  0   Live Births  3           Past Medical History:  Diagnosis Date   Anemia 2009   Anxiety    Back pain 2010   COVID-19    Exposure to x-rays 2012   normal   Family history of breast cancer    Family history of uterine cancer    H. pylori infection 2019   H/O candidiasis    H/O varicella    History of gastroesophageal reflux (GERD) 2010   taking with tums, helps    Hypotension    Migraines 2009   Sleep apnea 2012    Past Surgical History:  Procedure Laterality Date   COLPOSCOPY  04/02/2011   NO PAST SURGERIES      Family History  Problem Relation Age of Onset   Asthma Mother    Cancer Mother 76       breast, cervix and bone and liver   Breast cancer Mother 57       mets to bone and liver   Asthma Father    Cancer Other    Heart disease Other    Stroke Other    Thyroid disease Other    Diabetes Other    Hypertension Other    Hyperlipidemia Other    Hypertension  Maternal Aunt    Heart disease Maternal Grandmother    Hypertension Maternal Grandmother    Uterine cancer Maternal Grandmother 60   Hypertension Maternal Grandfather    Heart Problems Maternal Grandfather    Breast cancer Maternal Aunt 50   Heart Problems Maternal Uncle    Heart Problems Maternal Uncle    Anesthesia problems Neg Hx     Social History   Tobacco Use   Smoking status: Never   Smokeless tobacco: Never  Vaping Use   Vaping Use: Never used  Substance Use Topics   Alcohol use: No   Drug use: No    Allergies:  Allergies  Allergen Reactions   Fentanyl Hives    Reports severe itching with red rash after IV Fentanyl.    Other Anxiety and Rash    Medication for sleep given while inpatient; doesn't remember name, no matching medications listed on chart history.  Possibly  ambien, lunesta, benadryl, doxylamine.    Medications Prior to Admission  Medication Sig Dispense Refill Last Dose   acetaminophen (TYLENOL) 500 MG tablet Take 1,000 mg by mouth every 6 (six) hours as needed.   03/13/2022 at 0800   Doxylamine-Pyridoxine ER (BONJESTA) 20-20 MG TBCR Take 1 tablet by mouth at bedtime. Take 62mn after phenergan 60 tablet 6 03/12/2022   famotidine (PEPCID) 20 MG tablet Take 1 tablet (20 mg total) by mouth 2 (two) times daily. 60 tablet 2 03/12/2022   promethazine (PHENERGAN) 25 MG tablet Take 1 tablet (25 mg total) by mouth at bedtime. Place vaginally at bedtime, wait 20-319m before taking other meds 30 tablet 0 03/12/2022   ferrous sulfate 325 (65 FE) MG EC tablet Take 325 mg by mouth 3 (three) times daily with meals.      sertraline (ZOLOFT) 50 MG tablet Take 1 tablet (50 mg total) by mouth at bedtime. 30 tablet 2     Review of Systems  Constitutional:  Negative for activity change and fever.  Eyes:  Negative for photophobia and visual disturbance.  Respiratory:  Negative for shortness of breath.   Cardiovascular:  Negative for chest pain.  Gastrointestinal:  Negative for  abdominal pain, nausea and vomiting.  Genitourinary:  Negative for difficulty urinating, dysuria and vaginal bleeding.  Musculoskeletal:  Positive for arthralgias, gait problem and myalgias.  Neurological:  Positive for tingling and numbness.  Psychiatric/Behavioral:  Negative for sleep disturbance.    Physical Exam   Blood pressure 109/65, pulse 90, temperature 98.2 F (36.8 C), temperature source Oral, resp. rate 19, weight 76.7 kg, last menstrual period 07/08/2021, SpO2 99 %.  NST:  Baseline: 130 bpm, Variability: Good {> 6 bpm), Accelerations: Reactive, Decelerations: Absent, and no contractions  Physical Exam Constitutional:      General: She is in acute distress.     Appearance: She is not ill-appearing, toxic-appearing or diaphoretic.  HENT:     Head: Normocephalic.  Eyes:     Extraocular Movements: Extraocular movements intact.     Conjunctiva/sclera: Conjunctivae normal.  Cardiovascular:     Rate and Rhythm: Normal rate and regular rhythm.     Pulses: Normal pulses.     Heart sounds: Normal heart sounds.  Pulmonary:     Effort: Pulmonary effort is normal.     Breath sounds: Normal breath sounds.  Abdominal:     Palpations: Abdomen is soft.     Tenderness: There is no abdominal tenderness. There is no guarding or rebound.     Hernia: No hernia is present.  Musculoskeletal:        General: Tenderness present. No swelling.     Right lower leg: No edema.     Left lower leg: No edema.     Comments: Right Hip:  - Inspection: No gross deformity, no swelling, erythema, or ecchymosis b/l - Palpation: TTP, specifically over Right PSIS, posterior hamstring, and posterior calf - ROM: Normal range of motion  - Strength: 4/5 strength - Neuro/vasc: NV intact distally b/l - Special Tests: Negative homans   Neurological:     Mental Status: She is alert and oriented to person, place, and time.     MAU Course  Procedures  MDM Meds ordered this encounter  Medications    cyclobenzaprine (FLEXERIL) tablet 10 mg   DISCONTD: lidocaine (LIDODERM) 5 % 1 patch   capsicum (ZOSTRIX) 0.075 % cream      Assessment and Plan  1. Sciatic nerve pain, right Acute x1 day.  Very tender to palpation along right posterior leg accompanied by painful tingling that shoots from PSIS into her ankle. S/p flexeril with little improvement but the patient wanted to leave to pick up her children. She was sent home with capsicum cream and will need PT outpatient. Return precautions given.   2. [redacted] weeks gestation of pregnancy Routine OB follow up    [place] 03/13/2022, 2:45 PM

## 2022-03-17 ENCOUNTER — Ambulatory Visit (HOSPITAL_BASED_OUTPATIENT_CLINIC_OR_DEPARTMENT_OTHER): Payer: Medicaid Other

## 2022-03-17 ENCOUNTER — Ambulatory Visit: Payer: Medicaid Other | Attending: Obstetrics and Gynecology

## 2022-03-17 VITALS — BP 121/64 | HR 90

## 2022-03-17 DIAGNOSIS — O09523 Supervision of elderly multigravida, third trimester: Secondary | ICD-10-CM | POA: Insufficient documentation

## 2022-03-17 DIAGNOSIS — Z363 Encounter for antenatal screening for malformations: Secondary | ICD-10-CM | POA: Insufficient documentation

## 2022-03-17 DIAGNOSIS — Z36 Encounter for antenatal screening for chromosomal anomalies: Secondary | ICD-10-CM

## 2022-03-17 DIAGNOSIS — Z3A37 37 weeks gestation of pregnancy: Secondary | ICD-10-CM | POA: Insufficient documentation

## 2022-03-20 ENCOUNTER — Other Ambulatory Visit: Payer: Self-pay

## 2022-03-20 DIAGNOSIS — D500 Iron deficiency anemia secondary to blood loss (chronic): Secondary | ICD-10-CM

## 2022-03-20 LAB — OB RESULTS CONSOLE GBS: GBS: NEGATIVE

## 2022-03-21 ENCOUNTER — Inpatient Hospital Stay: Payer: Medicaid Other | Attending: Physician Assistant | Admitting: Physician Assistant

## 2022-03-21 ENCOUNTER — Other Ambulatory Visit: Payer: Self-pay | Admitting: Obstetrics and Gynecology

## 2022-03-21 ENCOUNTER — Inpatient Hospital Stay: Payer: Medicaid Other

## 2022-03-21 VITALS — BP 103/70 | HR 87 | Temp 97.5°F | Resp 13 | Wt 169.2 lb

## 2022-03-21 DIAGNOSIS — D509 Iron deficiency anemia, unspecified: Secondary | ICD-10-CM | POA: Insufficient documentation

## 2022-03-21 DIAGNOSIS — Z3A38 38 weeks gestation of pregnancy: Secondary | ICD-10-CM | POA: Insufficient documentation

## 2022-03-21 DIAGNOSIS — O99013 Anemia complicating pregnancy, third trimester: Secondary | ICD-10-CM | POA: Diagnosis present

## 2022-03-21 DIAGNOSIS — D500 Iron deficiency anemia secondary to blood loss (chronic): Secondary | ICD-10-CM

## 2022-03-21 DIAGNOSIS — Z803 Family history of malignant neoplasm of breast: Secondary | ICD-10-CM | POA: Diagnosis not present

## 2022-03-21 LAB — CBC WITH DIFFERENTIAL (CANCER CENTER ONLY)
Abs Immature Granulocytes: 0.05 10*3/uL (ref 0.00–0.07)
Basophils Absolute: 0 10*3/uL (ref 0.0–0.1)
Basophils Relative: 0 %
Eosinophils Absolute: 0.1 10*3/uL (ref 0.0–0.5)
Eosinophils Relative: 1 %
HCT: 27.4 % — ABNORMAL LOW (ref 36.0–46.0)
Hemoglobin: 8.9 g/dL — ABNORMAL LOW (ref 12.0–15.0)
Immature Granulocytes: 1 %
Lymphocytes Relative: 30 %
Lymphs Abs: 2.6 10*3/uL (ref 0.7–4.0)
MCH: 26.3 pg (ref 26.0–34.0)
MCHC: 32.5 g/dL (ref 30.0–36.0)
MCV: 80.8 fL (ref 80.0–100.0)
Monocytes Absolute: 0.5 10*3/uL (ref 0.1–1.0)
Monocytes Relative: 6 %
Neutro Abs: 5.4 10*3/uL (ref 1.7–7.7)
Neutrophils Relative %: 62 %
Platelet Count: 206 10*3/uL (ref 150–400)
RBC: 3.39 MIL/uL — ABNORMAL LOW (ref 3.87–5.11)
RDW: 14.6 % (ref 11.5–15.5)
WBC Count: 8.7 10*3/uL (ref 4.0–10.5)
nRBC: 0 % (ref 0.0–0.2)

## 2022-03-21 LAB — CMP (CANCER CENTER ONLY)
ALT: 6 U/L (ref 0–44)
AST: 13 U/L — ABNORMAL LOW (ref 15–41)
Albumin: 3.6 g/dL (ref 3.5–5.0)
Alkaline Phosphatase: 150 U/L — ABNORMAL HIGH (ref 38–126)
Anion gap: 6 (ref 5–15)
BUN: 5 mg/dL — ABNORMAL LOW (ref 6–20)
CO2: 24 mmol/L (ref 22–32)
Calcium: 8.4 mg/dL — ABNORMAL LOW (ref 8.9–10.3)
Chloride: 106 mmol/L (ref 98–111)
Creatinine: 0.41 mg/dL — ABNORMAL LOW (ref 0.44–1.00)
GFR, Estimated: >60 mL/min (ref >60)
Glucose, Bld: 86 mg/dL (ref 70–99)
Potassium: 3.8 mmol/L (ref 3.5–5.1)
Sodium: 136 mmol/L (ref 135–145)
Total Bilirubin: 0.9 mg/dL (ref 0.3–1.2)
Total Protein: 7.1 g/dL (ref 6.5–8.1)

## 2022-03-21 LAB — IRON AND IRON BINDING CAPACITY (CC-WL,HP ONLY)
Iron: 39 ug/dL (ref 28–170)
Saturation Ratios: 7 % — ABNORMAL LOW (ref 10.4–31.8)
TIBC: 563 ug/dL — ABNORMAL HIGH (ref 250–450)
UIBC: 524 ug/dL — ABNORMAL HIGH (ref 148–442)

## 2022-03-21 LAB — FERRITIN: Ferritin: 3 ng/mL — ABNORMAL LOW (ref 11–307)

## 2022-03-24 ENCOUNTER — Telehealth: Payer: Self-pay

## 2022-03-24 ENCOUNTER — Telehealth (HOSPITAL_COMMUNITY): Payer: Self-pay | Admitting: *Deleted

## 2022-03-24 ENCOUNTER — Encounter (HOSPITAL_COMMUNITY): Payer: Self-pay | Admitting: *Deleted

## 2022-03-24 NOTE — Telephone Encounter (Signed)
can you notify patient that best time to get a transfusion would be day she is getting induction.  I will recommend this to her OB   IT We will see her back in 4-6 weeks after deliver to see if she needs IV iron.    Pt advised and agreed to plan

## 2022-03-24 NOTE — Telephone Encounter (Signed)
Preadmission screen  

## 2022-03-24 NOTE — Progress Notes (Signed)
[place] [phone]   [phone]  PROGRESS NOTE  Patient Care Team: Patient, No Pcp Per as PCP - General (General Practice)   CHIEF COMPLAINTS/PURPOSE OF CONSULTATION:  Iron deficiency anemia during pregnancy  HISTORY OF PRESENTING ILLNESS:  [name] [name] 37 y.o. female returns for a follow up for anemia during pregnancy. She is currently [redacted] weeks pregnant due for induction on 03/29/2022. She was last seen by Dr. Kendarrius Tanzi Limbo on 03/21/2020. She is unaccompanied for this visit.   On exam today, [name] [name] reports that she is fatigue which is expected with her late stage of her pregnancy. She is able to get around and complete her routine activities. She experiences occasional dizziness but no syncopal episodes. She is otherwise feeling well without any new or concerning symptoms. She denies fevers, chills, sweats, shortness of breath, chest pain, cough, headaches, bruising/bleeding or edema. She has no other complaints. Rest of the 10 point ROS is below.   MEDICAL HISTORY:  Past Medical History:  Diagnosis Date   Anemia 2009   Anxiety    Back pain 2010   COVID-19    Exposure to x-rays 2012   normal   Family history of breast cancer    Family history of uterine cancer    H. pylori infection 2019   H/O candidiasis    H/O varicella    History of gastroesophageal reflux (GERD) 2010   taking with tums, helps    Hypotension    Migraines 2009   Sleep apnea 2012    SURGICAL HISTORY: Past Surgical History:  Procedure Laterality Date   COLPOSCOPY  04/02/2011   NO PAST SURGERIES      SOCIAL HISTORY: Social History   Socioeconomic History   Marital status: Married    Spouse name: Not on file   Number of children: Not on file   Years of education: Not on file   Highest education level: Not on file  Occupational History   Occupation: unemployed   Tobacco Use   Smoking status: Never   Smokeless tobacco: Never  Vaping Use   Vaping Use: Never  used  Substance and Sexual Activity   Alcohol use: No   Drug use: No   Sexual activity: Yes    Birth control/protection: None  Other Topics Concern   Not on file  Social History Narrative   Moved to [place] from [place] 7 years ago.   Married, husband age 45.   Muslim-no pork, needs female provider.    Mom is actively involved with patient. She has been here for 25 years.    Social Determinants of Health   Financial Resource Strain: Not on file  Food Insecurity: No Food Insecurity (09/11/2020)   Hunger Vital Sign    Worried About Running Out of Food in the Last Year: Never true    Ran Out of Food in the Last Year: Never true  Transportation Needs: No Transportation Needs (09/11/2020)   PRAPARE - Hydrologist (Medical): No    Lack of Transportation (Non-Medical): No  Physical Activity: Not on file  Stress: Not on file  Social Connections: Not on file  Intimate Partner Violence: Not on file    FAMILY HISTORY: Family History  Problem Relation Age of Onset   Asthma Mother    Cancer Mother 73       breast, cervix and bone and liver   Breast cancer Mother 32       mets to  bone and liver   Asthma Father    Cancer Other    Heart disease Other    Stroke Other    Thyroid disease Other    Diabetes Other    Hypertension Other    Hyperlipidemia Other    Hypertension Maternal Aunt    Heart disease Maternal Grandmother    Hypertension Maternal Grandmother    Uterine cancer Maternal Grandmother 60   Hypertension Maternal Grandfather    Heart Problems Maternal Grandfather    Breast cancer Maternal Aunt 50   Heart Problems Maternal Uncle    Heart Problems Maternal Uncle    Anesthesia problems Neg Hx     ALLERGIES:  is allergic to fentanyl and other.  MEDICATIONS:  Current Outpatient Medications  Medication Sig Dispense Refill   acetaminophen (TYLENOL) 500 MG tablet Take 1,000 mg by mouth every 6 (six) hours as needed.     cyclobenzaprine  (FLEXERIL) 10 MG tablet Take 10 mg by mouth 4 (four) times daily as needed.     Doxylamine-Pyridoxine ER (BONJESTA) 20-20 MG TBCR Take 1 tablet by mouth at bedtime. Take 75min after phenergan 60 tablet 6   famotidine (PEPCID) 20 MG tablet Take 1 tablet (20 mg total) by mouth 2 (two) times daily. 60 tablet 2   ferrous sulfate 325 (65 FE) MG EC tablet Take 325 mg by mouth 3 (three) times daily with meals.     promethazine (PHENERGAN) 25 MG tablet Take 1 tablet (25 mg total) by mouth at bedtime. Place vaginally at bedtime, wait 20-33min before taking other meds 30 tablet 0   sertraline (ZOLOFT) 50 MG tablet Take 1 tablet (50 mg total) by mouth at bedtime. 30 tablet 2   No current facility-administered medications for this visit.    REVIEW OF SYSTEMS:   Constitutional: ( - ) fevers, ( - )  chills , ( - ) night sweats Eyes: ( - ) blurriness of vision, ( - ) double vision, ( - ) watery eyes Ears, nose, mouth, throat, and face: ( - ) mucositis, ( - ) sore throat Respiratory: ( - ) cough, ( - ) dyspnea, ( - ) wheezes Cardiovascular: ( - ) palpitation, ( - ) chest discomfort, ( - ) lower extremity swelling Gastrointestinal:  ( - ) nausea, ( - ) heartburn, ( - ) change in bowel habits Skin: ( - ) abnormal skin rashes Lymphatics: ( - ) new lymphadenopathy, ( - ) easy bruising Neurological: ( - ) numbness, ( - ) tingling, ( - ) new weaknesses Behavioral/Psych: ( - ) mood change, ( - ) new changes  All other systems were reviewed with the patient and are negative.  PHYSICAL EXAMINATION: ECOG PERFORMANCE STATUS: 1 - Symptomatic but completely ambulatory  Vitals:   03/21/22 1116  BP: 103/70  Pulse: 87  Resp: 13  Temp: (!) 97.5 F (36.4 C)  SpO2: 100%   Filed Weights   03/21/22 1116  Weight: 169 lb 3.2 oz (76.7 kg)    GENERAL: well appearing female in NAD  SKIN: skin color, texture, turgor are normal, no rashes or significant lesions EYES: conjunctiva are pink and non-injected, sclera  clear OROPHARYNX: no exudate, no erythema; lips, buccal mucosa, and tongue normal  LUNGS: clear to auscultation and percussion with normal breathing effort HEART: regular rate & rhythm and no murmurs and no lower extremity edema Musculoskeletal: no cyanosis of digits and no clubbing  PSYCH: alert & oriented x 3, fluent speech NEURO: no focal motor/sensory deficits  LABORATORY DATA:  I have reviewed the data as listed    Latest Ref Rng & Units 03/21/2022   11:05 AM 08/19/2021   10:21 PM 08/14/2021   11:10 PM  CBC  WBC 4.0 - 10.5 K/uL 8.7  7.5  8.5   Hemoglobin 12.0 - 15.0 g/dL 8.9  12.0  13.0   Hematocrit 36.0 - 46.0 % 27.4  35.2  40.1   Platelets 150 - 400 K/uL 206  226  250        Latest Ref Rng & Units 03/21/2022   11:05 AM 08/19/2021   10:21 PM 08/14/2021   11:10 PM  CMP  Glucose 70 - 99 mg/dL 86  92  89   BUN 6 - 20 mg/dL 5  7  7    Creatinine 0.44 - 1.00 mg/dL 0.41  0.60  0.60   Sodium 135 - 145 mmol/L 136  135  136   Potassium 3.5 - 5.1 mmol/L 3.8  3.5  3.7   Chloride 98 - 111 mmol/L 106  105  106   CO2 22 - 32 mmol/L 24  23  21    Calcium 8.9 - 10.3 mg/dL 8.4  8.7  9.0   Total Protein 6.5 - 8.1 g/dL 7.1  6.9  7.6   Total Bilirubin 0.3 - 1.2 mg/dL 0.9  1.1  1.1   Alkaline Phos 38 - 126 U/L 150  47  53   AST 15 - 41 U/L 13  11  13    ALT 0 - 44 U/L 6  7  9      RADIOGRAPHIC STUDIES: I have personally reviewed the radiological images as listed and agreed with the findings in the report. [place] MFM OB DETAIL +14 WK  Result Date: 03/17/2022 ----------------------------------------------------------------------  OBSTETRICS REPORT                       (Signed Final 03/17/2022 02:40 pm) ---------------------------------------------------------------------- Patient Info  ID #:       [id number]                          D.O.B.:  18-Feb-1985 (36 yrs)  Name:       [name] [name]               Visit Date: 03/17/2022 01:13 pm  ---------------------------------------------------------------------- Performed By  Attending:        Valeda Malm DO       Ref. Address:     [facility]                                                             Gynecology  [place]                                                             [place], [place]  Performed By:     Nathen May       Location[facility]         [facility]                                     [facility]                                                             [place] for                                                             [facility]  Referred By:      Donnel Saxon                    CNM ---------------------------------------------------------------------- Orders  #  Description                           Code        Ordered By  1  [place] MFM OB DETAIL +14 WK               [id number]    Donnel Saxon ----------------------------------------------------------------------  #  Order #                     Accession #  Episode #  1  [id number]                   [id number]                 [id number] ---------------------------------------------------------------------- Indications  Advanced maternal age multigravida 4+,        O8.523  third trimester (58 yrs)  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 Cord Insertion:      Visualized, central  Amniotic Fluid  AFI FV:      Within normal limits  AFI Sum(cm)     %Tile       Largest Pocket(cm)  10.84           31          4.52  RUQ(cm)       RLQ(cm)       LUQ(cm)        LLQ(cm)  4.52          1.71          0.9            3.71 ---------------------------------------------------------------------- Biometry  BPD:      92.5  mm     G. Age:  37w 4d         70  %    CI:         73.8   %    70 - 86                                                          FL/HC:      20.0   %    20.9 - 22.7  HC:       342   mm     G. Age:  39w 3d         72  %    HC/AC:      0.94        0.92 - 1.05  AC:      365.1  mm     G. Age:  40w 3d       > 99  %    FL/BPD:     73.9   %    71 - 87  FL:       68.4  mm     G. Age:  35w 1d          5  %    FL/AC:      18.7   %  20 - 24  HUM:      58.8  mm     G. Age:  34w 0d          8  %  Est. FW:    3620  gm           8 lb     88  % ---------------------------------------------------------------------- OB History  Gravidity:    5         Term:   3        Prem:   0        SAB:   0  TOP:          1       Ectopic:  0        Living: 3 ---------------------------------------------------------------------- Gestational Age  LMP:           36w 0d        Date:  07/08/21                   EDD:   04/14/22  U/S Today:     38w 1d                                        EDD:   03/30/22  Best:          37w 4d     Det. ByLoman Chroman         EDD:   04/03/22                                      (09/03/21) ---------------------------------------------------------------------- Anatomy  Cranium:               Appears normal         LVOT:                   Not well visualized  Cavum:                 Previously seen         Aortic Arch:            Not well visualized  Ventricles:            Not well visualized    Ductal Arch:            Not well visualized  Choroid Plexus:        Previously seen        Diaphragm:              Appears normal  Cerebellum:            Previously seen        Stomach:                Appears normal, left                                                                        sided  Posterior Fossa:       Previously seen  Abdomen:                Appears normal  Nuchal Fold:           Previously seen        Abdominal Wall:         Previously seen  Face:                  Orbits and profile     Cord Vessels:           Previously seen                         previously seen  Lips:                  Appears normal         Kidneys:                Appear normal  Palate:                Not well visualized    Bladder:                Appears normal  Thoracic:              Appears normal         Spine:                  Not well visualized  Heart:                 Not well visualized    Upper Extremities:      Lt prev vis, Rt                                                                        visualized  RVOT:                  Appears normal         Lower Extremities:      Visualized  Other:  Fetus appears to be a female. Technically difficult due to advanced          gestational age. Nasal bone, lenses, VC, 3VV and 3VTV visualized. ---------------------------------------------------------------------- Cervix Uterus Adnexa  Cervix  Not visualized (advanced GA >24wks)  Uterus  No abnormality visualized.  Right Ovary  Size(cm)     2.84   x   1.95   x  1.25      Vol(ml): 3.62  Within normal limits.  Left Ovary  No adnexal mass visualized.  Cul De Sac  No free fluid seen.  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 She missed  her anatomy US and  is now here to have a detailed anatomy  US.  Sonographic findings  Single intrauterine pregnancy.  Fetal cardiac activity:  Observed and appears normal.  Presentation: Cephalic.  The anatomic structures that were well seen appear normal  without evidence of soft markers. Due to fetal position  visualization some structures remain suboptimally.  Fetal biometry shows the estimated fetal weight  at the 88  percentile and the abdominal circumference at the 99th  percentile.  Amniotic fluid volume: Within normal limits. MVP: 4.52 cm.  Placenta: Anterior.  Recommendations  - Consider induction around 39-40 weeks due to the [facility] in  the 99th percentile ----------------------------------------------------------------------                  Valeda Malm, DO Electronically Signed Final Report   03/17/2022 02:40 pm ----------------------------------------------------------------------   ASSESSMENT & PLAN [name] [name] is a 37 y.o. female who presents to the hematology clinic for a follow up for iron deficiency anemia during pregnancy.   # Iron Deficiency Anemia in Setting of Pregnancy -- Findings are consistent with iron deficiency anemia secondary to pregnancy.   --Labs today show anemia with Hgb 8.9, MCV 80.8. Iron panel confirms deficiency with saturation 7%, TIBC 563, ferritin 3.  --Since patient is late in her pregnancy, 38 weeks, IV iron will not be effective to bolster Hgb levels before her delivery. Recommend transfusion support on day of induction to improve Hgb levels to 10 g/dL or greater.   --Recommend avoiding the use of p.o. iron therapy after the first trimester as it can cause constipation and is poorly absorbed. --Plan for return to clinic in 6 weeks after delivery to repeat labs and determine need for iron replacement.   No orders of the defined types were placed in this encounter.   All questions were answered. The patient knows to call the clinic with any problems, questions or  concerns.  I have spent a total of 25 minutes minutes of face-to-face and non-face-to-face time, preparing to see the patient,  performing a medically appropriate examination, counseling and educating the patient,documenting clinical information in the electronic health record, and care coordination.   Dede Query, PA-C Department of Hematology/Oncology [place] at [facility] Phone: [phone]

## 2022-03-26 ENCOUNTER — Telehealth: Payer: Self-pay | Admitting: Physician Assistant

## 2022-03-26 NOTE — Telephone Encounter (Signed)
Contacted patient to scheduled appointments. Patient is aware of appointments that are scheduled.   

## 2022-03-27 ENCOUNTER — Inpatient Hospital Stay (HOSPITAL_COMMUNITY): Payer: Medicaid Other

## 2022-03-27 ENCOUNTER — Inpatient Hospital Stay (HOSPITAL_COMMUNITY): Payer: Medicaid Other | Admitting: Anesthesiology

## 2022-03-27 ENCOUNTER — Inpatient Hospital Stay (HOSPITAL_COMMUNITY)
Admission: RE | Admit: 2022-03-27 | Discharge: 2022-03-29 | DRG: 806 | Disposition: A | Discharge status: Home / Self Care | Payer: Medicaid Other | Attending: Obstetrics and Gynecology | Admitting: Obstetrics and Gynecology

## 2022-03-27 ENCOUNTER — Encounter (HOSPITAL_COMMUNITY): Payer: Self-pay | Admitting: Obstetrics and Gynecology

## 2022-03-27 DIAGNOSIS — Z8616 Personal history of COVID-19: Secondary | ICD-10-CM | POA: Diagnosis not present

## 2022-03-27 DIAGNOSIS — Z6791 Unspecified blood type, Rh negative: Secondary | ICD-10-CM

## 2022-03-27 DIAGNOSIS — Z3A39 39 weeks gestation of pregnancy: Secondary | ICD-10-CM

## 2022-03-27 DIAGNOSIS — O9902 Anemia complicating childbirth: Secondary | ICD-10-CM | POA: Diagnosis present

## 2022-03-27 DIAGNOSIS — O26893 Other specified pregnancy related conditions, third trimester: Secondary | ICD-10-CM | POA: Diagnosis present

## 2022-03-27 DIAGNOSIS — Z148 Genetic carrier of other disease: Secondary | ICD-10-CM

## 2022-03-27 DIAGNOSIS — D509 Iron deficiency anemia, unspecified: Secondary | ICD-10-CM | POA: Diagnosis present

## 2022-03-27 DIAGNOSIS — O3663X0 Maternal care for excessive fetal growth, third trimester, not applicable or unspecified: Principal | ICD-10-CM | POA: Diagnosis present

## 2022-03-27 DIAGNOSIS — O872 Hemorrhoids in the puerperium: Secondary | ICD-10-CM | POA: Diagnosis present

## 2022-03-27 DIAGNOSIS — K649 Unspecified hemorrhoids: Secondary | ICD-10-CM | POA: Diagnosis present

## 2022-03-27 DIAGNOSIS — Z3490 Encounter for supervision of normal pregnancy, unspecified, unspecified trimester: Secondary | ICD-10-CM

## 2022-03-27 LAB — CBC
HCT: 27.7 % — ABNORMAL LOW (ref 36.0–46.0)
Hemoglobin: 8.8 g/dL — ABNORMAL LOW (ref 12.0–15.0)
MCH: 25.4 pg — ABNORMAL LOW (ref 26.0–34.0)
MCHC: 31.8 g/dL (ref 30.0–36.0)
MCV: 80.1 fL (ref 80.0–100.0)
Platelets: 215 10*3/uL (ref 150–400)
RBC: 3.46 MIL/uL — ABNORMAL LOW (ref 3.87–5.11)
RDW: 14.6 % (ref 11.5–15.5)
WBC: 10.2 10*3/uL (ref 4.0–10.5)
nRBC: 0 % (ref 0.0–0.2)

## 2022-03-27 LAB — RPR: RPR Ser Ql: NONREACTIVE

## 2022-03-27 LAB — PREPARE RBC (CROSSMATCH)

## 2022-03-27 MED ORDER — OXYCODONE-ACETAMINOPHEN 5-325 MG PO TABS: 2.0000 | ORAL_TABLET | ORAL | Status: DC | PRN

## 2022-03-27 MED ORDER — MISOPROSTOL 200 MCG PO TABS: 1000.0000 ug | ORAL_TABLET | Freq: Once | ORAL | Status: AC

## 2022-03-27 MED ORDER — MISOPROSTOL 200 MCG PO TABS
ORAL_TABLET | ORAL | Status: AC
  Administered 2022-03-27: 1000 ug via RECTAL
  Filled 2022-03-27: qty 5

## 2022-03-27 MED ORDER — DIPHENHYDRAMINE HCL 50 MG/ML IJ SOLN
12.5000 mg | INTRAMUSCULAR | Status: AC | PRN
  Administered 2022-03-27 (×3): 12.5 mg via INTRAVENOUS
  Filled 2022-03-27: qty 1

## 2022-03-27 MED ORDER — FLEET ENEMA 7-19 GM/118ML RE ENEM: 1.0000 | ENEMA | RECTAL | Status: DC | PRN

## 2022-03-27 MED ORDER — OXYTOCIN-SODIUM CHLORIDE 30-0.9 UT/500ML-% IV SOLN: 2.5000 [IU]/h | INTRAVENOUS | Status: DC

## 2022-03-27 MED ORDER — ONDANSETRON HCL 4 MG/2ML IJ SOLN: 4.0000 mg | Freq: Four times a day (QID) | INTRAMUSCULAR | Status: DC | PRN

## 2022-03-27 MED ORDER — SOD CITRATE-CITRIC ACID 500-334 MG/5ML PO SOLN: 30.0000 mL | ORAL | Status: DC | PRN

## 2022-03-27 MED ORDER — PHENYLEPHRINE 80 MCG/ML (10ML) SYRINGE FOR IV PUSH (FOR BLOOD PRESSURE SUPPORT): 80.0000 ug | PREFILLED_SYRINGE | INTRAVENOUS | Status: DC | PRN

## 2022-03-27 MED ORDER — TRANEXAMIC ACID-NACL 1000-0.7 MG/100ML-% IV SOLN
INTRAVENOUS | Status: AC
  Administered 2022-03-27: 1000 mg via INTRAVENOUS
  Filled 2022-03-27: qty 100

## 2022-03-27 MED ORDER — OXYCODONE-ACETAMINOPHEN 5-325 MG PO TABS: 1.0000 | ORAL_TABLET | ORAL | Status: DC | PRN

## 2022-03-27 MED ORDER — LACTATED RINGERS IV SOLN: 500.0000 mL | INTRAVENOUS | Status: DC | PRN

## 2022-03-27 MED ORDER — OXYTOCIN-SODIUM CHLORIDE 30-0.9 UT/500ML-% IV SOLN
1.0000 m[IU]/min | INTRAVENOUS | Status: DC
  Administered 2022-03-27: 2 m[IU]/min via INTRAVENOUS

## 2022-03-27 MED ORDER — OXYTOCIN-SODIUM CHLORIDE 30-0.9 UT/500ML-% IV SOLN
2.5000 [IU]/h | INTRAVENOUS | Status: DC
  Filled 2022-03-27: qty 500

## 2022-03-27 MED ORDER — FENTANYL-BUPIVACAINE-NACL 0.5-0.125-0.9 MG/250ML-% EP SOLN
12.0000 mL/h | EPIDURAL | Status: DC | PRN
  Administered 2022-03-27: 12 mL/h via EPIDURAL
  Filled 2022-03-27: qty 250

## 2022-03-27 MED ORDER — METHYLERGONOVINE MALEATE 0.2 MG/ML IJ SOLN
INTRAMUSCULAR | Status: AC
  Administered 2022-03-27: 0.2 mg via INTRAMUSCULAR
  Filled 2022-03-27: qty 1

## 2022-03-27 MED ORDER — LACTATED RINGERS IV SOLN: INTRAVENOUS | Status: DC

## 2022-03-27 MED ORDER — OXYTOCIN BOLUS FROM INFUSION
333.0000 mL | Freq: Once | INTRAVENOUS | Status: AC
  Administered 2022-03-27: 333 mL via INTRAVENOUS

## 2022-03-27 MED ORDER — LACTATED RINGERS IV SOLN: 500.0000 mL | Freq: Once | INTRAVENOUS | Status: DC

## 2022-03-27 MED ORDER — LIDOCAINE-EPINEPHRINE (PF) 2 %-1:200000 IJ SOLN
INTRAMUSCULAR | Status: DC | PRN
  Administered 2022-03-27: 5 mL via EPIDURAL

## 2022-03-27 MED ORDER — ONDANSETRON HCL 4 MG/2ML IJ SOLN
4.0000 mg | Freq: Four times a day (QID) | INTRAMUSCULAR | Status: DC | PRN
  Administered 2022-03-27: 4 mg via INTRAVENOUS
  Filled 2022-03-27: qty 2

## 2022-03-27 MED ORDER — ACETAMINOPHEN 325 MG PO TABS: 650.0000 mg | ORAL_TABLET | ORAL | Status: DC | PRN

## 2022-03-27 MED ORDER — TERBUTALINE SULFATE 1 MG/ML IJ SOLN: 0.2500 mg | Freq: Once | INTRAMUSCULAR | Status: DC | PRN

## 2022-03-27 MED ORDER — OXYTOCIN BOLUS FROM INFUSION: 333.0000 mL | Freq: Once | INTRAVENOUS | Status: DC

## 2022-03-27 MED ORDER — EPHEDRINE 5 MG/ML INJ: 10.0000 mg | INTRAVENOUS | Status: DC | PRN

## 2022-03-27 MED ORDER — SODIUM CHLORIDE 0.9% IV SOLUTION: Freq: Once | INTRAVENOUS | Status: DC

## 2022-03-27 MED ORDER — LIDOCAINE HCL (PF) 1 % IJ SOLN: 30.0000 mL | INTRAMUSCULAR | Status: DC | PRN

## 2022-03-27 MED ORDER — TRANEXAMIC ACID-NACL 1000-0.7 MG/100ML-% IV SOLN: 1000.0000 mg | Freq: Once | INTRAVENOUS | Status: AC

## 2022-03-27 MED ORDER — ACETAMINOPHEN 325 MG PO TABS
650.0000 mg | ORAL_TABLET | ORAL | Status: DC | PRN
  Administered 2022-03-27: 650 mg via ORAL
  Filled 2022-03-27: qty 2

## 2022-03-27 MED ORDER — METHYLERGONOVINE MALEATE 0.2 MG/ML IJ SOLN: 0.2000 mg | Freq: Once | INTRAMUSCULAR | Status: AC

## 2022-03-27 NOTE — H&P (Addendum)
[name] [name] is a 37 y.o. female presenting for IOL due to AMA, LGA , AC greater than 99%.  Pt has no complaints OB History     Gravida  4   Para  3   Term  3   Preterm  0   AB  0   Living  3      SAB  0   IAB  0   Ectopic  0   Multiple  0   Live Births  3          Past Medical History:  Diagnosis Date   Anemia 2009   Anxiety    COVID-19    Exposure to x-rays 2012   normal   Family history of breast cancer    Family history of uterine cancer    H. pylori infection 2019   H/O candidiasis    H/O varicella    History of gastroesophageal reflux (GERD) 2010   taking with tums, helps    Migraines 2009   Vaginal Pap smear, abnormal    Past Surgical History:  Procedure Laterality Date   NO PAST SURGERIES     Family History: family history includes Asthma in her father and mother; Breast cancer (age of onset: 54) in her maternal aunt and mother; Cancer in an other family member; Cancer (age of onset: 71) in her mother; Diabetes in an other family member; Heart Problems in her maternal grandfather, maternal uncle, and maternal uncle; Heart disease in her maternal grandmother and another family member; Hyperlipidemia in an other family member; Hypertension in her maternal aunt, maternal grandfather, maternal grandmother, and another family member; Stroke in an other family member; Thyroid disease in an other family member; Uterine cancer (age of onset: 72) in her maternal grandmother. Social History:  reports that she has never smoked. She has never used smokeless tobacco. She reports that she does not drink alcohol and does not use drugs.     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  Amnio normal XY Circumvillate placenta  Review of Systems History Dilation: 2.5 Effacement (%): 50 Station: -2 Exam by:: Dr Charlesetta Garibaldi Blood pressure (!) 105/54, pulse 82, temperature 97.8 F (36.6 C), temperature source Oral, resp. rate 16, height 5\' 3"  (1.6 m), weight 76.7 kg, last menstrual period 07/08/2021. Exam Physical Exam  Physical Examination: General appearance - alert, well appearing, and in no distress Chest - clear to auscultation, no wheezes, rales or rhonchi, symmetric air entry Heart - normal rate and regular rhythm Abdomen - soft, nontender, nondistended, no masses or organomegaly gravid Extremities - Homan's sign negative bilaterally  Prenatal labs: ABO, Rh: --/--/A NEG (03/21 0944) Antibody: POS (03/21 0944) Rubella: Immune (09/21 0000) RPR: NON REACTIVE (03/21 0944)  HBsAg: Negative (09/21 0000)  HIV: Non-reactive (09/21 0000)  GBS: Negative/-- (03/14 0000)   Assessment/Plan: Admit for IOL for AMA , LGA ac greater than 99% Pitocin augmentation AROM clear fluid GBS neg Cat 1 tracing Anticipate SVD Epidural for pain Type and cross two units.  If not needed will do iron infusions PP.  HGB 8.9  [name] [name] A [name] [name] 03/27/2022, 3:56 PM

## 2022-03-27 NOTE — Anesthesia Preprocedure Evaluation (Signed)
Anesthesia Evaluation  Patient identified by MRN, date of birth, ID band Patient awake    Reviewed: Allergy & Precautions, Patient's Chart, lab work & pertinent test results  Airway Mallampati: I       Dental no notable dental hx.    Pulmonary    Pulmonary exam normal        Cardiovascular negative cardio ROS Normal cardiovascular exam     Neuro/Psych  Headaches PSYCHIATRIC DISORDERS Anxiety Depression       GI/Hepatic ,GERD  ,,  Endo/Other    Renal/GU      Musculoskeletal   Abdominal   Peds  Hematology   Anesthesia Other Findings   Reproductive/Obstetrics (+) Pregnancy                             Anesthesia Physical Anesthesia Plan  ASA: 2  Anesthesia Plan: Epidural   Post-op Pain Management:    Induction:   PONV Risk Score and Plan: 0  Airway Management Planned: Natural Airway  Additional Equipment: None  Intra-op Plan:   Post-operative Plan:   Informed Consent: I have reviewed the patients History and Physical, chart, labs and discussed the procedure including the risks, benefits and alternatives for the proposed anesthesia with the patient or authorized representative who has indicated his/her understanding and acceptance.       Plan Discussed with:   Anesthesia Plan Comments: (Lab Results      Component                Value               Date                      WBC                      10.2                03/27/2022                HGB                      8.8 (L)             03/27/2022                HCT                      27.7 (L)            03/27/2022                MCV                      80.1                03/27/2022                PLT                      215                 03/27/2022           )       Anesthesia Quick Evaluation

## 2022-03-27 NOTE — Anesthesia Procedure Notes (Signed)
Epidural Patient location during procedure: OB Start time: 03/27/2022 12:15 PM End time: 03/27/2022 12:20 PM  Staffing Anesthesiologist: Effie Berkshire, MD Performed: anesthesiologist   Preanesthetic Checklist Completed: patient identified, IV checked, site marked, risks and benefits discussed, surgical consent, monitors and equipment checked, pre-op evaluation and timeout performed  Epidural Patient position: sitting Prep: DuraPrep Patient monitoring: heart rate, continuous pulse ox and blood pressure Approach: midline Location: L3-L4 Injection technique: LOR saline  Needle:  Needle type: Tuohy  Needle gauge: 17 G Needle length: 9 cm Catheter type: closed end flexible Catheter size: 20 Guage Test dose: negative and 1.5% lidocaine  Assessment Events: blood not aspirated, no cerebrospinal fluid, injection not painful, no injection resistance and no paresthesia  Additional Notes LOR @ 5  Patient identified. Risks/Benefits/Options discussed with patient including but not limited to bleeding, infection, nerve damage, paralysis, failed block, incomplete pain control, headache, blood pressure changes, nausea, vomiting, reactions to medications, itching and postpartum back pain. Confirmed with bedside nurse the patient's most recent platelet count. Confirmed with patient that they are not currently taking any anticoagulation, have any bleeding history or any family history of bleeding disorders. Patient expressed understanding and wished to proceed. All questions were answered. Sterile technique was used throughout the entire procedure. Please see nursing notes for vital signs. Test dose was given through epidural catheter and negative prior to continuing to dose epidural or start infusion. Warning signs of high block given to the patient including shortness of breath, tingling/numbness in hands, complete motor block, or any concerning symptoms with instructions to call for help. Patient  was given instructions on fall risk and not to get out of bed. All questions and concerns addressed with instructions to call with any issues or inadequate analgesia.    Reason for block:procedure for pain

## 2022-03-28 ENCOUNTER — Encounter (HOSPITAL_COMMUNITY): Payer: Self-pay | Admitting: Obstetrics and Gynecology

## 2022-03-28 LAB — CBC
HCT: 27.3 % — ABNORMAL LOW (ref 36.0–46.0)
Hemoglobin: 8.4 g/dL — ABNORMAL LOW (ref 12.0–15.0)
MCH: 25.2 pg — ABNORMAL LOW (ref 26.0–34.0)
MCHC: 30.8 g/dL (ref 30.0–36.0)
MCV: 82 fL (ref 80.0–100.0)
Platelets: 212 10*3/uL (ref 150–400)
RBC: 3.33 MIL/uL — ABNORMAL LOW (ref 3.87–5.11)
RDW: 14.6 % (ref 11.5–15.5)
WBC: 12.4 10*3/uL — ABNORMAL HIGH (ref 4.0–10.5)
nRBC: 0 % (ref 0.0–0.2)

## 2022-03-28 MED ORDER — BENZOCAINE-MENTHOL 20-0.5 % EX AERO: 1.0000 | INHALATION_SPRAY | CUTANEOUS | Status: DC | PRN

## 2022-03-28 MED ORDER — SENNOSIDES-DOCUSATE SODIUM 8.6-50 MG PO TABS
2.0000 | ORAL_TABLET | Freq: Every day | ORAL | Status: DC
  Administered 2022-03-28 – 2022-03-29 (×2): 2 via ORAL
  Filled 2022-03-28 (×2): qty 2

## 2022-03-28 MED ORDER — SIMETHICONE 80 MG PO CHEW: 80.0000 mg | CHEWABLE_TABLET | ORAL | Status: DC | PRN

## 2022-03-28 MED ORDER — ACETAMINOPHEN 325 MG PO TABS
650.0000 mg | ORAL_TABLET | ORAL | Status: DC | PRN
  Administered 2022-03-28 (×2): 650 mg via ORAL
  Filled 2022-03-28 (×2): qty 2

## 2022-03-28 MED ORDER — RHO D IMMUNE GLOBULIN 1500 UNIT/2ML IJ SOSY
300.0000 ug | PREFILLED_SYRINGE | Freq: Once | INTRAMUSCULAR | Status: AC
  Administered 2022-03-28: 300 ug via INTRAVENOUS
  Filled 2022-03-28: qty 2

## 2022-03-28 MED ORDER — ZOLPIDEM TARTRATE 5 MG PO TABS: 5.0000 mg | ORAL_TABLET | Freq: Every evening | ORAL | Status: DC | PRN

## 2022-03-28 MED ORDER — COCONUT OIL OIL: 1.0000 | TOPICAL_OIL | Status: DC | PRN

## 2022-03-28 MED ORDER — DIBUCAINE (PERIANAL) 1 % EX OINT
1.0000 | TOPICAL_OINTMENT | CUTANEOUS | Status: DC | PRN
  Administered 2022-03-29: 1 via RECTAL
  Filled 2022-03-28: qty 28

## 2022-03-28 MED ORDER — ONDANSETRON HCL 4 MG/2ML IJ SOLN: 4.0000 mg | INTRAMUSCULAR | Status: DC | PRN

## 2022-03-28 MED ORDER — ONDANSETRON HCL 4 MG PO TABS: 4.0000 mg | ORAL_TABLET | ORAL | Status: DC | PRN

## 2022-03-28 MED ORDER — WITCH HAZEL-GLYCERIN EX PADS
1.0000 | MEDICATED_PAD | CUTANEOUS | Status: DC | PRN
  Administered 2022-03-29: 1 via TOPICAL

## 2022-03-28 MED ORDER — DIPHENHYDRAMINE HCL 25 MG PO CAPS
25.0000 mg | ORAL_CAPSULE | Freq: Four times a day (QID) | ORAL | Status: DC | PRN
  Administered 2022-03-28: 25 mg via ORAL
  Filled 2022-03-28: qty 1

## 2022-03-28 MED ORDER — PRENATAL MULTIVITAMIN CH
1.0000 | ORAL_TABLET | Freq: Every day | ORAL | Status: DC
  Administered 2022-03-28 – 2022-03-29 (×2): 1 via ORAL
  Filled 2022-03-28 (×2): qty 1

## 2022-03-28 MED ORDER — IRON SUCROSE 500 MG IVPB - SIMPLE MED
500.0000 mg | Freq: Once | INTRAVENOUS | Status: AC
  Administered 2022-03-28: 500 mg via INTRAVENOUS
  Filled 2022-03-28: qty 275

## 2022-03-28 MED ORDER — IBUPROFEN 600 MG PO TABS
600.0000 mg | ORAL_TABLET | Freq: Four times a day (QID) | ORAL | Status: DC
  Administered 2022-03-28 – 2022-03-29 (×7): 600 mg via ORAL
  Filled 2022-03-28 (×7): qty 1

## 2022-03-28 MED ORDER — IRON SUCROSE 500 MG IVPB - SIMPLE MED: 500.0000 mg | Freq: Once | INTRAVENOUS | Status: DC

## 2022-03-28 NOTE — Anesthesia Postprocedure Evaluation (Signed)
Anesthesia Post Note  Patient: [name] [name]  Procedure(s) Performed: AN AD HOC LABOR EPIDURAL     Patient location during evaluation: Mother Baby Anesthesia Type: Epidural Level of consciousness: awake and alert and oriented Pain management: satisfactory to patient Vital Signs Assessment: post-procedure vital signs reviewed and stable Respiratory status: respiratory function stable Cardiovascular status: stable Postop Assessment: no headache, no backache, epidural receding, patient able to bend at knees, no signs of nausea or vomiting, adequate PO intake and able to ambulate Anesthetic complications: no   No notable events documented.  Last Vitals:  Vitals:   03/28/22 0004 03/28/22 0431  BP: 111/66 116/66  Pulse: 86 83  Resp:  16  Temp: 37.3 C 37.1 C  SpO2: 97% 99%    Last Pain:  Vitals:   03/28/22 0431  TempSrc: Oral  PainSc: 5    Pain Goal:                   Jadwiga Faidley

## 2022-03-28 NOTE — Lactation Note (Signed)
Lactation Consultation Note  Patient Name: [name] [name] [id number] Date: 03/28/2022 Age:37 y.o.  Mom formula feeding.   Maternal Data    Feeding    LATCH Score                    Lactation Tools Discussed/Used    Interventions    Discharge    Consult Status Consult Status: Complete    Marshia Tropea, Elta [place] 03/28/2022, 7:41 PM

## 2022-03-28 NOTE — Progress Notes (Signed)
CSW received consult for anxiety. CSW met [name] at bedside to complete mental health assessment. CSW entered room, Introduced herself and acknowledged that guest were present. [name] gave CSW verbal permission to speak about anything while FOB was present. CSW explained role and the reason for the visit. [name] presented as calm, was agreeable to consult and remained engaged throughout encounter.  CSW asked [name] about her mental health history. [name] reported during pregnancy, she experienced anxiety surrounding infants possible diagnosis of Trisomy 22. CSW asked [name] was she prescribed medication or participated in therapy to help manage symptoms. [name] reported being prescribed medication; however she only consumed 2- 3 doses before stopping the medication. [name] reported since giving birth she is happy and excited about finally adding a boy to her family.  CSW provided education regarding the baby blues period vs. perinatal mood disorders, discussed treatment and gave resources for mental health follow up if concerns arise.  CSW recommends self-evaluation during the postpartum time period using the New Mom Checklist from Postpartum Progress and encouraged [name] to contact a medical professional if symptoms are noted at any time.  CSW assessed for safety with [name] SI/HI; [name] denied SI/HI. CSW did not assess for DV; FOB was present.  CSW asked [name] has she chosen a pediatrician for infant to attend follow up visits; [name] said [facility]. [name] reported having all needed items for infant including a car seat and bassinet for safe sleeping once discharged.  CSW identifies no further need for intervention and no barriers to discharge at this time.  Lorrine Kin, [place] Social Worker [phone]

## 2022-03-28 NOTE — Progress Notes (Signed)
PPD# 1 SVD w/ superficial abrasions Information for the patient's newborn:  Shelane, Verbeck [id number]  female   Baby Name Muhammed Circumcision prior to discharge   S:   Reports feeling very sore from sciatic nerve pain Tolerating PO fluid and solids No nausea or vomiting Bleeding is light, no clots Pain is mildly controlled with acetaminophen and ibuprofen (OTC) Up ad lib / ambulatory / voiding w/o difficulty Feeding: Bottle    O:   VS: BP 107/65 (BP Location: Left Arm)   Pulse 79   Temp 98.1 F (36.7 C) (Oral)   Resp 18   Ht 5\' 3"  (1.6 m)   Wt 76.7 kg   LMP 07/08/2021   SpO2 100%   Breastfeeding Unknown   BMI 29.94 kg/m   LABS:  Recent Labs    03/27/22 0944 03/28/22 0513  WBC 10.2 12.4*  HGB 8.8* 8.4*  PLT 215 212   Blood type: --/--/A NEG (03/21 0953) Rubella: Immune (09/21 0000)                      I&O: Intake/Output      03/21 0701 03/22 0700 03/22 0701 03/23 0700   Urine (mL/kg/hr) 1300    Blood 302    Total Output 1602    Net -1602           Physical Exam: Alert and oriented X3 Lungs: Clear and unlabored Heart: regular rate and rhythm / no mumurs Abdomen: soft, non-tender, non-distended  Fundus: firm, non-tender, U-2 Perineum: intact, hemorrhoids  Lochia: appropriate Extremities: trace edema, no calf pain, tenderness, or cords    A:  PPD # 1  Normal exam AMA IDA - received Venofer 03/28/22 Rh neg - received rhogam 03/28/22  P:  Routine post partum orders Anusol for hemorrhoids Pian med schedule adjusted  Anticipate D/C on 03/29/22   Plan reviewed w/ Dr. Jethro Bastos, DNP, CNM 03/28/2022, 4:53 PM

## 2022-03-29 LAB — RH IG WORKUP (INCLUDES ABO/RH)
Fetal Screen: NEGATIVE
Gestational Age(Wks): 39
Unit division: 0

## 2022-03-29 MED ORDER — OXYCODONE-ACETAMINOPHEN 5-325 MG PO TABS: 1.0000 | ORAL_TABLET | Freq: Three times a day (TID) | ORAL | 0 refills | Status: AC | PRN

## 2022-03-29 MED ORDER — IBUPROFEN 600 MG PO TABS: 600.0000 mg | ORAL_TABLET | Freq: Four times a day (QID) | ORAL | 0 refills | Status: AC

## 2022-03-29 MED ORDER — GLYCERIN (LAXATIVE) 2 G RE SUPP
1.0000 | RECTAL | Status: AC
  Administered 2022-03-29: 1 via RECTAL
  Filled 2022-03-29: qty 1

## 2022-03-29 MED ORDER — HYDROCORTISONE (PERIANAL) 2.5 % EX CREA: TOPICAL_CREAM | Freq: Four times a day (QID) | CUTANEOUS | 0 refills | Status: DC

## 2022-03-29 MED ORDER — HYDROCORTISONE (PERIANAL) 2.5 % EX CREA
TOPICAL_CREAM | Freq: Four times a day (QID) | CUTANEOUS | Status: DC
  Filled 2022-03-29: qty 28.35

## 2022-03-29 NOTE — Discharge Summary (Signed)
Postpartum Discharge Summary  Date of Service updated 03/29/22    Patient Name: [name] [name] DOB: 28-Oct-1985 MRN: [id number]  Date of admission: 03/27/2022 Delivery date:03/27/2022  Delivering provider: Drema Dallas  Date of discharge: 03/29/2022  Admitting diagnosis: Pregnancy [Z34.90] Encounter for induction of labor [Z34.90] Intrauterine pregnancy: [redacted]w[redacted]d     Secondary diagnosis:  Principal Problem:   Pregnancy Active Problems:   Hemorrhoids   Rh negative status during pregnancy   Encounter for induction of labor   Postpartum care following vaginal delivery  Additional problems: none    Discharge diagnosis: Term Pregnancy Delivered                                              Post partum procedures:rhogam Augmentation: Pitocin Complications: None  Hospital course: Induction of Labor With Vaginal Delivery   37 y.o. yo [id number] at [redacted]w[redacted]d was admitted to the hospital 03/27/2022 for induction of labor.  Indication for induction:  advanced maternal age .  Patient had an uncomplicated labor course Membrane Rupture Time/Date: 2:37 PM ,03/27/2022   Delivery Method:Vaginal, Spontaneous  Episiotomy: None  Lacerations:  None  Details of delivery can be found in separate delivery note.  Patient had a postpartum course was uncomplicated. Patient is discharged home 03/29/22.  Newborn Data: Birth date:03/27/2022  Birth time:8:22 PM  Gender:Female  Living status:Living  Apgars:9 ,9  Weight:3490 g   Magnesium Sulfate received: No BMZ received: No Rhophylac:Yes MMR:N/A IV infusion:Yes, received IV Venofer 500 mg x1 dose  Physical exam  Vitals:   03/28/22 0004 03/28/22 0431 03/28/22 1500 03/28/22 2309  BP: 111/66 116/66 107/65 110/69  Pulse: 86 83 79 83  Resp:  16 18 18   Temp: 99.2 F (37.3 C) 98.7 F (37.1 C) 98.1 F (36.7 C) 98.3 F (36.8 C)  TempSrc: Oral Oral Oral Oral  SpO2: 97% 99% 100% 98%  Weight:      Height:       General: alert, cooperative, and no  distress Lochia: appropriate Uterine Fundus: firm Incision: N/A DVT Evaluation: No evidence of DVT seen on physical exam. No cords or calf tenderness. No significant calf/ankle edema. Labs: Lab Results  Component Value Date   WBC 12.4 (H) 03/28/2022   HGB 8.4 (L) 03/28/2022   HCT 27.3 (L) 03/28/2022   MCV 82.0 03/28/2022   PLT 212 03/28/2022      Latest Ref Rng & Units 03/21/2022   11:05 AM  CMP  Glucose 70 - 99 mg/dL 86   BUN 6 - 20 mg/dL 5   Creatinine 0.44 - 1.00 mg/dL 0.41   Sodium 135 - 145 mmol/L 136   Potassium 3.5 - 5.1 mmol/L 3.8   Chloride 98 - 111 mmol/L 106   CO2 22 - 32 mmol/L 24   Calcium 8.9 - 10.3 mg/dL 8.4   Total Protein 6.5 - 8.1 g/dL 7.1   Total Bilirubin 0.3 - 1.2 mg/dL 0.9   Alkaline Phos 38 - 126 U/L 150   AST 15 - 41 U/L 13   ALT 0 - 44 U/L 6    Edinburgh Score:    03/28/2022   12:04 AM  Edinburgh Postnatal Depression Scale Screening Tool  I have been able to laugh and see the funny side of things. 0  I have looked forward with enjoyment to things. 0  I have blamed myself  unnecessarily when things went wrong. 1  I have been anxious or worried for no good reason. 1  I have felt scared or panicky for no good reason. 1  Things have been getting on top of me. 0  I have been so unhappy that I have had difficulty sleeping. 1  I have felt sad or miserable. 0  I have been so unhappy that I have been crying. 1  The thought of harming myself has occurred to me. 0  Edinburgh Postnatal Depression Scale Total 5      After visit meds:  Allergies as of 03/29/2022       Reactions   Other Anxiety, Rash   Medication for sleep given while inpatient; doesn't remember name, no matching medications listed on chart history.  Possibly ambien, lunesta, benadryl, doxylamine.        Medication List     STOP taking these medications    Bonjesta 20-20 MG Tbcr Generic drug: Doxylamine-Pyridoxine ER   promethazine 25 MG tablet Commonly known as:  PHENERGAN       TAKE these medications    acetaminophen 500 MG tablet Commonly known as: TYLENOL Take 1,000 mg by mouth every 6 (six) hours as needed.   cyclobenzaprine 10 MG tablet Commonly known as: FLEXERIL Take 10 mg by mouth 4 (four) times daily as needed.   famotidine 20 MG tablet Commonly known as: Pepcid Take 1 tablet (20 mg total) by mouth 2 (two) times daily.   ferrous sulfate 325 (65 FE) MG EC tablet Take 325 mg by mouth 3 (three) times daily with meals.   hydrocortisone 2.5 % rectal cream Commonly known as: ANUSOL-HC Place rectally 4 (four) times daily.   ibuprofen 600 MG tablet Commonly known as: ADVIL Take 1 tablet (600 mg total) by mouth every 6 (six) hours.   oxyCODONE-acetaminophen 5-325 MG tablet Commonly known as: Percocet Take 1-2 tablets by mouth every 8 (eight) hours as needed for up to 5 days for severe pain.   sertraline 50 MG tablet Commonly known as: ZOLOFT Take 1 tablet (50 mg total) by mouth at bedtime.         Discharge home in stable condition Infant Feeding:  formula Infant Disposition:home with mother Discharge instruction: per After Visit Summary and Postpartum booklet. Activity: Advance as tolerated. Pelvic rest for 6 weeks.  Diet: routine diet Anticipated Birth Control: Unsure and handouts provided Postpartum Appointment:6 weeks Additional Postpartum F/U: Postpartum Depression checkup in 2 weeks Future Appointments: Future Appointments  Date Time Provider [place]  05/06/2022  1:15 PM [facility] None  05/06/2022  1:40 PM Lincoln Brigham, PA-C [facility] None   Follow up Visit:  [facility] Follow up.   Specialty: Obstetrics and Gynecology Contact information: [address]  [place] [phone]                    03/29/2022 Arrie Eastern, CNM

## 2022-03-30 ENCOUNTER — Inpatient Hospital Stay (HOSPITAL_COMMUNITY): Payer: Medicaid Other

## 2022-03-30 LAB — BPAM RBC
Blood Product Expiration Date: 202404152359
Blood Product Expiration Date: 202404152359
Unit Type and Rh: 600
Unit Type and Rh: 600

## 2022-03-30 LAB — TYPE AND SCREEN
ABO/RH(D): A NEG
Antibody Screen: POSITIVE
Unit division: 0
Unit division: 0

## 2022-04-09 ENCOUNTER — Telehealth (HOSPITAL_COMMUNITY): Payer: Self-pay | Admitting: *Deleted

## 2022-04-09 NOTE — Telephone Encounter (Addendum)
Mom reports all is well with herself and baby. Phone disconnected.   Odis Hollingshead, [place] 04-09-2022 at 3:54pm

## 2022-04-10 ENCOUNTER — Telehealth (HOSPITAL_COMMUNITY): Payer: Self-pay

## 2022-04-10 NOTE — Telephone Encounter (Signed)
Chart review.

## 2022-05-05 ENCOUNTER — Other Ambulatory Visit: Payer: Self-pay | Admitting: Physician Assistant

## 2022-05-05 DIAGNOSIS — D509 Iron deficiency anemia, unspecified: Secondary | ICD-10-CM

## 2022-05-06 ENCOUNTER — Inpatient Hospital Stay: Payer: Medicaid Other | Admitting: Physician Assistant

## 2022-05-06 ENCOUNTER — Inpatient Hospital Stay: Payer: Medicaid Other | Attending: Physician Assistant

## 2022-06-12 ENCOUNTER — Ambulatory Visit: Payer: Medicaid Other | Attending: Obstetrics and Gynecology

## 2022-06-12 ENCOUNTER — Other Ambulatory Visit: Payer: Self-pay

## 2022-06-12 DIAGNOSIS — M5459 Other low back pain: Secondary | ICD-10-CM

## 2022-06-12 DIAGNOSIS — N393 Stress incontinence (female) (male): Secondary | ICD-10-CM | POA: Diagnosis present

## 2022-06-12 DIAGNOSIS — M6281 Muscle weakness (generalized): Secondary | ICD-10-CM

## 2022-06-12 DIAGNOSIS — R279 Unspecified lack of coordination: Secondary | ICD-10-CM

## 2022-06-12 DIAGNOSIS — M62838 Other muscle spasm: Secondary | ICD-10-CM

## 2022-06-12 NOTE — Therapy (Signed)
OUTPATIENT PHYSICAL THERAPY FEMALE PELVIC EVALUATION   Patient Name: [name] [name] MRN: [id number] DOB:[birth year], 38 y.o., female Today's Date: 06/12/2022  END OF SESSION:  PT End of Session - 06/12/22 1448     Visit Number 1    Date for PT Re-Evaluation 11/27/22    Authorization Type [employer]    PT Start Time 1448    PT Stop Time 1525    PT Time Calculation (min) 37 min    Activity Tolerance Patient tolerated treatment well    Behavior During Therapy [facility] for tasks assessed/performed             Past Medical History:  Diagnosis Date   Anemia 2009   Anxiety    COVID-19    Exposure to x-rays 2012   normal   Family history of breast cancer    Family history of uterine cancer    H. 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   Genetic testing 04/25/2020   Family history of breast cancer    Family history of uterine cancer    Rh negative status during pregnancy 02/05/2020   Vitamin D deficiency 02/05/2020   AMA (advanced maternal age) multigravida 35+ 02/05/2020   IDA (iron deficiency anemia) 02/05/2020   Group beta Strep positive 02/05/2020   Depression 02/05/2020   Normal labor 02/04/2020   Language Barrier (Arabic) 03/14/2011   Hemorrhoids 02/19/2011    PCP: NA  REFERRING PROVIDER: Nigel Bridgeman,  CNM   REFERRING DIAG: [phone] (ICD-10-CM) - Other female genital prolapse  THERAPY DIAG:  Muscle weakness (generalized)  Other muscle spasm  Unspecified lack of coordination  Stress incontinence of urine  Other low back pain  Rationale for Evaluation and Treatment: Rehabilitation  ONSET DATE: 2 months  SUBJECTIVE:  SUBJECTIVE STATEMENT: Pt states that she feels like she is having vaginal weakness after 3 deliveries back to back. She is haing some issues with bladder control and low back pain.  Fluid intake: Yes: 6-7 cups a day of water    PAIN:  Are you having pain? Yes NPRS scale: 8/10 Pain location:  Rt sided low back pain  Pain type: aching Pain description: intermittent   Aggravating factors: lying down to sleep Relieving factors: tylenol  PRECAUTIONS: None  WEIGHT BEARING RESTRICTIONS: No  FALLS:  Has patient fallen in last 6 months? No  LIVING ENVIRONMENT: Lives with: lives with their family Lives in: House/apartment   OCCUPATION: driving  PLOF: Independent  PATIENT GOALS: feel better with intercourse, feel stronger, decrease pain  PERTINENT HISTORY:  4 vaginal deliveries  Sexual abuse: No  BOWEL MOVEMENT: Pain with bowel movement: No Type of bowel movement:Frequency 2-3x/week and Strain Yes Fully empty rectum: Yes: - Leakage: No Pads: No Fiber supplement: No  URINATION: Pain with urination: No Fully empty bladder: Yes: - Stream: Strong Urgency: No Frequency: every 2-3 hours Leakage: Walking to the bathroom and Sneezing Pads: No  INTERCOURSE: Pain with intercourse: Initial Penetration - difficulty to become lubricated Ability to have vaginal penetration:  Yes: - Climax: increased time for lubrication and climax   PREGNANCY: Vaginal deliveries  4 Tearing Yes: episitomy with last delivery C-section deliveries 0 Currently pregnant No  PROLAPSE: None   OBJECTIVE:  06/12/22:  PATIENT SURVEYS:   PFIQ-7 43  COGNITION: Overall cognitive status: Within functional limits for tasks assessed     SENSATION: Light touch: Appears intact Proprioception: Appears intact  GAIT: Comments: WNL  POSTURE: rounded shoulders, forward head, increased lumbar lordosis, and anterior pelvic tilt  FUNCTIONAL ACTIVITIES: poor bed mobility mechanics with core weakness/abdominal doming present   PALPATION:   General  tenderness throughout abdomen at midline and bil upper quadrant                External Perineal Exam WNL                             Internal Pelvic Floor tenderness/burning, perineal scar tissue restriction  Patient confirms identification and approves PT to assess internal pelvic floor and treatment Yes  PELVIC MMT:   MMT eval  Vaginal 2/5, poor coordination, 5 second endurance   Internal Anal Sphincter   External Anal Sphincter   Puborectalis   Diastasis Recti 1 finger width separation at umbilicus, small amount of abdominal distortion with increased pressure  (Blank rows = not tested)        TONE: High, Rt>LT  PROLAPSE: Grade 2 anterior vaginal wall laxity  TODAY'S TREATMENT:                                                                                                                              DATE:  06/12/22  EVAL  Neuromuscular  re-education: Pelvic floor muscle contraction training Pelvic floor bulge/relaxation training Therapeutic activities: Squatty potty Relaxed toilet mechanics Vulvovaginal massage  Check all possible CPT codes: 16109- Neuro Re-education and [phone] - Therapeutic Activities    Check all conditions that are expected to impact treatment: {Conditions expected to impact treatment:Current pregnancy or recent postpartum   If treatment provided at initial evaluation, no treatment  charged due to lack of authorization.       PATIENT EDUCATION:  Education details: See above Person educated: Patient Education method: Explanation, Demonstration, Tactile cues, Verbal cues, and Handouts Education comprehension: verbalized understanding  HOME EXERCISE PROGRAM: [id number]  ASSESSMENT:  CLINICAL IMPRESSION: Patient is a 37 y.o. female who was seen today for physical therapy evaluation and treatment for urinary incontinence, low back pain, dyspareunia, constipation, and sensation of pelvic floor weakness. Exam findings notable for poor bed mobility, abnormal posture, core weakness/abdominal distortion, pelvic floor weakness 2/5 with poor coordination of contraction, decreased pelvic floor endurance, increased pelvic floor tone (Rt>Lt - likely linked to greater Rt sided low back pain), anterior vaginal wall laxity, and burning/tenderness throughout pelvic floor. Signs and symptoms are consistent with pelvic floor weakness and poor coordination of deep core musculature/pressure management, consistent with being 2 months postpartum after 4th vaginal delivery. Initial treatment included pelvic floor muscle contraction and relaxation, education on pressure management, toilet mechanics, and vulvovaginal massage. She will continue to benefit from skilled PT intervention in order to decrease pain, improve urinary/bowel function, decrease dyspareunia, and begin functional strengthening program.   OBJECTIVE IMPAIRMENTS: decreased activity tolerance, decreased coordination, decreased endurance, decreased strength, increased fascial restrictions, increased muscle spasms, impaired tone, postural dysfunction, and pain.   ACTIVITY LIMITATIONS: bending, squatting, transfers, bed mobility, and continence  PARTICIPATION LIMITATIONS: interpersonal relationship and community activity  PERSONAL FACTORS: 1 comorbidity: 4 vaginal deliveries   are also affecting patient's functional outcome.   REHAB  POTENTIAL: Good  CLINICAL DECISION MAKING: Stable/uncomplicated  EVALUATION COMPLEXITY: Low   GOALS: Goals reviewed with patient? Yes  SHORT TERM GOALS: Target date: 06/17/22  Pt will be independent with HEP.   Baseline: Goal status: INITIAL  2.  Pt will be independent with use of squatty potty, relaxed toileting mechanics, and improved bowel movement techniques in order to increase ease of bowel movements and complete evacuation.   Baseline:  Goal status: INITIAL  3.  Pt will report reduction of low back pain to no greater than 6/10 with lying down at night. Baseline:  Goal status: INITIAL  4.  Pt will reduce PFIQ-7 score to 25 in order to demonstrate improved functional ability. Baseline:  Goal status: INITIAL  5.  Pt will be able to correctly perform diaphragmatic breathing and appropriate pressure management in order to prevent worsening vaginal wall laxity and improve pelvic floor A/ROM.   Baseline:  Goal status: INITIAL   LONG TERM GOALS: Target date: 11/27/2022  Pt will be independent with advanced HEP.   Baseline:  Goal status: INITIAL  2.  Pt will demonstrate normal pelvic floor muscle tone and A/ROM, able to achieve 4/5 strength with contractions and 10 sec endurance, in order to provide appropriate lumbopelvic support in functional activities.   Baseline:  Goal status: INITIAL  3.  Pt will report 0/10 pain with vaginal penetration in order to improve intimate relationship with partner.    Baseline:  Goal status: INITIAL  4.  Pt will have 4 or greater bowel movements a week without straining. Baseline:  Goal status: INITIAL  5.  Pt will  report no leaks with laughing, coughing, sneezing, or walking to the bathroom in order to improve comfort with interpersonal relationships and community activities.   Baseline:  Goal status: INITIAL   PLAN:  PT FREQUENCY: 1-2x/week  PT DURATION: 6 months  PLANNED INTERVENTIONS: Therapeutic exercises,  Therapeutic activity, Neuromuscular re-education, Balance training, Gait training, Patient/Family education, Self Care, Joint mobilization, Dry Needling, Biofeedback, and Manual therapy  PLAN FOR NEXT SESSION: Revisit internal pelvic floor assessment to see if relaxation has improved; internal pelvic floor muscle release to tolerance; pelvic floor contraction training; mobility/down training exercises; manual techniques to Rt low back to improve pain.    Julio Alm, PT, DPT06/06/244:00 PM

## 2022-06-12 NOTE — Patient Instructions (Signed)
Squatty potty: When your knees are level or below the level of your hips, pelvic floor muscles are pressed against rectum, preventing ease of bowel movement. By getting knees above the level of the hips, these pelvic floor muscles relax, allowing easier passage of bowel movement. ? Ways to get knees above hips: o Squatty Potty (7inch and 9inch versions) o Small stool o Roll of toilet paper under each foot o Hardback book or stack of magazines under each foot  Relaxed Toileting mechanics: Once in this position, make sure to lean forward with forearms on thighs, wide knees, relaxed stomach, and breathe.    Vulvar/vaginal Massage: This is a technique to help decrease painful sensitivity in the vaginal area. It can also help to restore normal moisture levels in the vaginal tissues. With coconut oil, aloe, jojoba oil, or a specific vaginal moisturizer, gently massage into vaginal tissues. Think of this as part of your post-shower routine and moisturizing just like you would the rest of the body with lotion. This helps to increase good blood flow to the vaginal tissues. In addition, it also teaches the body that touch to the vagina does not have to be painful or threatening, but moisturizing and gentle.   [facility] [address] [place], [place] [address] Phone # [phone] Fax [phone]

## 2022-07-01 ENCOUNTER — Ambulatory Visit: Payer: Medicaid Other

## 2022-07-01 DIAGNOSIS — N393 Stress incontinence (female) (male): Secondary | ICD-10-CM

## 2022-07-01 DIAGNOSIS — R279 Unspecified lack of coordination: Secondary | ICD-10-CM

## 2022-07-01 DIAGNOSIS — M5459 Other low back pain: Secondary | ICD-10-CM

## 2022-07-01 DIAGNOSIS — M6281 Muscle weakness (generalized): Secondary | ICD-10-CM

## 2022-07-01 DIAGNOSIS — M62838 Other muscle spasm: Secondary | ICD-10-CM

## 2022-07-01 NOTE — Therapy (Addendum)
OUTPATIENT PHYSICAL THERAPY FEMALE PELVIC TREATMENT   Patient Name: [name] [name] MRN: [id number] DOB:Jan 08, 1985, 37 y.o., female Today's Date: 07/01/2022  END OF SESSION:  PT End of Session - 07/01/22 1613     Visit Number 2    Date for PT Re-Evaluation 11/27/22    Authorization Type [employer]    Authorization Time Period 06/12/22-08/10/22    Authorization - Visit Number 1    Authorization - Number of Visits 7    PT Start Time 1615    PT Stop Time 1655    PT Time Calculation (min) 40 min    Activity Tolerance Patient tolerated treatment well    Behavior During Therapy [facility] for tasks assessed/performed              Past Medical History:  Diagnosis Date   Anemia 2009   Anxiety    COVID-19    Exposure to x-rays 2012   normal   Family history of breast cancer    Family history of uterine cancer    H. 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   Genetic testing 04/25/2020   Family history of breast cancer    Family history of uterine cancer    Rh negative status during pregnancy 02/05/2020   Vitamin D deficiency 02/05/2020   AMA (advanced maternal age) multigravida 35+ 02/05/2020   IDA (iron deficiency anemia) 02/05/2020   Group beta Strep positive 02/05/2020   Depression 02/05/2020   Normal labor 02/04/2020    Language Barrier (Arabic) 03/14/2011   Hemorrhoids 02/19/2011    PCP: NA  REFERRING PROVIDER: Nigel Bridgeman, CNM   REFERRING DIAG: N81.89 (ICD-10-CM) - Other female genital prolapse  THERAPY DIAG:  Muscle weakness (generalized)  Other muscle spasm  Unspecified lack of coordination  Stress incontinence of urine  Other low back pain  Rationale for Evaluation and Treatment: Rehabilitation  ONSET DATE: 2 months  SUBJECTIVE:  SUBJECTIVE STATEMENT: Pt has been to MD due to significant increase in low back pain and pubic pain. She was told that IUD was infected and she is now on antibiotics and linzess. She started menstrual cycle and is having some cramping, but that is the only pain that she is having.  Fluid intake: Yes: 6-7 cups a day of water    PAIN:  Are you having pain? Yes NPRS scale:0/10 Pain location:  Rt sided low back pain  Pain type: aching Pain description: intermittent   Aggravating factors: lying down to sleep Relieving factors: tylenol  PRECAUTIONS: None  WEIGHT BEARING RESTRICTIONS: No  FALLS:  Has patient fallen in last 6 months? No  LIVING ENVIRONMENT: Lives with: lives with their family Lives in: House/apartment   OCCUPATION: driving  PLOF: Independent  PATIENT GOALS: feel better with intercourse, feel stronger, decrease pain  PERTINENT HISTORY:  4 vaginal deliveries  Sexual abuse: No  BOWEL MOVEMENT: Pain with bowel movement: No Type of bowel movement:Frequency 2-3x/week and Strain Yes Fully empty rectum: Yes: - Leakage: No Pads: No Fiber supplement: No  URINATION: Pain with urination: No Fully empty bladder: Yes: - Stream: Strong Urgency: No Frequency: every 2-3 hours Leakage: Walking to the bathroom and Sneezing Pads:  No  INTERCOURSE: Pain with intercourse: Initial Penetration - difficulty to become lubricated Ability to have vaginal penetration:  Yes: - Climax: increased time for lubrication and climax   PREGNANCY: Vaginal deliveries 4 Tearing Yes: episitomy with last delivery C-section deliveries 0 Currently pregnant No  PROLAPSE: None   OBJECTIVE:  06/12/22:  PATIENT SURVEYS:   PFIQ-7 43  COGNITION: Overall cognitive status: Within functional limits for tasks assessed     SENSATION: Light touch: Appears intact Proprioception: Appears intact  GAIT: Comments: WNL  POSTURE: rounded shoulders, forward head, increased lumbar lordosis, and anterior pelvic tilt  FUNCTIONAL ACTIVITIES: poor bed mobility mechanics with core weakness/abdominal doming present   PALPATION:   General  tenderness throughout abdomen at midline and bil upper quadrant                External Perineal Exam WNL                             Internal Pelvic Floor tenderness/burning, perineal scar tissue restriction  Patient confirms identification and approves PT to assess internal pelvic floor and treatment Yes  PELVIC MMT:   MMT eval  Vaginal 2/5, poor coordination, 5 second endurance   Internal Anal Sphincter   External Anal Sphincter   Puborectalis   Diastasis Recti 1 finger width separation at umbilicus, small amount of abdominal distortion with increased pressure  (Blank rows = not tested)        TONE: High, Rt>LT  PROLAPSE: Grade 2 anterior vaginal wall laxity  TODAY'S TREATMENT:  DATE:  07/01/22 Manual: Myofascial release to abdomen Neuromuscular re-education: Diaphragmatic breathing Butterfly pose 5 minutes Child's pose 5 minutes Cat cow 2 x 10 Exercises: Piriformis stretch 60 sec bil Wide foot lower trunk rotation 2 x 10   06/12/22  EVAL  Neuromuscular  re-education: Pelvic floor muscle contraction training Pelvic floor bulge/relaxation training Therapeutic activities: Squatty potty Relaxed toilet mechanics Vulvovaginal massage  Check all possible CPT codes: 16109- Neuro Re-education and [phone] - Therapeutic Activities    Check all conditions that are expected to impact treatment: {Conditions expected to impact treatment:Current pregnancy or recent postpartum   If treatment provided at initial evaluation, no treatment charged due to lack of authorization.       PATIENT EDUCATION:  Education details: See above Person educated: Patient Education method: Explanation, Demonstration, Tactile cues, Verbal cues, and Handouts Education comprehension: verbalized understanding  HOME EXERCISE PROGRAM: [id number]  ASSESSMENT:  CLINICAL IMPRESSION: Pt has had difficult time with pain due to IUD infection, but is now feeling better. Abdominal mobilization/myofascial release performed today to help reduce tension in this area with excellent tolerance; pt reported that she could feel bil LE relax. She did very well with all down training and stretches, reporting significant improvement in Rt hip tightness. Some difficulty with lumbopelvic motor control in cat cow, but improvements with multimodal cues and repetition. She will continue to benefit from skilled PT intervention in order to decrease pain, improve urinary/bowel function, decrease dyspareunia, and begin functional strengthening program.   OBJECTIVE IMPAIRMENTS: decreased activity tolerance, decreased coordination, decreased endurance, decreased strength, increased fascial restrictions, increased muscle spasms, impaired tone, postural dysfunction, and pain.   ACTIVITY LIMITATIONS: bending, squatting, transfers, bed mobility, and continence  PARTICIPATION LIMITATIONS: interpersonal relationship and community activity  PERSONAL FACTORS: 1 comorbidity: 4 vaginal deliveries   are also  affecting patient's functional outcome.   REHAB POTENTIAL: Good  CLINICAL DECISION MAKING: Stable/uncomplicated  EVALUATION COMPLEXITY: Low   GOALS: Goals reviewed with patient? Yes  SHORT TERM GOALS: Target date: 06/17/22 - 07/01/22  Pt will be independent with HEP.   Baseline: Goal status:IN PROGRESS  2.  Pt will be independent with use of squatty potty, relaxed toileting mechanics, and improved bowel movement techniques in order to increase ease of bowel movements and complete evacuation.   Baseline:  Goal status: IN PROGRESS  3.  Pt will report reduction of low back pain to no greater than 6/10 with lying down at night. Baseline:  Goal status: IN PROGRESS  4.  Pt will reduce PFIQ-7 score to 25 in order to demonstrate improved functional ability. Baseline:  Goal status: IN PROGRESS  5.  Pt will be able to correctly perform diaphragmatic breathing and appropriate pressure management in order to prevent worsening vaginal wall laxity and improve pelvic floor A/ROM.   Baseline:  Goal status: IN PROGRESS   LONG TERM GOALS: Target date: 11/27/2022 - updated 07/01/22  Pt will be independent with advanced HEP.   Baseline:  Goal status: IN PROGRESS  2.  Pt will demonstrate normal pelvic floor muscle tone and A/ROM, able to achieve 4/5 strength with contractions and 10 sec endurance, in order to provide appropriate lumbopelvic support in functional activities.   Baseline:  Goal status: IN PROGRESS  3.  Pt will report 0/10 pain with vaginal penetration in order to improve intimate relationship with partner.    Baseline:  Goal status: IN PROGRESS  4.  Pt will have 4 or greater bowel movements a week without straining. Baseline:  Goal  status: IN PROGRESS  5.  Pt will report no leaks with laughing, coughing, sneezing, or walking to the bathroom in order to improve comfort with interpersonal relationships and community activities.   Baseline:  Goal status: IN  PROGRESS   PLAN:  PT FREQUENCY: 1-2x/week  PT DURATION: 6 months  PLANNED INTERVENTIONS: Therapeutic exercises, Therapeutic activity, Neuromuscular re-education, Balance training, Gait training, Patient/Family education, Self Care, Joint mobilization, Dry Needling, Biofeedback, and Manual therapy  PLAN FOR NEXT SESSION: Possible internal pelvic floor muscle release; manual techniques to Rt hip/low back; progress mobility/stretches.    Julio Alm, PT, DPT06/25/244:45 PM  *08/07/22 Note for authorization: Pt has not been able to return for visits since last last treatment session. She has only had 1 treatment session since initial authorization.   Julio Alm, PT, DPT08/01/2408:41 AM

## 2022-07-31 ENCOUNTER — Telehealth: Payer: Self-pay

## 2022-07-31 ENCOUNTER — Ambulatory Visit: Payer: Medicaid Other | Attending: Obstetrics and Gynecology

## 2022-07-31 DIAGNOSIS — R279 Unspecified lack of coordination: Secondary | ICD-10-CM | POA: Insufficient documentation

## 2022-07-31 DIAGNOSIS — M6281 Muscle weakness (generalized): Secondary | ICD-10-CM | POA: Insufficient documentation

## 2022-07-31 DIAGNOSIS — M62838 Other muscle spasm: Secondary | ICD-10-CM | POA: Insufficient documentation

## 2022-07-31 DIAGNOSIS — N393 Stress incontinence (female) (male): Secondary | ICD-10-CM | POA: Insufficient documentation

## 2022-07-31 DIAGNOSIS — M5459 Other low back pain: Secondary | ICD-10-CM | POA: Insufficient documentation

## 2022-07-31 NOTE — Telephone Encounter (Signed)
Pt had no-show appointment 07/31/22 at 2:45. I called and spoke with her; she states that she will be at her appointment Monday 08/04/22 at 2:00pm.

## 2022-08-04 ENCOUNTER — Ambulatory Visit: Payer: Medicaid Other

## 2022-08-11 ENCOUNTER — Ambulatory Visit: Payer: Medicaid Other | Attending: Obstetrics and Gynecology

## 2022-08-11 DIAGNOSIS — R279 Unspecified lack of coordination: Secondary | ICD-10-CM | POA: Diagnosis present

## 2022-08-11 DIAGNOSIS — N393 Stress incontinence (female) (male): Secondary | ICD-10-CM | POA: Diagnosis present

## 2022-08-11 DIAGNOSIS — M62838 Other muscle spasm: Secondary | ICD-10-CM | POA: Insufficient documentation

## 2022-08-11 DIAGNOSIS — M6281 Muscle weakness (generalized): Secondary | ICD-10-CM | POA: Insufficient documentation

## 2022-08-11 DIAGNOSIS — M5459 Other low back pain: Secondary | ICD-10-CM | POA: Insufficient documentation

## 2022-08-11 NOTE — Therapy (Signed)
OUTPATIENT PHYSICAL THERAPY FEMALE PELVIC TREATMENT   Patient Name: [name] [name] MRN: [id number] DOB:1985-10-17, 37 y.o., female Today's Date: 08/11/2022  END OF SESSION:  PT End of Session - 08/11/22 1351     Visit Number 3    Date for PT Re-Evaluation 11/27/22    Authorization Type [employer]    Authorization Time Period 08/11/2022-09/09/2022    Authorization - Visit Number 1    Authorization - Number of Visits 4    PT Start Time 0200    PT Stop Time 0240    PT Time Calculation (min) 40 min    Activity Tolerance Patient tolerated treatment well    Behavior During Therapy [facility] for tasks assessed/performed               Past Medical History:  Diagnosis Date   Anemia 2009   Anxiety    COVID-19    Exposure to x-rays 2012   normal   Family history of breast cancer    Family history of uterine cancer    H. 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   Genetic testing 04/25/2020   Family history of breast cancer    Family history of uterine cancer    Rh negative status during pregnancy 02/05/2020   Vitamin D deficiency 02/05/2020   AMA (advanced maternal age) multigravida 35+ 02/05/2020   IDA (iron deficiency anemia) 02/05/2020   Group beta Strep positive 02/05/2020   Depression 02/05/2020   Normal labor  02/04/2020   Language Barrier (Arabic) 03/14/2011   Hemorrhoids 02/19/2011    PCP: NA  REFERRING PROVIDER: Nigel Bridgeman, CNM   REFERRING DIAG: N81.89 (ICD-10-CM) - Other female genital prolapse  THERAPY DIAG:  Muscle weakness (generalized)  Other muscle spasm  Unspecified lack of coordination  Stress incontinence of urine  Other low back pain  Rationale for Evaluation and Treatment: Rehabilitation  ONSET DATE: 2 months  SUBJECTIVE:  SUBJECTIVE STATEMENT: Pt states that she has had very stressful couple of weeks after daughter was taken at a park. Daughter is home and safe, but now she states that she has been struggling with severe depression. She is now feeling better. She states that she is having severe pain in Rt side.    PAIN:  Are you having pain? Yes NPRS scale:6/10 Pain location:  Rt sided low back pain  Pain type: aching Pain description: intermittent   Aggravating factors: lying down to sleep Relieving factors: tylenol  PRECAUTIONS: None  WEIGHT BEARING RESTRICTIONS: No  FALLS:  Has patient fallen in last 6 months? No  LIVING ENVIRONMENT: Lives with: lives with their family Lives in: House/apartment   OCCUPATION: driving  PLOF: Independent  PATIENT GOALS: feel better with intercourse, feel stronger, decrease pain  PERTINENT HISTORY:  4 vaginal deliveries  Sexual abuse: No  BOWEL MOVEMENT: Pain with bowel movement: No Type of bowel movement:Frequency 2-3x/week and Strain Yes Fully empty rectum: Yes: - Leakage: No Pads: No Fiber supplement: No  URINATION: Pain with urination: No Fully empty bladder: Yes: - Stream: Strong Urgency: No Frequency: every 2-3 hours Leakage: Walking to the bathroom and Sneezing Pads: No  INTERCOURSE: Pain with  intercourse: Initial Penetration - difficulty to become lubricated Ability to have vaginal penetration:  Yes: - Climax: increased time for lubrication and climax   PREGNANCY: Vaginal deliveries 4 Tearing Yes: episitomy with last delivery C-section deliveries 0 Currently pregnant No  PROLAPSE: None   OBJECTIVE:  06/12/22:  PATIENT SURVEYS:   PFIQ-7 43  COGNITION: Overall cognitive status: Within functional limits for tasks assessed     SENSATION: Light touch: Appears intact Proprioception: Appears intact  GAIT: Comments: WNL  POSTURE: rounded shoulders, forward head, increased lumbar lordosis, and anterior pelvic tilt  FUNCTIONAL ACTIVITIES: poor bed mobility mechanics with core weakness/abdominal doming present   PALPATION:   General  tenderness throughout abdomen at midline and bil upper quadrant                External Perineal Exam WNL                             Internal Pelvic Floor tenderness/burning, perineal scar tissue restriction  Patient confirms identification and approves PT to assess internal pelvic floor and treatment Yes  PELVIC MMT:   MMT eval  Vaginal 2/5, poor coordination, 5 second endurance   Internal Anal Sphincter   External Anal Sphincter   Puborectalis   Diastasis Recti 1 finger width separation at umbilicus, small amount of abdominal distortion with increased pressure  (Blank rows = not tested)        TONE: High, Rt>LT  PROLAPSE: Grade 2 anterior vaginal wall laxity  TODAY'S TREATMENT:  DATE:  08/11/22 Manual: Rt sided lower abdomen, flank, low back Soft tissue mobilization  Exercises: Lower trunk rotation 2 x 10 Open books 10x bil Standing side body doorway stretch 2 x 60 sec bil Seated lateral flexion over peanut 2 min bil Seated Pt stretch 2 min bil   07/01/22 Manual: Myofascial release to  abdomen Neuromuscular re-education: Diaphragmatic breathing Butterfly pose 5 minutes Child's pose 5 minutes Cat cow 2 x 10 Exercises: Piriformis stretch 60 sec bil Wide foot lower trunk rotation 2 x 10   06/12/22  EVAL  Neuromuscular re-education: Pelvic floor muscle contraction training Pelvic floor bulge/relaxation training Therapeutic activities: Squatty potty Relaxed toilet mechanics Vulvovaginal massage  Check all possible CPT codes: 16109- Neuro Re-education and [phone] - Therapeutic Activities    Check all conditions that are expected to impact treatment: {Conditions expected to impact treatment:Current pregnancy or recent postpartum   If treatment provided at initial evaluation, no treatment charged due to lack of authorization.       PATIENT EDUCATION:  Education details: See above Person educated: Patient Education method: Explanation, Demonstration, Tactile cues, Verbal cues, and Handouts Education comprehension: verbalized understanding  HOME EXERCISE PROGRAM: [id number]  ASSESSMENT:  CLINICAL IMPRESSION: Pt has had a very difficult couple of weeks and therefore has been in significant pain. Manual techniques performed to Rt side to help decrease discomfort. Significant trigger points and myofascial restriction present throughout QL/obliques on the Rt with some reduction; we discussed dry needling and patient is willing to consider, but she does not like needles. Good tolerance to all mobility exercises. We will plan to begin core strengthening next session. She will continue to benefit from skilled PT intervention in order to decrease pain, improve urinary/bowel function, decrease dyspareunia, and begin functional strengthening program.   OBJECTIVE IMPAIRMENTS: decreased activity tolerance, decreased coordination, decreased endurance, decreased strength, increased fascial restrictions, increased muscle spasms, impaired tone, postural dysfunction, and pain.    ACTIVITY LIMITATIONS: bending, squatting, transfers, bed mobility, and continence  PARTICIPATION LIMITATIONS: interpersonal relationship and community activity  PERSONAL FACTORS: 1 comorbidity: 4 vaginal deliveries   are also affecting patient's functional outcome.   REHAB POTENTIAL: Good  CLINICAL DECISION MAKING: Stable/uncomplicated  EVALUATION COMPLEXITY: Low   GOALS: Goals reviewed with patient? Yes  SHORT TERM GOALS: Target date: 06/17/22 - 07/01/22 - 07/01/22 - updated 08/11/22  Pt will be independent with HEP.   Baseline: Goal status: MET 07/31/22  2.  Pt will be independent with use of squatty potty, relaxed toileting mechanics, and improved bowel movement techniques in order to increase ease of bowel movements and complete evacuation.   Baseline:  Goal status: MET 07/31/22  3.  Pt will report reduction of low back pain to no greater than 6/10 with lying down at night. Baseline:  Goal status: IN PROGRESS  4.  Pt will reduce PFIQ-7 score to 25 in order to demonstrate improved functional ability. Baseline:  Goal status: IN PROGRESS  5.  Pt will be able to correctly perform diaphragmatic breathing and appropriate pressure management in order to prevent worsening vaginal wall laxity and improve pelvic floor A/ROM.   Baseline:  Goal status: MET 07/31/22   LONG TERM GOALS: Target date: 11/27/2022 - updated 07/01/22 - updated 08/11/22  Pt will be independent with advanced HEP.   Baseline:  Goal status: IN PROGRESS  2.  Pt will demonstrate normal pelvic floor muscle tone and A/ROM, able to achieve 4/5 strength with contractions and 10 sec endurance, in order to provide appropriate lumbopelvic  support in functional activities.   Baseline:  Goal status: IN PROGRESS  3.  Pt will report 0/10 pain with vaginal penetration in order to improve intimate relationship with partner.    Baseline:  Goal status: IN PROGRESS  4.  Pt will have 4 or greater bowel movements a week  without straining. Baseline:  Goal status: IN PROGRESS  5.  Pt will report no leaks with laughing, coughing, sneezing, or walking to the bathroom in order to improve comfort with interpersonal relationships and community activities.   Baseline:  Goal status: IN PROGRESS   PLAN:  PT FREQUENCY: 1-2x/week  PT DURATION: 6 months  PLANNED INTERVENTIONS: Therapeutic exercises, Therapeutic activity, Neuromuscular re-education, Balance training, Gait training, Patient/Family education, Self Care, Joint mobilization, Dry Needling, Biofeedback, and Manual therapy  PLAN FOR NEXT SESSION: Plan to begin core strengthening, progress mobility, manual techniques as needed.    Julio Alm, PT, DPT08/05/242:42 PM

## 2022-09-19 ENCOUNTER — Emergency Department (HOSPITAL_COMMUNITY)
Admission: EM | Admit: 2022-09-19 | Discharge: 2022-09-19 | Disposition: A | Discharge status: Home / Self Care | Payer: Medicaid Other | Attending: Emergency Medicine | Admitting: Emergency Medicine

## 2022-09-19 ENCOUNTER — Other Ambulatory Visit: Payer: Self-pay

## 2022-09-19 ENCOUNTER — Emergency Department (HOSPITAL_COMMUNITY): Payer: Medicaid Other

## 2022-09-19 ENCOUNTER — Encounter (HOSPITAL_COMMUNITY): Payer: Self-pay

## 2022-09-19 DIAGNOSIS — B349 Viral infection, unspecified: Secondary | ICD-10-CM | POA: Diagnosis not present

## 2022-09-19 DIAGNOSIS — Z1152 Encounter for screening for COVID-19: Secondary | ICD-10-CM | POA: Insufficient documentation

## 2022-09-19 DIAGNOSIS — R509 Fever, unspecified: Secondary | ICD-10-CM | POA: Diagnosis present

## 2022-09-19 LAB — PREGNANCY, URINE: Preg Test, Ur: NEGATIVE

## 2022-09-19 LAB — COMPREHENSIVE METABOLIC PANEL
ALT: 15 U/L (ref 0–44)
AST: 17 U/L (ref 15–41)
Albumin: 4.1 g/dL (ref 3.5–5.0)
Alkaline Phosphatase: 55 U/L (ref 38–126)
Anion gap: 8 (ref 5–15)
BUN: 8 mg/dL (ref 6–20)
CO2: 22 mmol/L (ref 22–32)
Calcium: 8.6 mg/dL — ABNORMAL LOW (ref 8.9–10.3)
Chloride: 104 mmol/L (ref 98–111)
Creatinine, Ser: 0.43 mg/dL — ABNORMAL LOW (ref 0.44–1.00)
GFR, Estimated: >60 mL/min (ref >60)
Glucose, Bld: 117 mg/dL — ABNORMAL HIGH (ref 70–99)
Potassium: 3.4 mmol/L — ABNORMAL LOW (ref 3.5–5.1)
Sodium: 134 mmol/L — ABNORMAL LOW (ref 135–145)
Total Bilirubin: 1.7 mg/dL — ABNORMAL HIGH (ref 0.3–1.2)
Total Protein: 7.8 g/dL (ref 6.5–8.1)

## 2022-09-19 LAB — CBC WITH DIFFERENTIAL/PLATELET
Abs Immature Granulocytes: 0.01 10*3/uL (ref 0.00–0.07)
Basophils Absolute: 0 10*3/uL (ref 0.0–0.1)
Basophils Relative: 0 %
Eosinophils Absolute: 0 10*3/uL (ref 0.0–0.5)
Eosinophils Relative: 0 %
HCT: 38.9 % (ref 36.0–46.0)
Hemoglobin: 12.6 g/dL (ref 12.0–15.0)
Immature Granulocytes: 0 %
Lymphocytes Relative: 9 %
Lymphs Abs: 0.5 10*3/uL — ABNORMAL LOW (ref 0.7–4.0)
MCH: 27.8 pg (ref 26.0–34.0)
MCHC: 32.4 g/dL (ref 30.0–36.0)
MCV: 85.7 fL (ref 80.0–100.0)
Monocytes Absolute: 0.2 10*3/uL (ref 0.1–1.0)
Monocytes Relative: 4 %
Neutro Abs: 5.2 10*3/uL (ref 1.7–7.7)
Neutrophils Relative %: 87 %
Platelets: 173 10*3/uL (ref 150–400)
RBC: 4.54 MIL/uL (ref 3.87–5.11)
RDW: 13.2 % (ref 11.5–15.5)
WBC: 6 10*3/uL (ref 4.0–10.5)
nRBC: 0 % (ref 0.0–0.2)

## 2022-09-19 LAB — D-DIMER, QUANTITATIVE: D-Dimer, Quant: 0.74 ug{FEU}/mL — ABNORMAL HIGH (ref 0.00–0.50)

## 2022-09-19 LAB — URINALYSIS, ROUTINE W REFLEX MICROSCOPIC
Bacteria, UA: NONE SEEN
Bilirubin Urine: NEGATIVE
Glucose, UA: NEGATIVE mg/dL
Ketones, ur: NEGATIVE mg/dL
Leukocytes,Ua: NEGATIVE
Nitrite: NEGATIVE
Protein, ur: NEGATIVE mg/dL
Specific Gravity, Urine: 1.017 (ref 1.005–1.030)
pH: 5 (ref 5.0–8.0)

## 2022-09-19 LAB — SARS CORONAVIRUS 2 BY RT PCR: SARS Coronavirus 2 by RT PCR: NEGATIVE

## 2022-09-19 LAB — TROPONIN I (HIGH SENSITIVITY)
Troponin I (High Sensitivity): <2 ng/L (ref <18)
Troponin I (High Sensitivity): <2 ng/L (ref <18)

## 2022-09-19 LAB — GROUP A STREP BY PCR: Group A Strep by PCR: NOT DETECTED

## 2022-09-19 LAB — LIPASE, BLOOD: Lipase: 25 U/L (ref 11–51)

## 2022-09-19 MED ORDER — IOHEXOL 350 MG/ML SOLN
75.0000 mL | Freq: Once | INTRAVENOUS | Status: AC | PRN
  Administered 2022-09-19: 75 mL via INTRAVENOUS

## 2022-09-19 MED ORDER — LACTATED RINGERS IV SOLN: INTRAVENOUS | Status: DC

## 2022-09-19 MED ORDER — LACTATED RINGERS IV BOLUS
1000.0000 mL | Freq: Once | INTRAVENOUS | Status: AC
  Administered 2022-09-19: 1000 mL via INTRAVENOUS

## 2022-09-19 MED ORDER — KETOROLAC TROMETHAMINE 30 MG/ML IJ SOLN
15.0000 mg | Freq: Once | INTRAMUSCULAR | Status: AC
  Administered 2022-09-19: 15 mg via INTRAVENOUS
  Filled 2022-09-19: qty 1

## 2022-09-19 MED ORDER — MORPHINE SULFATE (PF) 4 MG/ML IV SOLN
4.0000 mg | Freq: Once | INTRAVENOUS | Status: AC
  Administered 2022-09-19: 4 mg via INTRAVENOUS
  Filled 2022-09-19: qty 1

## 2022-09-19 MED ORDER — PANTOPRAZOLE SODIUM 40 MG IV SOLR
40.0000 mg | Freq: Once | INTRAVENOUS | Status: AC
  Administered 2022-09-19: 40 mg via INTRAVENOUS
  Filled 2022-09-19: qty 10

## 2022-09-19 MED ORDER — ONDANSETRON 4 MG PO TBDP
4.0000 mg | ORAL_TABLET | Freq: Once | ORAL | Status: AC
  Administered 2022-09-19: 4 mg via ORAL
  Filled 2022-09-19: qty 1

## 2022-09-19 MED ORDER — OXYCODONE-ACETAMINOPHEN 5-325 MG PO TABS
1.0000 | ORAL_TABLET | Freq: Four times a day (QID) | ORAL | 0 refills | Status: DC | PRN
Start: 2022-09-19 — End: 2023-06-15

## 2022-09-19 MED ORDER — OXYCODONE-ACETAMINOPHEN 5-325 MG PO TABS
1.0000 | ORAL_TABLET | Freq: Once | ORAL | Status: AC
  Administered 2022-09-19: 1 via ORAL
  Filled 2022-09-19: qty 1

## 2022-09-19 NOTE — ED Provider Notes (Signed)
[facility] EMERGENCY DEPARTMENT AT [facility] Provider Note   CSN: [id number] Arrival date & time: 09/19/22  1525     History  Chief Complaint  Patient presents with   Chest Pain    [name] [name] is a 37 y.o. female.  37 year old female who presents with 48 hours of myalgias, back pain, and fever.  Denies sick exposures.  She has had emesis with slight diarrhea.  No urinary symptoms.  Denies being severely short of breath.  Endorses URI symptoms.  Unsure how high her fever has been.  Denies any neck pain or photophobia but does have mild cephalgia.  Unresponsive to home medications       Home Medications Prior to Admission medications   Medication Sig Start Date End Date Taking? Authorizing Provider  acetaminophen (TYLENOL) 500 MG tablet Take 1,000 mg by mouth every 6 (six) hours as needed.    [provider]  cyclobenzaprine (FLEXERIL) 10 MG tablet Take 10 mg by mouth 4 (four) times daily as needed.    [provider]  famotidine (PEPCID) 20 MG tablet Take 1 tablet (20 mg total) by mouth 2 (two) times daily. 08/15/21   Leftwich-Kirby, Wilmer Floor, CNM  ferrous sulfate 325 (65 FE) MG EC tablet Take 325 mg by mouth 3 (three) times daily with meals.    [provider]  hydrocortisone (ANUSOL-HC) 2.5 % rectal cream Place rectally 4 (four) times daily. 03/29/22   Roma Schanz, CNM  ibuprofen (ADVIL) 600 MG tablet Take 1 tablet (600 mg total) by mouth every 6 (six) hours. 03/29/22   Roma Schanz, CNM  sertraline (ZOLOFT) 50 MG tablet Take 1 tablet (50 mg total) by mouth at bedtime. 02/06/20 03/17/22  Roma Schanz, CNM      Allergies    Other    Review of Systems   Review of Systems  All other systems reviewed and are negative.   Physical Exam Updated Vital Signs BP 131/88 (BP Location: Right Arm)   Pulse (!) 116   Temp 98.9 F (37.2 C) (Oral)   Resp 18   Ht 1.6 m (5\' 3" )   Wt 77.1 kg   LMP 09/19/2022   SpO2 100%   BMI 30.11  kg/m  Physical Exam Vitals and nursing note reviewed.  Constitutional:      General: She is not in acute distress.    Appearance: Normal appearance. She is well-developed. She is not toxic-appearing.  HENT:     Head: Normocephalic and atraumatic.  Eyes:     General: Lids are normal.     Conjunctiva/sclera: Conjunctivae normal.     Pupils: Pupils are equal, round, and reactive to light.  Neck:     Thyroid: No thyroid mass.     Trachea: No tracheal deviation.  Cardiovascular:     Rate and Rhythm: Normal rate and regular rhythm.     Heart sounds: Normal heart sounds. No murmur heard.    No gallop.  Pulmonary:     Effort: Pulmonary effort is normal. No respiratory distress.     Breath sounds: Normal breath sounds. No stridor. No decreased breath sounds, wheezing, rhonchi or rales.  Abdominal:     General: There is no distension.     Palpations: Abdomen is soft.     Tenderness: There is abdominal tenderness in the epigastric area. There is no rebound.  Musculoskeletal:        General: No tenderness. Normal range of motion.  Cervical back: Normal range of motion and neck supple.  Skin:    General: Skin is warm and dry.     Findings: No abrasion or rash.  Neurological:     Mental Status: She is alert and oriented to person, place, and time. Mental status is at baseline.     GCS: GCS eye subscore is 4. GCS verbal subscore is 5. GCS motor subscore is 6.     Cranial Nerves: Cranial nerves are intact. No cranial nerve deficit.     Sensory: No sensory deficit.     Motor: Motor function is intact.  Psychiatric:        Attention and Perception: Attention normal.        Speech: Speech normal.        Behavior: Behavior normal.     ED Results / Procedures / Treatments   Labs (all labs ordered are listed, but only abnormal results are displayed) Labs Reviewed  CBC WITH DIFFERENTIAL/PLATELET - Abnormal; Notable for the following components:      Result Value   Lymphs Abs 0.5 (*)     All other components within normal limits  COMPREHENSIVE METABOLIC PANEL - Abnormal; Notable for the following components:   Sodium 134 (*)    Potassium 3.4 (*)    Glucose, Bld 117 (*)    Creatinine, Ser 0.43 (*)    Calcium 8.6 (*)    Total Bilirubin 1.7 (*)    All other components within normal limits  SARS CORONAVIRUS 2 BY RT PCR  GROUP A STREP BY PCR  LIPASE, BLOOD  URINALYSIS, ROUTINE W REFLEX MICROSCOPIC  D-DIMER, QUANTITATIVE (NOT AT [facility])  HCG, SERUM, QUALITATIVE  TROPONIN I (HIGH SENSITIVITY)    EKG EKG Interpretation Date/Time:  Friday September 19 2022 15:38:09 EDT Ventricular Rate:  113 PR Interval:  149 QRS Duration:  94 QT Interval:  320 QTC Calculation: 439 R Axis:   50  Text Interpretation: Sinus tachycardia Nonspecific T abnormalities, diffuse leads Confirmed by Lorre Nick ([id number]) on 09/19/2022 5:12:58 PM  Radiology No results found.  Procedures Procedures    Medications Ordered in ED Medications  lactated ringers bolus 1,000 mL (has no administration in time range)  lactated ringers infusion (has no administration in time range)  pantoprazole (PROTONIX) injection 40 mg (has no administration in time range)  morphine (PF) 4 MG/ML injection 4 mg (has no administration in time range)  ondansetron (ZOFRAN-ODT) disintegrating tablet 4 mg (4 mg Oral Given 09/19/22 1615)  oxyCODONE-acetaminophen (PERCOCET/ROXICET) 5-325 MG per tablet 1 tablet (1 tablet Oral Given 09/19/22 1615)    ED Course/ Medical Decision Making/ A&P                                 Medical Decision Making Amount and/or Complexity of Data Reviewed Labs: ordered. Radiology: ordered.  Risk Prescription drug management.   Patient here with diffuse myalgias.  Concern for possible COVID-19 infection.  COVID test was negative.  Did have elevated D-dimer chest CT without acute findings here.  She had a sore throat and strep test was negative.  Urinalysis negative.  CBC and c-Met  without significant abnormality.  Patient feels better at this time and will discharge        Final Clinical Impression(s) / ED Diagnoses Final diagnoses:  None    Rx / DC Orders ED Discharge Orders     None  Lorre Nick, MD 09/19/22 [phone]

## 2022-09-19 NOTE — ED Notes (Signed)
Pt complains of sore throat, generalized body aches, fatigue X2 days, worsening this morning.

## 2022-09-19 NOTE — ED Provider Triage Note (Signed)
Emergency Medicine Provider Triage Evaluation Note  [name] [name] , a 37 y.o. female  was evaluated in triage.  Pt complains of fever, vomiting. For 2 days, she has been having chills and unable to keep anything down. Had some shortness of breath as well. Also has back pain.   Review of Systems  Positive: Back pain, chills Negative:   Physical Exam  BP 131/88 (BP Location: Right Arm)   Pulse (!) 116   Temp 98.9 F (37.2 C) (Oral)   Resp 18   Ht 5\' 3"  (1.6 m)   Wt 77.1 kg   SpO2 100%   BMI 30.11 kg/m  Gen:   Awake, uncomfortable, dry heaving Resp:  Normal effort  MSK:   Moves extremities without difficulty  Other:    Medical Decision Making  Medically screening exam initiated at 3:50 PM.  Appropriate orders placed.  [facility] [name] [name] was informed that the remainder of the evaluation will be completed by another provider, this initial triage assessment does not replace that evaluation, and the importance of remaining in the ED until their evaluation is complete.  [name] [name] is a 37 y.o. female here with chills, chest pain, back pain. Patient tachycardic in the ED. Consider PE vs covid vs pneumonia vs UTI. Will get labs, CXR, UA, d-dimer     Charlynne Pander, MD 09/19/22 [phone]

## 2022-09-19 NOTE — ED Triage Notes (Signed)
Left-sided chest pain that radiates into back. N/V/D since 0400 today. Pt states she had a baby 6 months ago, no issues since delivery, states after her other children she was anemic.

## 2022-10-08 ENCOUNTER — Emergency Department (HOSPITAL_BASED_OUTPATIENT_CLINIC_OR_DEPARTMENT_OTHER): Payer: Medicaid Other

## 2022-10-08 ENCOUNTER — Other Ambulatory Visit: Payer: Self-pay

## 2022-10-08 ENCOUNTER — Emergency Department (HOSPITAL_BASED_OUTPATIENT_CLINIC_OR_DEPARTMENT_OTHER)
Admission: EM | Admit: 2022-10-08 | Discharge: 2022-10-08 | Disposition: A | Discharge status: Home / Self Care | Payer: Medicaid Other | Attending: Emergency Medicine | Admitting: Emergency Medicine

## 2022-10-08 ENCOUNTER — Encounter (HOSPITAL_BASED_OUTPATIENT_CLINIC_OR_DEPARTMENT_OTHER): Payer: Self-pay

## 2022-10-08 DIAGNOSIS — S92012A Displaced fracture of body of left calcaneus, initial encounter for closed fracture: Secondary | ICD-10-CM | POA: Diagnosis not present

## 2022-10-08 DIAGNOSIS — Z8616 Personal history of COVID-19: Secondary | ICD-10-CM | POA: Diagnosis not present

## 2022-10-08 DIAGNOSIS — S92002A Unspecified fracture of left calcaneus, initial encounter for closed fracture: Secondary | ICD-10-CM

## 2022-10-08 DIAGNOSIS — W232XXA Caught, crushed, jammed or pinched between a moving and stationary object, initial encounter: Secondary | ICD-10-CM | POA: Diagnosis not present

## 2022-10-08 DIAGNOSIS — S99912A Unspecified injury of left ankle, initial encounter: Secondary | ICD-10-CM | POA: Diagnosis present

## 2022-10-08 MED ORDER — OXYCODONE-ACETAMINOPHEN 5-325 MG PO TABS
1.0000 | ORAL_TABLET | ORAL | Status: DC | PRN
  Administered 2022-10-08: 1 via ORAL
  Filled 2022-10-08: qty 1

## 2022-10-08 MED ORDER — OXYCODONE-ACETAMINOPHEN 5-325 MG PO TABS
1.0000 | ORAL_TABLET | Freq: Four times a day (QID) | ORAL | 0 refills | Status: DC | PRN
Start: 2022-10-08 — End: 2023-06-15
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
## 2022-10-08 NOTE — Discharge Instructions (Addendum)
Please take tylenol/ibuprofen/naproxen or Percocet as needed for pain. I recommend close follow-up with podiatry/orthopedics for reevaluation.  Please do not hesitate to return to emergency department if worrisome signs symptoms we discussed become apparent.

## 2022-10-08 NOTE — ED Notes (Addendum)
Cleaned minor scrapes and cuts on pt's ankle from fall. Applied antibiotic ointment, gauze and ace wrap to pt's left foot. Ankle brace also applied for support and crutches given. Pt tolerated well and demonstrated proper use of crutches.

## 2022-11-10 ENCOUNTER — Other Ambulatory Visit: Payer: Commercial Managed Care - HMO

## 2022-11-10 ENCOUNTER — Ambulatory Visit: Payer: Commercial Managed Care - HMO

## 2023-05-04 ENCOUNTER — Other Ambulatory Visit: Payer: Self-pay

## 2023-05-04 ENCOUNTER — Emergency Department (HOSPITAL_COMMUNITY)
Admission: EM | Admit: 2023-05-04 | Discharge: 2023-05-05 | Disposition: A | Discharge status: Home / Self Care | Attending: Emergency Medicine | Admitting: Emergency Medicine

## 2023-05-04 ENCOUNTER — Encounter (HOSPITAL_COMMUNITY): Payer: Self-pay

## 2023-05-04 DIAGNOSIS — K5792 Diverticulitis of intestine, part unspecified, without perforation or abscess without bleeding: Secondary | ICD-10-CM

## 2023-05-04 DIAGNOSIS — D72829 Elevated white blood cell count, unspecified: Secondary | ICD-10-CM | POA: Diagnosis not present

## 2023-05-04 DIAGNOSIS — R1031 Right lower quadrant pain: Secondary | ICD-10-CM | POA: Diagnosis present

## 2023-05-04 DIAGNOSIS — K5732 Diverticulitis of large intestine without perforation or abscess without bleeding: Secondary | ICD-10-CM | POA: Insufficient documentation

## 2023-05-04 DIAGNOSIS — Z975 Presence of (intrauterine) contraceptive device: Secondary | ICD-10-CM

## 2023-05-04 LAB — COMPREHENSIVE METABOLIC PANEL WITH GFR
ALT: 10 U/L (ref 0–44)
AST: 15 U/L (ref 15–41)
Albumin: 4 g/dL (ref 3.5–5.0)
Alkaline Phosphatase: 52 U/L (ref 38–126)
Anion gap: 10 (ref 5–15)
BUN: 8 mg/dL (ref 6–20)
CO2: 22 mmol/L (ref 22–32)
Calcium: 9.2 mg/dL (ref 8.9–10.3)
Chloride: 105 mmol/L (ref 98–111)
Creatinine, Ser: 0.62 mg/dL (ref 0.44–1.00)
GFR, Estimated: >60 mL/min (ref >60)
Glucose, Bld: 88 mg/dL (ref 70–99)
Potassium: 3.7 mmol/L (ref 3.5–5.1)
Sodium: 137 mmol/L (ref 135–145)
Total Bilirubin: 1.1 mg/dL (ref 0.0–1.2)
Total Protein: 7.8 g/dL (ref 6.5–8.1)

## 2023-05-04 LAB — URINALYSIS, ROUTINE W REFLEX MICROSCOPIC
Bilirubin Urine: NEGATIVE
Glucose, UA: NEGATIVE mg/dL
Hgb urine dipstick: NEGATIVE
Ketones, ur: NEGATIVE mg/dL
Leukocytes,Ua: NEGATIVE
Nitrite: NEGATIVE
Protein, ur: NEGATIVE mg/dL
Specific Gravity, Urine: 1.013 (ref 1.005–1.030)
pH: 5 (ref 5.0–8.0)

## 2023-05-04 LAB — CBC
HCT: 40.7 % (ref 36.0–46.0)
Hemoglobin: 13 g/dL (ref 12.0–15.0)
MCH: 27.9 pg (ref 26.0–34.0)
MCHC: 31.9 g/dL (ref 30.0–36.0)
MCV: 87.3 fL (ref 80.0–100.0)
Platelets: 258 10*3/uL (ref 150–400)
RBC: 4.66 MIL/uL (ref 3.87–5.11)
RDW: 13.2 % (ref 11.5–15.5)
WBC: 11.4 10*3/uL — ABNORMAL HIGH (ref 4.0–10.5)
nRBC: 0 % (ref 0.0–0.2)

## 2023-05-04 LAB — LIPASE, BLOOD: Lipase: 31 U/L (ref 11–51)

## 2023-05-04 LAB — HCG, SERUM, QUALITATIVE: Preg, Serum: NEGATIVE

## 2023-05-04 MED ORDER — MORPHINE SULFATE (PF) 4 MG/ML IV SOLN
4.0000 mg | Freq: Once | INTRAVENOUS | Status: AC
  Administered 2023-05-04: 4 mg via INTRAVENOUS
  Filled 2023-05-04: qty 1

## 2023-05-04 MED ORDER — ONDANSETRON 4 MG PO TBDP
4.0000 mg | ORAL_TABLET | Freq: Once | ORAL | Status: AC | PRN
  Administered 2023-05-04: 4 mg via ORAL
  Filled 2023-05-04: qty 1

## 2023-05-04 MED ORDER — ONDANSETRON HCL 4 MG/2ML IJ SOLN
4.0000 mg | Freq: Once | INTRAMUSCULAR | Status: AC
  Administered 2023-05-04: 4 mg via INTRAVENOUS
  Filled 2023-05-04: qty 2

## 2023-05-04 NOTE — ED Triage Notes (Signed)
 Pt c.o lower abd pain x1 week with nausea. Pt seen by her PCP today, had a urine test and xray done but sent here for further eval.

## 2023-05-05 ENCOUNTER — Emergency Department (HOSPITAL_COMMUNITY)

## 2023-05-05 MED ORDER — ONDANSETRON 4 MG PO TBDP: 4.0000 mg | ORAL_TABLET | Freq: Three times a day (TID) | ORAL | 0 refills | Status: DC | PRN

## 2023-05-05 MED ORDER — IOHEXOL 350 MG/ML SOLN
75.0000 mL | Freq: Once | INTRAVENOUS | Status: AC | PRN
  Administered 2023-05-05: 75 mL via INTRAVENOUS

## 2023-05-05 MED ORDER — HYDROCODONE-ACETAMINOPHEN 5-325 MG PO TABS: 1.0000 | ORAL_TABLET | ORAL | 0 refills | Status: DC | PRN

## 2023-05-05 NOTE — ED Notes (Signed)
 Patient transported to CT

## 2023-05-05 NOTE — Discharge Instructions (Addendum)
 You were diagnosed today with diverticulitis which is inflammation of part of the intestine.  Please follow the attached clear liquid diet.  This should be followed for the next few days until symptoms subside.  I have prescribed pain medication and nausea medication.  If you have severe pain or uncontrollable nausea after taking the medication, you should seek reevaluation.  Please schedule a follow-up appointment with primary care and with gastroenterology for further evaluation of your diverticulitis.  Your CT scan did show a possibly malpositioned intrauterine device.  Please schedule follow-up with GYN for further evaluation.

## 2023-05-05 NOTE — ED Provider Notes (Signed)
 [place] EMERGENCY DEPARTMENT AT [facility] Provider Note   CSN: [id number] Arrival date & time: 05/04/23  1952     History  Chief Complaint  Patient presents with   Abdominal Pain    [name] [name] is a 38 y.o. female.  Patient presents the emergency room with 1 week of abdominal pain with nausea.  She endorses pain that is worse in the right lower quadrant but also has pain across the lower abdomen.  She endorses nausea with occasional emesis throughout the day today.  Patient denies diarrhea, blood in stool, vaginal bleeding, vaginal discharge, fever, chest pain, shortness of breath.  Patient with past medical history significant for iron  deficiency anemia, migraines, GERD   Abdominal Pain      Home Medications Prior to Admission medications   Medication Sig Start Date End Date Taking? Authorizing Provider  HYDROcodone -acetaminophen  (NORCO/VICODIN) 5-325 MG tablet Take 1 tablet by mouth every 4 (four) hours as needed for severe pain (pain score 7-10). 05/05/23  Yes Abelardo Hoehn B, PA-C  ondansetron  (ZOFRAN -ODT) 4 MG disintegrating tablet Take 1 tablet (4 mg total) by mouth every 8 (eight) hours as needed for nausea or vomiting. 05/05/23  Yes Elisa Guest, PA-C  acetaminophen  (TYLENOL ) 500 MG tablet Take 1,000 mg by mouth every 6 (six) hours as needed.    [provider]  cyclobenzaprine  (FLEXERIL ) 10 MG tablet Take 10 mg by mouth 4 (four) times daily as needed.    [provider]  famotidine  (PEPCID ) 20 MG tablet Take 1 tablet (20 mg total) by mouth 2 (two) times daily. 08/15/21   Leftwich-Kirby, Darren Em, CNM  ferrous sulfate  325 (65 FE) MG EC tablet Take 325 mg by mouth 3 (three) times daily with meals.    [provider]  hydrocortisone  (ANUSOL -HC) 2.5 % rectal cream Place rectally 4 (four) times daily. 03/29/22   Grice, Vivian B, CNM  ibuprofen  (ADVIL ) 600 MG tablet Take 1 tablet (600 mg total) by mouth every 6 (six) hours. 03/29/22    Grice, Vivian B, CNM  oxyCODONE -acetaminophen  (PERCOCET/ROXICET) 5-325 MG tablet Take 1 tablet by mouth every 6 (six) hours as needed for severe pain. 09/19/22   Lind Repine, MD  oxyCODONE -acetaminophen  (PERCOCET/ROXICET) 5-325 MG tablet Take 1 tablet by mouth every 6 (six) hours as needed for severe pain. 10/08/22   Thomes Flicker, PA  sertraline  (ZOLOFT ) 50 MG tablet Take 1 tablet (50 mg total) by mouth at bedtime. 02/06/20 03/17/22  Grice, Vivian B, CNM      Allergies    Other    Review of Systems   Review of Systems  Gastrointestinal:  Positive for abdominal pain.    Physical Exam Updated Vital Signs BP 116/71 (BP Location: Right Arm)   Pulse 79   Temp 98.2 F (36.8 C) (Temporal)   Resp 18   LMP 04/05/2023   SpO2 100%  Physical Exam Vitals and nursing note reviewed.  Constitutional:      General: She is not in acute distress.    Appearance: She is well-developed.  HENT:     Head: Normocephalic and atraumatic.  Eyes:     Conjunctiva/sclera: Conjunctivae normal.  Cardiovascular:     Rate and Rhythm: Normal rate and regular rhythm.     Heart sounds: No murmur heard. Pulmonary:     Effort: Pulmonary effort is normal. No respiratory distress.     Breath sounds: Normal breath sounds.  Abdominal:     Palpations: Abdomen is soft.  Tenderness: There is abdominal tenderness in the right lower quadrant and left lower quadrant.     Comments: Pain worse in RLQ, but also present in LLQ  Musculoskeletal:        General: No swelling.     Cervical back: Neck supple.  Skin:    General: Skin is warm and dry.     Capillary Refill: Capillary refill takes less than 2 seconds.  Neurological:     Mental Status: She is alert.  Psychiatric:        Mood and Affect: Mood normal.     ED Results / Procedures / Treatments   Labs (all labs ordered are listed, but only abnormal results are displayed) Labs Reviewed  CBC - Abnormal; Notable for the following components:      Result Value    WBC 11.4 (*)    All other components within normal limits  LIPASE, BLOOD  COMPREHENSIVE METABOLIC PANEL WITH GFR  URINALYSIS, ROUTINE W REFLEX MICROSCOPIC  HCG, SERUM, QUALITATIVE    EKG None  Radiology CT ABDOMEN PELVIS W CONTRAST Result Date: 05/05/2023 CLINICAL DATA:  Right lower quadrant abdominal pain EXAM: CT ABDOMEN AND PELVIS WITH CONTRAST TECHNIQUE: Multidetector CT imaging of the abdomen and pelvis was performed using the standard protocol following bolus administration of intravenous contrast. RADIATION DOSE REDUCTION: This exam was performed according to the departmental dose-optimization program which includes automated exposure control, adjustment of the mA and/or kV according to patient size and/or use of iterative reconstruction technique. CONTRAST:  75mL OMNIPAQUE  IOHEXOL  350 MG/ML SOLN COMPARISON:  None Available. FINDINGS: Lower chest: No acute abnormality. Hepatobiliary: No focal liver abnormality is seen. No gallstones, gallbladder wall thickening, or biliary dilatation. Pancreas: Unremarkable Spleen: Unremarkable Adrenals/Urinary Tract: Adrenal glands are unremarkable. Kidneys are normal, without renal calculi, focal lesion, or hydronephrosis. Bladder is unremarkable. Stomach/Bowel: There is circumferential mural thickening, pericolonic inflammatory stranding, and mild free intraperitoneal fluid pelvis all in keeping with changes of acute sigmoid diverticulitis. Background mild sigmoid diverticulosis. No loculated intra-abdominal fluid collections are seen. No free intraperitoneal gas. No evidence of obstruction. Stomach, small bowel, and large bowel otherwise unremarkable. Appendix normal. Vascular/Lymphatic: No significant vascular findings are present. No enlarged abdominal or pelvic lymph nodes. Reproductive: Intrauterine device is seen within the endometrial cavity but appears malposition with its right lateral arm penetrating the myometrium, best seen on axial image # 63/3 a  and sagittal image # 66/7. Rim calcified uterine fibroid arises exophytically from the right uterine fundus. Corpus luteum within the left ovary. The pelvic organs are otherwise unremarkable. Other: No abdominal wall hernia. Musculoskeletal: No acute or significant osseous findings. IMPRESSION: 1. Acute sigmoid diverticulitis. No evidence of obstruction or perforation. 2. Malpositioned intrauterine device with its right lateral arm penetrating the myometrium. Gynecologic consultation is recommended. Electronically Signed   By: Worthy Heads M.D.   On: 05/05/2023 01:12    Procedures Procedures    Medications Ordered in ED Medications  ondansetron  (ZOFRAN -ODT) disintegrating tablet 4 mg (4 mg Oral Given 05/04/23 2008)  ondansetron  (ZOFRAN ) injection 4 mg (4 mg Intravenous Given 05/04/23 2355)  morphine  (PF) 4 MG/ML injection 4 mg (4 mg Intravenous Given 05/04/23 2355)  iohexol  (OMNIPAQUE ) 350 MG/ML injection 75 mL (75 mLs Intravenous Contrast Given 05/05/23 0028)    ED Course/ Medical Decision Making/ A&P                                 Medical  Decision Making Amount and/or Complexity of Data Reviewed Labs: ordered. Radiology: ordered.  Risk Prescription drug management.   This patient presents to the ED for concern of abdominal pain, this involves an extensive number of treatment options, and is a complaint that carries with it a high risk of complications and morbidity.  The differential diagnosis includes appendicitis, cholecystitis, diverticulitis, others   Co morbidities that complicate the patient evaluation  GERD   Additional history obtained:  Additional history obtained from family at bedside External records from outside source obtained and reviewed including primary care notes   Lab Tests:  I Ordered, and personally interpreted labs.  The pertinent results include: Mild leukocytosis with a white count of 11,400   Imaging Studies ordered:  I ordered imaging  studies including CT abdomen pelvis with contrast I independently visualized and interpreted imaging which showed  1. Acute sigmoid diverticulitis. No evidence of obstruction or  perforation.  2. Malpositioned intrauterine device with its right lateral arm  penetrating the myometrium.   I agree with the radiologist interpretation    Problem List / ED Course / Critical interventions / Medication management   I ordered medication including morphine  for pain, Zofran  for nausea Reevaluation of the patient after these medicines showed that the patient improved I have reviewed the patients home medicines and have made adjustments as needed   Consultations Obtained:  I requested consultation with the on-call OB/GYN, Dr.Pickens,  and discussed lab and imaging findings as well as pertinent plan - they recommend: outpatient follow up for possible malpositioned IUD.  Feel this is likely incidental finding and not related to current pain based on longevity of symptoms and timing of placement of IUD   Social Determinants of Health:  Patient has Medicaid for her primary health insurance type   Test / Admission - Considered:  Patient tolerating pain.  Passed p.o. challenge.  Patient stable for discharge home.  Abdominal pain appears to be due to acute diverticulitis with no complicating factors.  Pain medication and nausea medication provided along with diet instructions.  Plan to have patient follow-up with gastroenterology for consideration of possible colonoscopy in the future.  Patient provided return precautions including worsening pain or intractable nausea/vomiting. Dr. Aquilla Knapp does not feel that the CT findings of malpositioned IUD are likely related to current pain presentation.  This seems reasonable based on timing of symptoms and findings consistent with acute diverticulitis.  Patient stable for outpatient follow-up with GYN for further evaluation of IUD.         Final Clinical  Impression(s) / ED Diagnoses Final diagnoses:  Acute diverticulitis    Rx / DC Orders ED Discharge Orders          Ordered    HYDROcodone -acetaminophen  (NORCO/VICODIN) 5-325 MG tablet  Every 4 hours PRN        05/05/23 0311    ondansetron  (ZOFRAN -ODT) 4 MG disintegrating tablet  Every 8 hours PRN        05/05/23 0311              Elisa Guest, PA-C 05/05/23 0315    Alissa April, MD 05/05/23 [phone]

## 2023-05-06 ENCOUNTER — Encounter: Payer: Self-pay | Admitting: Physician Assistant

## 2023-06-14 NOTE — Progress Notes (Unsigned)
 Brigitte Canard, PA-C [address] [place], [place]  [address] Phone: [phone]   Gastroenterology Consultation  Referring Provider:     No ref. provider found Primary Care Physician:  System, Provider Not In Primary Gastroenterologist:  Brigitte Canard, PA-C / Darol Elizabeth, MD  Reason for Consultation:     Diverticulitis        HPI:   [name] [name] is a 38 y.o. y/o female referred for consultation & management  by System, Provider Not In.  New patient.  Here for ED follow-up of acute diverticulitis.  She went to [facility] ED 05/04/2023 with RLQ pain and diffuse lower abdominal pain.  Had mild nausea and vomiting.  No diarrhea, hematochezia, or fever.  Abdominal pelvic CT with contrast showed acute sigmoid diverticulitis with no abscess or perforation.  Malpositioned IUD with its right lateral arm penetrating the myometrium.  GYN follow-up was recommended.  Labs showed mild elevated WBC 11.4, normal hemoglobin 13.0.  Normal CMP and lipase.  Negative hCG.  Negative UA.  She was not given any antibiotics for diverticulitis.  Told to follow-up with GI outpatient for colonoscopy.  Patient saw her gynecologist in the past month who removed and replaced her IUD.  Current symptoms: She continues to have diffuse generalized lower abdominal pain.  Also having epigastric pain.  Continues to have nausea but no vomiting.  No fever in the past 4 days.  She has chronic constipation for many years.  Currently having bowel movement every 5 days.  She tried MiraLAX which did not help.  Then she tried Linzess which caused watery diarrhea, unable to tolerate.  Has history of rectal bleeding on and off for 2 or 3 years which she attributes to hemorrhoids.  She has a lot of intestinal gas.  Has history of acid reflux for many years.  Currently taking OTC omeprazole  and famotidine  which is not controlling her acid reflux.  Takes Zofran  as needed for nausea.  Denies any alcohol use.  Denies weight loss.  Has  weight gain.    Patient reports having a positive H. pylori test in 2019 when she lived in [place].  She was unable to tolerate or complete H. pylori treatment.  Has not had any repeat H. pylori test since then.  [place]  in [place] and has not had any GI care since then.  PMH: Migraines, GERD.  No previous GI evaluation or colonoscopy.  No family history of colon cancer  Past Medical History:  Diagnosis Date   Anemia 2009   Anxiety    COVID-19    Exposure to x-rays 2012   normal   Family history of breast cancer    Family history of uterine cancer    H. 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 Authorizing Provider  acetaminophen  (TYLENOL ) 500 MG tablet Take 1,000 mg by mouth every 6 (six) hours as needed.    [provider]  cyclobenzaprine  (FLEXERIL ) 10 MG tablet Take 10 mg by mouth 4 (four) times daily as  needed.    [provider]  famotidine  (PEPCID ) 20 MG tablet Take 1 tablet (20 mg total) by mouth 2 (two) times daily. 08/15/21   Leftwich-Kirby, Darren Em, CNM  ferrous sulfate  325 (65 FE) MG EC tablet Take 325 mg by mouth 3 (three) times daily with meals.    [provider]  HYDROcodone -acetaminophen  (NORCO/VICODIN) 5-325 MG tablet Take 1 tablet by mouth every 4 (four) hours as needed for severe pain (pain score 7-10). 05/05/23   Elisa Guest, PA-C  hydrocortisone  (ANUSOL -HC) 2.5 % rectal cream Place rectally 4 (four) times daily. 03/29/22   Grice, Vivian B, CNM  ibuprofen  (ADVIL ) 600 MG tablet Take 1 tablet (600 mg total) by mouth every 6 (six) hours. 03/29/22    Grice, Vivian B, CNM  ondansetron  (ZOFRAN -ODT) 4 MG disintegrating tablet Take 1 tablet (4 mg total) by mouth every 8 (eight) hours as needed for nausea or vomiting. 05/05/23   Elisa Guest, PA-C  oxyCODONE -acetaminophen  (PERCOCET/ROXICET) 5-325 MG tablet Take 1 tablet by mouth every 6 (six) hours as needed for severe pain. 10/08/22   Thomes Flicker, PA  sertraline  (ZOLOFT ) 50 MG tablet Take 1 tablet (50 mg total) by mouth at bedtime. 02/06/20 03/17/22  Anice Kerbs, CNM    Family History  Problem Relation Age of Onset   Asthma Mother    Cancer Mother 34       breast, cervix and bone and liver   Breast cancer Mother 50       mets to bone and liver   Asthma Father    Cancer Other    Heart disease Other    Stroke Other    Thyroid  disease Other    Diabetes Other    Hypertension Other    Hyperlipidemia Other    Hypertension Maternal Aunt    Heart disease Maternal Grandmother    Hypertension Maternal Grandmother    Uterine cancer Maternal Grandmother 60   Hypertension Maternal Grandfather    Heart Problems Maternal Grandfather    Breast cancer Maternal Aunt 50   Heart Problems Maternal Uncle    Heart Problems Maternal Uncle    Anesthesia problems Neg Hx      Social History   Tobacco Use   Smoking status: Never   Smokeless tobacco: Never  Vaping Use   Vaping status: Never Used  Substance Use Topics   Alcohol use: No   Drug use: No    Allergies as of 06/15/2023 - Review Complete 06/15/2023  Allergen Reaction Noted   Other Anxiety and Rash 07/28/2011    Review of Systems:    All systems reviewed and negative except where noted in HPI.   Physical Exam:  BP 108/64 (BP Location: Left Arm, Patient Position: Sitting, Cuff Size: Normal)   Pulse 72   Ht 5\' 2"  (1.575 m) Comment: Height measured without shoes  Wt 169 lb 2 oz (76.7 kg)   BMI 30.93 kg/m  No LMP recorded.  General:   Alert,  Well-developed, well-nourished, pleasant and cooperative in NAD Lungs:  Respirations  even and unlabored.  Clear throughout to auscultation.   No wheezes, crackles, or rhonchi. No acute distress. Heart:  Regular rate and rhythm; no murmurs, clicks, rubs, or gallops. Abdomen:  Normal bowel sounds.  No bruits.  Soft, and non-distended without masses, hepatosplenomegaly or hernias noted.  Moderate epigastric and bilateral lower abdominal tenderness.  No guarding or rebound tenderness.    Neurologic:  Alert and oriented x3;  grossly normal neurologically.  Psych:  Alert and cooperative. Normal mood and affect.  Imaging Studies: No results found.  Labs: CBC    Component Value Date/Time   WBC 11.4 (H) 05/04/2023 2020   RBC 4.66 05/04/2023 2020   HGB 13.0 05/04/2023 2020   HGB 8.9 (L) 03/21/2022 1105   HCT 40.7 05/04/2023 2020   HCT 30.7 (L) 01/18/2020 1433   PLT 258 05/04/2023 2020   PLT 206 03/21/2022 1105   MCV 87.3 05/04/2023 2020   MCH 27.9 05/04/2023 2020   MCHC 31.9 05/04/2023 2020   RDW 13.2 05/04/2023 2020   LYMPHSABS 0.5 (L) 09/19/2022 1606   MONOABS 0.2 09/19/2022 1606   EOSABS 0.0 09/19/2022 1606   BASOSABS 0.0 09/19/2022 1606    CMP     Component Value Date/Time   NA 137 05/04/2023 2020   K 3.7 05/04/2023 2020   CL 105 05/04/2023 2020   CO2 22 05/04/2023 2020   GLUCOSE 88 05/04/2023 2020   BUN 8 05/04/2023 2020   CREATININE 0.62 05/04/2023 2020   CREATININE 0.41 (L) 03/21/2022 1105   CREATININE 0.68 11/09/2015 1723   CALCIUM  9.2 05/04/2023 2020   PROT 7.8 05/04/2023 2020   ALBUMIN 4.0 05/04/2023 2020   AST 15 05/04/2023 2020   AST 13 (L) 03/21/2022 1105   ALT 10 05/04/2023 2020   ALT 6 03/21/2022 1105   ALKPHOS 52 05/04/2023 2020   BILITOT 1.1 05/04/2023 2020   BILITOT 0.9 03/21/2022 1105   GFRNONAA >60 05/04/2023 2020   GFRNONAA >60 03/21/2022 1105   GFRNONAA >89 11/09/2015 1723   GFRAA >60 10/04/2018 2324   GFRAA >89 11/09/2015 1723    Assessment and Plan:   [name] [name] is a 38 y.o. y/o female has been referred for:   1.   Acute uncomplicated sigmoid diverticulitis; moderate bilateral lower abdominal tenderness today. - Rx Ciprofloxacillin 500 Mg twice daily x 10 days, #20, no refills. - Rx Flagyl (metronidazole) 500 mg 3 times daily x 10 days, #30, no refills. -If lower abdominal pain does not improve on antibiotics, then schedule repeat abdominal pelvic CT. - Plan to schedule colonoscopy 6 to 8 weeks after resolution of diverticulitis.  2.  Malpositioned IUD - She has followed up with her GYN who replaced her IUD in the past month.  3.  Chronic GERD - not controlled on OTC omeprazole . - Rx Pantoprazole  40mg  1 tablet once daily, #30, 5 refills. - Recommend Lifestyle Modifications to prevent Acid Reflux.  Rec. Avoid coffee, sodas, peppermint, garlic, onions, alcohol, citrus fruits, chocolate, tomatoes, fatty and spicey foods.  Avoid eating 2-3 hours before bedtime.    4.  Chronic consitpation / IBS-C / Gas - Rx Amitiza 24mcg 1 capsule twice daily with food, #60, 5 refills. - She was Unable to tolerate Linzess which caused diarrhea.  MiraLAX did not work.  5. History of  H. Pylori / Epigastric Pain - Diatherix H. Pylori Stool test - Consider EGD if upper abdominal pain worsens or persists.  Follow up with TG in 4 weeks.  Brigitte Canard, PA-C

## 2023-06-15 ENCOUNTER — Encounter: Payer: Self-pay | Admitting: Physician Assistant

## 2023-06-15 ENCOUNTER — Ambulatory Visit: Admitting: Physician Assistant

## 2023-06-15 VITALS — BP 108/64 | HR 72 | Ht 62.0 in | Wt 169.1 lb

## 2023-06-15 DIAGNOSIS — K5909 Other constipation: Secondary | ICD-10-CM | POA: Diagnosis not present

## 2023-06-15 DIAGNOSIS — R1013 Epigastric pain: Secondary | ICD-10-CM | POA: Diagnosis not present

## 2023-06-15 DIAGNOSIS — Z8619 Personal history of other infectious and parasitic diseases: Secondary | ICD-10-CM

## 2023-06-15 DIAGNOSIS — K219 Gastro-esophageal reflux disease without esophagitis: Secondary | ICD-10-CM | POA: Diagnosis not present

## 2023-06-15 DIAGNOSIS — K5732 Diverticulitis of large intestine without perforation or abscess without bleeding: Secondary | ICD-10-CM | POA: Diagnosis not present

## 2023-06-15 DIAGNOSIS — R1030 Lower abdominal pain, unspecified: Secondary | ICD-10-CM

## 2023-06-15 DIAGNOSIS — K5904 Chronic idiopathic constipation: Secondary | ICD-10-CM

## 2023-06-15 DIAGNOSIS — T8332XA Displacement of intrauterine contraceptive device, initial encounter: Secondary | ICD-10-CM

## 2023-06-15 MED ORDER — CIPROFLOXACIN HCL 500 MG PO TABS: 500.0000 mg | ORAL_TABLET | Freq: Two times a day (BID) | ORAL | 0 refills | Status: AC

## 2023-06-15 MED ORDER — PANTOPRAZOLE SODIUM 40 MG PO TBEC: 40.0000 mg | DELAYED_RELEASE_TABLET | Freq: Every day | ORAL | 3 refills | Status: AC

## 2023-06-15 MED ORDER — METRONIDAZOLE 500 MG PO TABS: 500.0000 mg | ORAL_TABLET | Freq: Three times a day (TID) | ORAL | 0 refills | Status: AC

## 2023-06-15 MED ORDER — LUBIPROSTONE 24 MCG PO CAPS: 24.0000 ug | ORAL_CAPSULE | Freq: Two times a day (BID) | ORAL | 3 refills | Status: AC

## 2023-06-15 NOTE — Patient Instructions (Addendum)
 _______________________________________________________  If your blood pressure at your visit was 140/90 or greater, please contact your primary care physician to follow up on this.  _______________________________________________________  If you are age 39 or older, your body mass index should be between 23-30. Your Body mass index is 30.93 kg/m. If this is out of the aforementioned range listed, please consider follow up with your Primary Care Provider.  If you are age 69 or younger, your body mass index should be between 19-25. Your Body mass index is 30.93 kg/m. If this is out of the aformentioned range listed, please consider follow up with your Primary Care Provider.   ________________________________________________________  The [facility] providers would like to encourage you to use MYCHART to communicate with providers for non-urgent requests or questions.  Due to long hold times on the telephone, sending your provider a message by [facility] may be a faster and more efficient way to get a response.  Please allow 48 business hours for a response.  Please remember that this is for non-urgent requests.  _______________________________________________________  We have sent the following medications to your pharmacy for you to pick up at your convenience: Cipro, Flagyl, Protonix , Amitizia   You have been scheduled for an appointment with Brigitte Canard PA-C on 07-23-23 at 9:20am . Please arrive 10 minutes early for your appointment.   Your provider has ordered "Diatherix" stool testing for you. You have received a kit from our office today containing all necessary supplies to complete this test. Please carefully read the stool collection instructions provided in the kit before opening the accompanying materials. In addition, be sure there is a label providing your full name and date of birth on the "puritan opti-swab" tube that is supplied in the kit (if you do not see a label with this  information on your test tube, please make us  aware before test collection!). After completing the test, you should secure the purtian tube into the specimen biohazard bag. The [facility] E-Req sheet (including date and time of specimen collection) should be placed into the outside pocket of the specimen biohazard bag and returned to the [facility] lab (basement floor of [facility]) within 2 days of collection. Please make sure to give the specimen to a staff member at the lab. DO NOT leave the specimen on the counter.   If the specimen date and time (can be found in the upper right boxed portion of the sheet) are not filled out on the E-Req sheet, the test will NOT be performed.   It was a pleasure to see you today!  Thank you for trusting me with your gastrointestinal care!

## 2023-06-28 NOTE — Progress Notes (Signed)
 Agree with the assessment and plan as outlined by Brigitte Canard, PA-C.

## 2023-07-22 NOTE — Progress Notes (Unsigned)
 Ellouise Console, PA-C [address] [place], [place]  [address] Phone: [phone]   Primary Care Physician: System, Provider Not In  Primary Gastroenterologist:  Ellouise Console, PA-C / Glendia Holt, MD   Chief Complaint: Follow-up diverticulitis, GERD, history of H. Pylori, IBS-C.       HPI:   [name] [name] is a 38 y.o. female returns for 6-week follow-up of acute uncomplicated sigmoid diverticulitis.  Initially confirmed on abdominal pelvic CT 05/04/23.  At her last follow-up appointment 06/15/2023 she was still having some lower abdominal tenderness.  Was prescribed Cipro  500 twice daily x 10 days and Flagyl  500 3 times daily x 10 days.  Plan was to schedule follow-up colonoscopy 6 to 8 weeks after resolution.  She was also started on Amitiza  24 mcg twice daily for IBS-C with constipation.  Previously unable to tolerate Linzess which caused diarrhea and MiraLAX did not work.  History of GERD, H. pylori, and epigastric pain.  Was restarted on pantoprazole  40 Mg daily.  Diatherix H. pylori stool test was ordered, yet not completed.  She lost the stool test.  Current Symptoms: Patient states she took Cipro  and Flagyl  for 7 days and discontinued due to adverse side effects.  On antibiotics she had worsening abdominal bloating, swelling, and nausea.  She also stopped taking Amitiza  and pantoprazole .  She states she is not constipated.  She has bowel movement daily.  She reports worsening generalized diffuse upper and lower abdominal pain bilaterally, all throughout her entire abdomen.  Abdomen feels very bloated and swollen.  Has nausea but no vomiting.  She followed up with her GYN for malpositioned IUD.    Patient reports having a positive H. pylori test in 2019 when she lived in [place].  She was unable to tolerate or complete H. pylori treatment.  Has not had any repeat H. pylori test since then.  [place] and has not had any GI care since then.   PMH:  Migraines, GERD.   No previous colonoscopy.  No family history of colon cancer  Current Outpatient Medications  Medication Sig Dispense Refill   acetaminophen  (TYLENOL ) 500 MG tablet Take 1,000 mg by mouth every 6 (six) hours as needed.     cyclobenzaprine  (FLEXERIL ) 10 MG tablet Take 10 mg by mouth 4 (four) times daily as needed.     ibuprofen  (ADVIL ) 600 MG tablet Take 1 tablet (600 mg total) by mouth every 6 (six) hours. 30 tablet 0   lubiprostone  (AMITIZA ) 24 MCG capsule Take 1 capsule (24 mcg total) by mouth 2 (two) times daily with a meal. (Patient not taking: Reported on 07/23/2023) 60 capsule 3   pantoprazole  (PROTONIX ) 40 MG tablet Take 1 tablet (40 mg total) by mouth daily. (Patient not taking: Reported on 07/23/2023) 90 tablet 3   No current facility-administered medications for this visit.    Allergies as of 07/23/2023 - Review Complete 07/23/2023  Allergen Reaction Noted   Other Anxiety and Rash 07/28/2011    Past Medical History:  Diagnosis Date   Anemia 2009   Anxiety    COVID-19    Exposure to x-rays 2012   normal   Family history of breast cancer    Family history of uterine cancer    H. pylori infection 2019   H/O candidiasis    H/O varicella    History of gastroesophageal reflux (GERD) 2010   taking with tums, helps    Migraines  2009   PUPPP (pruritic urticarial papules and plaques of pregnancy) 06/06/2011   Vaginal Pap smear, abnormal     Past Surgical History:  Procedure Laterality Date   NO PAST SURGERIES      Review of Systems:    All systems reviewed and negative except where noted in HPI.    Physical Exam:  BP 98/68   Pulse 79   Ht 5' 2 (1.575 m)   Wt 172 lb (78 kg)   BMI 31.46 kg/m  No LMP recorded.  General: Well-nourished, well-developed in no acute distress.  Lungs: Clear to auscultation bilaterally. Non-labored. Heart: Regular rate and rhythm, no murmurs rubs or gallops.  Abdomen: Bowel sounds are normal; Abdomen is Soft and  obese; No hepatosplenomegaly, masses or hernias; there is generalized to diffuse upper and lower abdominal tenderness bilaterally throughout the entire abdomen.  No masses, guarding or rebound tenderness. Neuro: Alert and oriented x 3.  Grossly intact.  Psych: Alert and cooperative, anxious mood and affect.  Imaging Studies: No results found.  Labs: CBC    Component Value Date/Time   WBC 7.6 07/23/2023 1007   RBC 4.46 07/23/2023 1007   HGB 12.3 07/23/2023 1007   HGB 8.9 (L) 03/21/2022 1105   HCT 37.3 07/23/2023 1007   HCT 30.7 (L) 01/18/2020 1433   PLT 270.0 07/23/2023 1007   PLT 206 03/21/2022 1105   MCV 83.7 07/23/2023 1007   MCH 27.9 05/04/2023 2020   MCHC 32.8 07/23/2023 1007   RDW 14.4 07/23/2023 1007   LYMPHSABS 2.9 07/23/2023 1007   MONOABS 0.4 07/23/2023 1007   EOSABS 0.1 07/23/2023 1007   BASOSABS 0.0 07/23/2023 1007    CMP     Component Value Date/Time   NA 138 07/23/2023 1007   K 3.6 07/23/2023 1007   CL 107 07/23/2023 1007   CO2 26 07/23/2023 1007   GLUCOSE 101 (H) 07/23/2023 1007   BUN 10 07/23/2023 1007   CREATININE 0.58 07/23/2023 1007   CREATININE 0.41 (L) 03/21/2022 1105   CREATININE 0.68 11/09/2015 1723   CALCIUM  8.4 07/23/2023 1007   PROT 7.3 07/23/2023 1007   ALBUMIN 4.1 07/23/2023 1007   AST 12 07/23/2023 1007   AST 13 (L) 03/21/2022 1105   ALT 6 07/23/2023 1007   ALT 6 03/21/2022 1105   ALKPHOS 53 07/23/2023 1007   BILITOT 0.4 07/23/2023 1007   BILITOT 0.9 03/21/2022 1105   GFRNONAA >60 05/04/2023 2020   GFRNONAA >60 03/21/2022 1105   GFRNONAA >89 11/09/2015 1723   GFRAA >60 10/04/2018 2324   GFRAA >89 11/09/2015 1723    Assessment and Plan:   [name] [name] is a 38 y.o. y/o female returns for follow-up of:  1.  Uncomplicated sigmoid diverticulitis diagnosed on CT 05/04/23.  Initially treated with Augmentin.  Then she completed 7 days of antibiotic treatment with Cipro  and Flagyl .  Then she discontinued antibiotics due to side  effects.  2.  Worsening generalized abdominal pain, nausea, abdominal bloating and swelling. - Labs CBC, CMP, hCG - Stat abdominal pelvic CT with contrast - If abdominal pelvic CT is normal and shows resolution of diverticulitis, then I will go ahead and schedule colonoscopy.  3.  Irritable bowel syndrome, constipation predominant.  Recently treated with Amitiza  24 mcg twice daily.  Patient discontinued treatment.  Unable to tolerate Linzess (caused diarrhea) and MiraLAX did not work. - Continue Amitiza  24 Mg twice daily  4.  Chronic GERD,  History of H. pylori (diagnosed in [place] 2019),  epigastric pain.  Recent H. pylori stool test ordered, yet not completed. - If abdominal pelvic CT is unrevealing, then schedule EGD.  Ellouise Console, PA-C  Follow up based on test results and GI symptoms.

## 2023-07-23 ENCOUNTER — Other Ambulatory Visit

## 2023-07-23 ENCOUNTER — Encounter: Payer: Self-pay | Admitting: Physician Assistant

## 2023-07-23 ENCOUNTER — Ambulatory Visit (HOSPITAL_BASED_OUTPATIENT_CLINIC_OR_DEPARTMENT_OTHER)
Admission: RE | Admit: 2023-07-23 | Discharge: 2023-07-23 | Disposition: A | Discharge status: Home / Self Care | Source: Ambulatory Visit | Attending: Physician Assistant | Admitting: Physician Assistant

## 2023-07-23 ENCOUNTER — Ambulatory Visit: Payer: Self-pay | Admitting: Physician Assistant

## 2023-07-23 ENCOUNTER — Ambulatory Visit: Admitting: Physician Assistant

## 2023-07-23 VITALS — BP 98/68 | HR 79 | Ht 62.0 in | Wt 172.0 lb

## 2023-07-23 DIAGNOSIS — K5732 Diverticulitis of large intestine without perforation or abscess without bleeding: Secondary | ICD-10-CM | POA: Diagnosis not present

## 2023-07-23 DIAGNOSIS — Z8619 Personal history of other infectious and parasitic diseases: Secondary | ICD-10-CM

## 2023-07-23 DIAGNOSIS — R110 Nausea: Secondary | ICD-10-CM

## 2023-07-23 DIAGNOSIS — R1084 Generalized abdominal pain: Secondary | ICD-10-CM | POA: Insufficient documentation

## 2023-07-23 DIAGNOSIS — Z8719 Personal history of other diseases of the digestive system: Secondary | ICD-10-CM

## 2023-07-23 DIAGNOSIS — R140 Abdominal distension (gaseous): Secondary | ICD-10-CM

## 2023-07-23 DIAGNOSIS — K581 Irritable bowel syndrome with constipation: Secondary | ICD-10-CM

## 2023-07-23 DIAGNOSIS — R1013 Epigastric pain: Secondary | ICD-10-CM

## 2023-07-23 DIAGNOSIS — K219 Gastro-esophageal reflux disease without esophagitis: Secondary | ICD-10-CM

## 2023-07-23 LAB — CBC WITH DIFFERENTIAL/PLATELET
Basophils Absolute: 0 K/uL (ref 0.0–0.1)
Basophils Relative: 0.2 % (ref 0.0–3.0)
Eosinophils Absolute: 0.1 K/uL (ref 0.0–0.7)
Eosinophils Relative: 1.7 % (ref 0.0–5.0)
HCT: 37.3 % (ref 36.0–46.0)
Hemoglobin: 12.3 g/dL (ref 12.0–15.0)
Lymphocytes Relative: 37.8 % (ref 12.0–46.0)
Lymphs Abs: 2.9 K/uL (ref 0.7–4.0)
MCHC: 32.8 g/dL (ref 30.0–36.0)
MCV: 83.7 fl (ref 78.0–100.0)
Monocytes Absolute: 0.4 K/uL (ref 0.1–1.0)
Monocytes Relative: 5.8 % (ref 3.0–12.0)
Neutro Abs: 4.1 K/uL (ref 1.4–7.7)
Neutrophils Relative %: 54.5 % (ref 43.0–77.0)
Platelets: 270 K/uL (ref 150.0–400.0)
RBC: 4.46 Mil/uL (ref 3.87–5.11)
RDW: 14.4 % (ref 11.5–15.5)
WBC: 7.6 K/uL (ref 4.0–10.5)

## 2023-07-23 LAB — COMPREHENSIVE METABOLIC PANEL WITH GFR
ALT: 6 U/L (ref 0–35)
AST: 12 U/L (ref 0–37)
Albumin: 4.1 g/dL (ref 3.5–5.2)
Alkaline Phosphatase: 53 U/L (ref 39–117)
BUN: 10 mg/dL (ref 6–23)
CO2: 26 meq/L (ref 19–32)
Calcium: 8.4 mg/dL (ref 8.4–10.5)
Chloride: 107 meq/L (ref 96–112)
Creatinine, Ser: 0.58 mg/dL (ref 0.40–1.20)
GFR: 114.64 mL/min (ref >60.00)
Glucose, Bld: 101 mg/dL — ABNORMAL HIGH (ref 70–99)
Potassium: 3.6 meq/L (ref 3.5–5.1)
Sodium: 138 meq/L (ref 135–145)
Total Bilirubin: 0.4 mg/dL (ref 0.2–1.2)
Total Protein: 7.3 g/dL (ref 6.0–8.3)

## 2023-07-23 LAB — HCG, SERUM, QUALITATIVE: Preg, Serum: NEGATIVE

## 2023-07-23 MED ORDER — IOHEXOL 300 MG/ML  SOLN
80.0000 mL | Freq: Once | INTRAMUSCULAR | Status: AC | PRN
  Administered 2023-07-23: 80 mL via INTRAVENOUS

## 2023-07-23 NOTE — Patient Instructions (Signed)
 Your provider has requested that you go to the basement level for lab work before leaving today. Press B on the elevator. The lab is located at the first door on the left as you exit the elevator.  You have been scheduled for a CT scan of the abdomen and pelvis at [facility], 1st floor Radiology. You are scheduled on 07/23/23 at 3:30 pm. You should arrive 15 minutes prior to your appointment time for registration.  You may take any medications as prescribed with a small amount of water, if necessary. If you take any of the following medications: METFORMIN, GLUCOPHAGE, GLUCOVANCE, AVANDAMET, RIOMET, FORTAMET, ACTOPLUS MET, JANUMET, GLUMETZA or METAGLIP, you MAY be asked to HOLD this medication 48 hours AFTER the exam.   The purpose of you drinking the oral contrast is to aid in the visualization of your intestinal tract. The contrast solution may cause some diarrhea. Depending on your individual set of symptoms, you may also receive an intravenous injection of x-ray contrast/dye. Plan on being at [facility] or longer, depending on the type of exam you are having performed.   If you have any questions regarding your exam or if you need to reschedule, you may call at [phone] between the hours of 8:00 am and 5:00 pm, Monday-Friday.   Follow up in 3 months  _______________________________________________________  If your blood pressure at your visit was 140/90 or greater, please contact your primary care physician to follow up on this.  _______________________________________________________  If you are age 39 or older, your body mass index should be between 23-30. Your Body mass index is 31.46 kg/m. If this is out of the aforementioned range listed, please consider follow up with your Primary Care Provider.  If you are age 39 or younger, your body mass index should be between 19-25. Your Body mass index is  31.46 kg/m. If this is out of the aformentioned range listed, please consider follow up with your Primary Care Provider.   ________________________________________________________  The [facility] providers would like to encourage you to use MYCHART to communicate with providers for non-urgent requests or questions.  Due to long hold times on the telephone, sending your provider a message by [facility] may be a faster and more efficient way to get a response.  Please allow 48 business hours for a response.  Please remember that this is for non-urgent requests.  _______________________________________________________   Due to recent changes in healthcare laws, you may see the results of your imaging and laboratory studies on MyChart before your provider has had a chance to review them.  We understand that in some cases there may be results that are confusing or concerning to you. Not all laboratory results come back in the same time frame and the provider may be waiting for multiple results in order to interpret others.  Please give us  48 hours in order for your provider to thoroughly review all the results before contacting the office for clarification of your results.   Thank you for trusting me with your gastrointestinal care!   Ellouise Console, PA-C

## 2023-07-23 NOTE — Progress Notes (Signed)
 Call and notify patient her abdominal pelvic CT is normal.  Previous diverticulitis has resolved.  No acute abnormality.  Go ahead and schedule EGD and colonoscopy.   Diagnosis for EGD: Epigastric pain and history of H. pylori.    Diagnosis for colonoscopy: Follow-up of diverticulitis.  Patient has never had a colonoscopy.  I recommend schedule procedures with Dr. Stacia.  Ellouise Console, PA-C

## 2023-07-24 MED ORDER — NA SULFATE-K SULFATE-MG SULF 17.5-3.13-1.6 GM/177ML PO SOLN: ORAL | 0 refills | Status: DC

## 2023-08-02 ENCOUNTER — Encounter (HOSPITAL_BASED_OUTPATIENT_CLINIC_OR_DEPARTMENT_OTHER): Payer: Self-pay

## 2023-08-02 ENCOUNTER — Emergency Department (HOSPITAL_BASED_OUTPATIENT_CLINIC_OR_DEPARTMENT_OTHER): Admitting: Radiology

## 2023-08-02 ENCOUNTER — Emergency Department (HOSPITAL_BASED_OUTPATIENT_CLINIC_OR_DEPARTMENT_OTHER)
Admission: EM | Admit: 2023-08-02 | Discharge: 2023-08-03 | Disposition: A | Discharge status: Home / Self Care | Attending: Emergency Medicine | Admitting: Emergency Medicine

## 2023-08-02 DIAGNOSIS — R0789 Other chest pain: Secondary | ICD-10-CM | POA: Diagnosis present

## 2023-08-02 LAB — CBC
HCT: 38.5 % (ref 36.0–46.0)
Hemoglobin: 12.5 g/dL (ref 12.0–15.0)
MCH: 27.4 pg (ref 26.0–34.0)
MCHC: 32.5 g/dL (ref 30.0–36.0)
MCV: 84.2 fL (ref 80.0–100.0)
Platelets: 283 K/uL (ref 150–400)
RBC: 4.57 MIL/uL (ref 3.87–5.11)
RDW: 13.2 % (ref 11.5–15.5)
WBC: 9.9 K/uL (ref 4.0–10.5)
nRBC: 0 % (ref 0.0–0.2)

## 2023-08-02 LAB — PREGNANCY, URINE: Preg Test, Ur: NEGATIVE

## 2023-08-02 LAB — D-DIMER, QUANTITATIVE: D-Dimer, Quant: <0.27 ug{FEU}/mL (ref 0.00–0.50)

## 2023-08-02 NOTE — ED Provider Notes (Signed)
 [place] EMERGENCY DEPARTMENT AT [facility] Provider Note   CSN: [id number] Arrival date & time: 08/02/23  2156     Patient presents with: Chest Pain   [name] [name] is a 38 y.o. female.   Chest Pain  Patient is a 38 year old female presents the ED today for acute onset chest pain and shortness of breath that began earlier's morning when she woke up.  Notes that she has had some mild left arm pain as well.  And accompanying nausea.  No heart history.  Notes that the pain in her chest feels like a knife stabbing through my heart and radiating down my left arm.  Denies fever, headache, vision changes, cough, congestion, abdominal pain, vomiting, diarrhea, hematuria, dysuria, vaginal bleeding, vaginal discharge, lower leg swelling.    Prior to Admission medications   Medication Sig Start Date End Date Taking? Authorizing Provider  methocarbamol  (ROBAXIN ) 500 MG tablet Take 1 tablet (500 mg total) by mouth 2 (two) times daily. 08/03/23  Yes Charita Lindenberger S, PA-C  naproxen  (NAPROSYN ) 500 MG tablet Take 1 tablet (500 mg total) by mouth 2 (two) times daily. 08/03/23  Yes Twana Wileman S, PA-C  acetaminophen  (TYLENOL ) 500 MG tablet Take 1,000 mg by mouth every 6 (six) hours as needed.    [provider]  cyclobenzaprine  (FLEXERIL ) 10 MG tablet Take 10 mg by mouth 4 (four) times daily as needed.    [provider]  ibuprofen  (ADVIL ) 600 MG tablet Take 1 tablet (600 mg total) by mouth every 6 (six) hours. 03/29/22   Grice, Vivian B, CNM  lubiprostone  (AMITIZA ) 24 MCG capsule Take 1 capsule (24 mcg total) by mouth 2 (two) times daily with a meal. Patient not taking: Reported on 07/23/2023 06/15/23   [place], PA-C  Na Sulfate-K Sulfate-Mg Sulfate concentrate (SUPREP) 17.5-3.13-1.6 GM/177ML SOLN Use as directed; may use generic; goodrx card if insurance will not cover generic 07/24/23   [place], PA-C  pantoprazole  (PROTONIX ) 40 MG tablet Take 1 tablet (40 mg  total) by mouth daily. Patient not taking: Reported on 07/23/2023 06/15/23   [place], PA-C    Allergies: Other    Review of Systems  Cardiovascular:  Positive for chest pain.  All other systems reviewed and are negative.   Updated Vital Signs BP 129/85   Pulse 71   Temp 98.1 F (36.7 C)   Resp 16   Ht 5' 2 (1.575 m)   Wt 77.1 kg   SpO2 100%   BMI 31.09 kg/m   Physical Exam Vitals and nursing note reviewed.  Constitutional:      General: She is not in acute distress.    Appearance: Normal appearance. She is not ill-appearing or diaphoretic.  HENT:     Head: Normocephalic and atraumatic.  Eyes:     General: No scleral icterus.       Right eye: No discharge.        Left eye: No discharge.     Extraocular Movements: Extraocular movements intact.     Conjunctiva/sclera: Conjunctivae normal.  Neck:     Vascular: No JVD.     Trachea: No tracheal deviation.  Cardiovascular:     Rate and Rhythm: Normal rate and regular rhythm.     Pulses: Normal pulses.     Heart sounds: Normal heart sounds. Heart sounds not distant. No murmur heard.    No friction rub. No gallop.  Pulmonary:     Effort: Pulmonary effort is normal. No accessory  muscle usage or respiratory distress.     Breath sounds: Normal breath sounds. No stridor. No decreased breath sounds, wheezing, rhonchi or rales.  Chest:     Chest wall: Tenderness (Notably tender to the left side of her chest over the pectoralis muscles.  No back tenderness noted.) present. No deformity, crepitus or edema. There is no dullness to percussion.  Abdominal:     General: Abdomen is flat.     Palpations: Abdomen is soft.     Tenderness: There is no abdominal tenderness. There is no right CVA tenderness, left CVA tenderness, guarding or rebound.  Musculoskeletal:        General: No swelling or deformity.     Cervical back: Normal range of motion and neck supple. No rigidity or tenderness.     Right lower leg: No tenderness. No  edema.     Left lower leg: No tenderness. No edema.  Skin:    General: Skin is warm and dry.     Findings: No ecchymosis, erythema, lesion or rash.     Nails: There is no clubbing.  Neurological:     General: No focal deficit present.     Mental Status: She is alert and oriented to person, place, and time. Mental status is at baseline.     Cranial Nerves: No cranial nerve deficit.     Motor: No weakness.  Psychiatric:     Comments: Patient appears anxious on exam.  But after having physical exam, patient is notably less anxious and appears calm.     (all labs ordered are listed, but only abnormal results are displayed) Labs Reviewed  BASIC METABOLIC PANEL WITH GFR  CBC  PREGNANCY, URINE  D-DIMER, QUANTITATIVE  TROPONIN T, HIGH SENSITIVITY  TROPONIN T, HIGH SENSITIVITY    EKG: EKG Interpretation Date/Time:  Sunday August 02 2023 22:06:15 EDT Ventricular Rate:  80 PR Interval:  164 QRS Duration:  86 QT Interval:  378 QTC Calculation: 435 R Axis:   21  Text Interpretation: Normal sinus rhythm Cannot rule out Anterior infarct , age undetermined Abnormal ECG When compared with ECG of 19-Sep-2022 15:38, PREVIOUS ECG IS PRESENT Confirmed by Lorette Mayo [phone]) on 08/03/2023 12:14:14 AM  Radiology: [facility] Chest 2 View Result Date: 08/02/2023 CLINICAL DATA:  Chest pain EXAM: CHEST - 2 VIEW COMPARISON:  09/19/2022 FINDINGS: The heart size and mediastinal contours are within normal limits. Both lungs are clear. The visualized skeletal structures are unremarkable. No pneumothorax. IMPRESSION: No active cardiopulmonary disease. Electronically Signed   By: Franky Crease M.D.   On: 08/02/2023 22:29    Procedures   Medications Ordered in the ED  ketorolac  (TORADOL ) 15 MG/ML injection 15 mg (has no administration in time range)    Medical Decision Making Amount and/or Complexity of Data Reviewed Labs: ordered. Radiology: ordered.   This patient is a 38 year old female  who presents to  the ED for concern of Left chest pain rating down arm that began suddenly this morning when she woke up.  States that she has never felt like this before.  Saying that it feels like a knife going through her heart.  Denies any heavy lifting or activity changes.  Reports some shortness of breath at the time but has had symptoms significantly improved since then.  On physical exam, patient is in no acute distress, afebrile, alert and orient x 4, speaking in full sentences, nontachypneic, nontachycardic.  Appears to be anxious during initial part of the exam however throughout the course  of the exam patient noted to be remarkably more calm.  LCTAB, RRR, no murmur.  Did have some chest wall tenderness noted to left side of her chest over the pectoris muscle.  Normal neuroexam otherwise.  No lower leg edema.  Unremarkable exam otherwise.  The patient's presentation, baseline labs and atypical chest pain labs were ordered.  On reevaluation, patient notes that she is significantly improved with symptoms lessened.  Labs and imaging were unremarkable.  Low suspicion for any emergent cause of her symptoms today.  Will provide some Toradol  for her suspecting likely muscle strain as cause of patient's symptoms today.  Will provide naproxen  and naproxen  for her to go home with.  Patient vital signs have remained stable throughout the course of patient's time in the ED. Low suspicion for any other emergent pathology at this time. I believe this patient is safe to be discharged. Provided strict return to ER precautions. Patient expressed agreement and understanding of plan. All questions were answered.  Differential diagnoses prior to evaluation: The emergent differential diagnosis includes, but is not limited to, ACS, AAS, Pulmonary Embolism, Tension Pneumothorax, Esophageal Rupture, Cardiac Tamponade, Pericarditis, Myocarditis, Pneumothorax, Pneumonia, Aortic Stenosis, CHF Exacerbation, GERD,  Esophageal  Spasm,  Mallory-Weiss, Costochondritis, Musculoskeletal Chest Wall Pain, Anxiety / Panic Attack. This is not an exhaustive differential.   Past Medical History / Co-morbidities / Social History: Anemia, anxiety, migraines,  Additional history: Chart reviewed. Pertinent results include:    Recent CT of the abdomen pelvis with contrast on 07/23/2023 showing no acute abnormalities.  Lab Tests/Imaging studies: I personally interpreted labs/imaging and the pertinent results include:   CBC unremarkable BMP unremarkable D-dimer negative  Urine pregnancy is negative  Chest x-ray unremarkable.  I agree with the radiologist interpretation.  Cardiac monitoring: EKG obtained and interpreted by myself and attending physician which shows: NSR  EKG Interpretation Date/Time:  Sunday August 02 2023 22:06:15 EDT Ventricular Rate:  80 PR Interval:  164 QRS Duration:  86 QT Interval:  378 QTC Calculation: 435 R Axis:   21  Text Interpretation: Normal sinus rhythm Cannot rule out Anterior infarct , age undetermined Abnormal ECG When compared with ECG of 19-Sep-2022 15:38, PREVIOUS ECG IS PRESENT Confirmed by Mesner, Jason ([id number]) on 08/03/2023 12:14:14 AM          Medications: I ordered medication including Toradol.  I have reviewed the patients home medicines and have made adjustments as needed.  Critical Interventions:  Social Determinants of Health: None  Disposition: After consideration of the diagnostic results and the patients response to treatment, I feel that the patient would benefit from discharge home as above.   emergency department workup does not suggest an emergent condition requiring admission or immediate intervention beyond what has been performed at this time. The plan is: Follow-up with PCP, return to the ED for new or worsening symptoms, symptomatic management at home. The patient is safe for discharge and has been instructed to return immediately for worsening symptoms,  change in symptoms or any other concerns.  Final diagnoses:  Left-sided chest wall pain    ED Discharge Orders          Ordered    naproxen (NAPROSYN) 500 MG tablet  2 times daily        08/03/23 0009    methocarbamol (ROBAXIN) 500 MG tablet  2 times daily        07 /28/25 0009               Beola Derry  S, PA-C 08/03/23 0014    Geraldene Hamilton, MD 08/11/23 1039

## 2023-08-02 NOTE — Progress Notes (Signed)
 Agree with the assessment and plan as outlined by Brigitte Canard, PA-C.

## 2023-08-02 NOTE — ED Triage Notes (Addendum)
 Pt states she is experiencing SHOB, CP and left arm pain/numbness, pain goes thru her chest to her back +Nausea

## 2023-08-02 NOTE — ED Notes (Signed)
 Attempted blood draw on right arm x2. Unsuccessful.

## 2023-08-03 LAB — BASIC METABOLIC PANEL WITH GFR
Anion gap: 11 (ref 5–15)
BUN: 10 mg/dL (ref 6–20)
CO2: 26 mmol/L (ref 22–32)
Calcium: 9.8 mg/dL (ref 8.9–10.3)
Chloride: 102 mmol/L (ref 98–111)
Creatinine, Ser: 0.68 mg/dL (ref 0.44–1.00)
GFR, Estimated: >60 mL/min (ref >60)
Glucose, Bld: 91 mg/dL (ref 70–99)
Potassium: 3.6 mmol/L (ref 3.5–5.1)
Sodium: 139 mmol/L (ref 135–145)

## 2023-08-03 LAB — TROPONIN T, HIGH SENSITIVITY: Troponin T High Sensitivity: <15 ng/L (ref <19)

## 2023-08-03 MED ORDER — METHOCARBAMOL 500 MG PO TABS: 500.0000 mg | ORAL_TABLET | Freq: Two times a day (BID) | ORAL | 0 refills | Status: AC

## 2023-08-03 MED ORDER — KETOROLAC TROMETHAMINE 15 MG/ML IJ SOLN
15.0000 mg | Freq: Once | INTRAMUSCULAR | Status: AC
  Administered 2023-08-03: 15 mg via INTRAVENOUS
  Filled 2023-08-03: qty 1

## 2023-08-03 MED ORDER — NAPROXEN 500 MG PO TABS: 500.0000 mg | ORAL_TABLET | Freq: Two times a day (BID) | ORAL | 0 refills | Status: AC

## 2023-08-03 NOTE — Discharge Instructions (Addendum)
 You were seen today for left-sided chest pain.  This is most likely musculoskeletal as when I pressed in your chest you had a lot of pain and your physical exam, labs, imaging otherwise were very reassuring that I have low suspicion for any emergent causes of your symptoms today.  I have prescribed an anti-inflammatory as well as a muscle relaxer for this.  You can also use heat over the area as well as stretching to help relieve pain.  Please take Naprosyn , 500mg  by mouth twice daily as needed for pain - this in an antiinflammatory medicine (NSAID) and is similar to ibuprofen  - many people feel that it is stronger than ibuprofen  and it is easier to take since it is a smaller pill.  Please use this only for 1 week - if your pain persists, you will need to follow up with your doctor in the office for ongoing guidance and pain control.   Please take Robaxin , 500 mg up to twice a day as needed for muscle spasm, this is a muscle relaxer, it may cause generalized weakness, sleepiness and you should not drive or do important things while taking this medication.  For any persistent symptoms, follow-up with PCP, however if you begin having new or worsening symptoms return to the ED.

## 2023-09-02 ENCOUNTER — Ambulatory Visit: Admitting: Gastroenterology

## 2023-09-02 ENCOUNTER — Encounter: Payer: Self-pay | Admitting: Gastroenterology

## 2023-09-02 VITALS — BP 120/82 | HR 72 | Temp 98.0°F | Resp 16 | Ht 62.0 in | Wt 169.0 lb

## 2023-09-02 DIAGNOSIS — K3189 Other diseases of stomach and duodenum: Secondary | ICD-10-CM | POA: Diagnosis not present

## 2023-09-02 DIAGNOSIS — B9681 Helicobacter pylori [H. pylori] as the cause of diseases classified elsewhere: Secondary | ICD-10-CM | POA: Diagnosis not present

## 2023-09-02 DIAGNOSIS — K2950 Unspecified chronic gastritis without bleeding: Secondary | ICD-10-CM

## 2023-09-02 DIAGNOSIS — D123 Benign neoplasm of transverse colon: Secondary | ICD-10-CM

## 2023-09-02 DIAGNOSIS — Z8719 Personal history of other diseases of the digestive system: Secondary | ICD-10-CM

## 2023-09-02 DIAGNOSIS — D125 Benign neoplasm of sigmoid colon: Secondary | ICD-10-CM

## 2023-09-02 DIAGNOSIS — K5730 Diverticulosis of large intestine without perforation or abscess without bleeding: Secondary | ICD-10-CM

## 2023-09-02 DIAGNOSIS — K219 Gastro-esophageal reflux disease without esophagitis: Secondary | ICD-10-CM | POA: Diagnosis not present

## 2023-09-02 MED ORDER — SODIUM CHLORIDE 0.9 % IV SOLN: 500.0000 mL | Freq: Once | INTRAVENOUS | Status: DC

## 2023-09-02 NOTE — Patient Instructions (Signed)

## 2023-09-02 NOTE — Progress Notes (Signed)
 Called to room to assist during endoscopic procedure.  Patient ID and intended procedure confirmed with present staff. Received instructions for my participation in the procedure from the performing physician.

## 2023-09-02 NOTE — Progress Notes (Signed)
 Vss nad trans to pacu

## 2023-09-02 NOTE — Progress Notes (Signed)
 Pt's states no medical or surgical changes since previsit or office visit.

## 2023-09-02 NOTE — Op Note (Signed)
 [facility] Patient Name: [name] [name] Procedure Date: 09/02/2023 2:22 PM MRN: [id number] Endoscopist: Glendia E. Stacia , MD, [id number] Age: 38 Referring MD:  Date of Birth: [birth year] Gender: Female Account #: [id number] Procedure:                Colonoscopy Indications:              Follow-up of diverticulitis Medicines:                Monitored Anesthesia Care Procedure:                Pre-Anesthesia Assessment:                           - Prior to the procedure, a History and Physical                            was performed, and patient medications and                            allergies were reviewed. The patient's tolerance of                            previous anesthesia was also reviewed. The risks                            and benefits of the procedure and the sedation                            options and risks were discussed with the patient.                            All questions were answered, and informed consent                            was obtained. Prior Anticoagulants: The patient has                            taken no anticoagulant or antiplatelet agents. ASA                            Grade Assessment: II - A patient with mild systemic                            disease. After reviewing the risks and benefits,                            the patient was deemed in satisfactory condition to                            undergo the procedure.                           After obtaining informed consent, the colonoscope  was passed under direct vision. Throughout the                            procedure, the patient's blood pressure, pulse, and                            oxygen saturations were monitored continuously. The                            Olympus Scope SN [id number] was introduced through the                            anus and advanced to the the terminal ileum, with                            identification of the  appendiceal orifice and IC                            valve. The colonoscopy was performed without                            difficulty. The patient tolerated the procedure                            well. The quality of the bowel preparation was                            good. The terminal ileum, ileocecal valve,                            appendiceal orifice, and rectum were photographed.                            The bowel preparation used was SUPREP via split                            dose instruction. Scope In: 3:01:34 PM Scope Out: 3:17:25 PM Scope Withdrawal Time: 0 hours 9 minutes 45 seconds  Total Procedure Duration: 0 hours 15 minutes 51 seconds  Findings:                 The perianal and digital rectal examinations were                            normal. Pertinent negatives include normal                            sphincter tone and no palpable rectal lesions.                           Two sessile polyps were found in the transverse                            colon. The polyps were 4 to 5 mm in size.  These                            polyps were removed with a cold snare. Resection                            and retrieval were complete. Estimated blood loss                            was minimal.                           A 3 mm polyp was found in the sigmoid colon. The                            polyp was sessile. The polyp was removed with a                            cold snare. Resection and retrieval were complete.                            Estimated blood loss was minimal.                           Multiple medium-mouthed and small-mouthed                            diverticula were found in the sigmoid colon and                            descending colon.                           The exam was otherwise normal throughout the                            examined colon.                           The terminal ileum appeared normal.                           The  retroflexed view of the distal rectum and anal                            verge was normal and showed no anal or rectal                            abnormalities. Complications:            No immediate complications. Estimated Blood Loss:     Estimated blood loss was minimal. Impression:               - Two 4 to 5 mm polyps in the transverse colon,  removed with a cold snare. Resected and retrieved.                           - One 3 mm polyp in the sigmoid colon, removed with                            a cold snare. Resected and retrieved.                           - Moderate diverticulosis in the sigmoid colon and                            in the descending colon.                           - The examined portion of the ileum was normal.                           - The distal rectum and anal verge are normal on                            retroflexion view. Recommendation:           - Patient has a contact number available for                            emergencies. The signs and symptoms of potential                            delayed complications were discussed with the                            patient. Return to normal activities tomorrow.                            Written discharge instructions were provided to the                            patient.                           - Resume previous diet.                           - Continue present medications.                           - Await pathology results.                           - Repeat colonoscopy (date not yet determined) for                            surveillance based on pathology results.                           -  Recommend daily fiber supplement to reduce risk                            of recurrent diverticulitis. Illona Bulman E. Stacia, MD 09/02/2023 3:27:43 PM This report has been signed electronically.

## 2023-09-02 NOTE — Progress Notes (Signed)
 [facility] History and Physical   Primary Care Physician:  System, Provider Not In   Reason for Procedure:   Follow up diverticulitis, uncontrolled GERD symptoms  Plan:    EGD, colonoscopy     HPI: Dayona Shaheen is a 38 y.o. female undergoing colonoscopy following an episode of acute uncomplicated diverticulitis in April.  She also has multiple chronic GI symptoms such as heartburn and generalized abdominal pain as well as chronic constipation.  These symptoms have been going on for years   She has a history of H. Pylori diagnosed in [place], but apparently did not tolerate treatment.   Past Medical History:  Diagnosis Date   Anemia 2009   Anxiety    COVID-19    Exposure to x-rays 2012   normal   Family history of breast cancer    Family history of uterine cancer    H. 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 Authorizing Provider  acetaminophen  (TYLENOL ) 500 MG tablet Take 1,000 mg by mouth every 6 (six) hours as needed.    [provider]  cyclobenzaprine  (FLEXERIL ) 10 MG tablet Take 10 mg by mouth 4 (four) times daily as needed.    [provider]  ibuprofen  (ADVIL ) 600 MG tablet Take 1 tablet (600 mg total) by mouth every 6 (six) hours. 03/29/22   Grice, Vivian B, CNM  lubiprostone  (AMITIZA ) 24 MCG capsule Take 1 capsule (24 mcg total) by mouth 2 (two) times daily with a meal. Patient not taking: No sig reported 06/15/23   [place], PA-C  methocarbamol  (ROBAXIN ) 500 MG tablet Take 1 tablet (500 mg total) by mouth 2 (two) times daily. 08/03/23   Beola Terrall RAMAN, PA-C   naproxen  (NAPROSYN ) 500 MG tablet Take 1 tablet (500 mg total) by mouth 2 (two) times daily. 08/03/23   Beola Terrall RAMAN, PA-C  pantoprazole  (PROTONIX ) 40 MG tablet Take 1 tablet (40 mg total) by mouth daily. Patient not taking: No sig reported 06/15/23   [place], PA-C    Current Outpatient Medications  Medication Sig Dispense Refill   acetaminophen  (TYLENOL ) 500 MG tablet Take 1,000 mg by mouth every 6 (six) hours as needed.     cyclobenzaprine  (FLEXERIL ) 10 MG tablet Take 10 mg by mouth 4 (four) times daily as needed.     ibuprofen  (ADVIL ) 600 MG tablet Take 1 tablet (600 mg total) by mouth every 6 (six) hours. 30 tablet 0   lubiprostone  (AMITIZA ) 24 MCG capsule Take 1 capsule (24 mcg total) by mouth 2 (two) times daily with a meal. (Patient not taking: No sig reported) 60 capsule 3   methocarbamol  (ROBAXIN ) 500 MG tablet Take 1 tablet (500 mg total) by mouth 2 (two) times daily. 20 tablet 0   naproxen  (NAPROSYN ) 500 MG tablet Take 1 tablet (500 mg total) by mouth 2 (two) times daily. 30 tablet 0   pantoprazole  (PROTONIX ) 40 MG tablet Take 1 tablet (40 mg total) by mouth daily. (Patient not taking: No sig reported) 90 tablet 3   Current Facility-Administered Medications  Medication Dose Route Frequency Provider Last Rate Last Admin   0.9 %  sodium chloride  infusion  500 mL Intravenous Once Stacia Glendia BRAVO, MD        Allergies as of 09/02/2023 - Review Complete 09/02/2023  Allergen Reaction Noted   Other Anxiety and Rash 07/28/2011    Family History  Problem Relation Age of Onset   Asthma Mother    Cancer Mother 88       breast, cervix and bone and liver   Breast cancer Mother 50       mets to bone and liver   Asthma Father    Cancer Other    Heart disease Other    Stroke Other    Thyroid  disease Other    Diabetes Other    Hypertension Other    Hyperlipidemia Other    Hypertension Maternal Aunt    Heart disease Maternal Grandmother    Hypertension Maternal  Grandmother    Uterine cancer Maternal Grandmother 60   Hypertension Maternal Grandfather    Heart Problems Maternal Grandfather    Breast cancer Maternal Aunt 50   Heart Problems Maternal Uncle    Heart Problems Maternal Uncle    Anesthesia problems Neg Hx     Social History   Socioeconomic History   Marital status: Married    Spouse name: Not on file   Number of children: Not on file   Years of education: Not on file   Highest education level: Not on file  Occupational History   Occupation: unemployed   Tobacco Use   Smoking status: Never   Smokeless tobacco: Never  Vaping Use   Vaping status: Never Used  Substance and Sexual Activity   Alcohol use: No   Drug use: No   Sexual activity: Yes    Birth control/protection: None  Other Topics Concern   Not on file  Social History Narrative   Moved to [place] from [place] 7 years ago.   Married, husband age 88.   Muslim-no pork, needs female provider.    Mom is actively involved with patient. She has been here for 25 years.    Social Drivers of Corporate investment banker Strain: Not on file  Food Insecurity: No Food Insecurity (09/11/2020)   Hunger Vital Sign    Worried About Running Out of Food in the Last Year: Never true    Ran Out of Food in the Last Year: Never true  Transportation Needs: No Transportation Needs (09/11/2020)   PRAPARE - Administrator, Civil Service (Medical): No    Lack of Transportation (Non-Medical): No  Physical Activity: Not on file  Stress: Not on file  Social Connections: Not on file  Intimate Partner Violence: Not on file    Review of Systems:  All other review of systems negative except as mentioned in the HPI.  Physical Exam: Vital signs BP 122/88   Pulse 78   Temp 98 F (36.7 C)   Ht 5' 2 (1.575 m)   Wt 169 lb (76.7 kg)   SpO2 98%   BMI 30.91 kg/m   General:   Alert,  Well-developed, well-nourished, pleasant and cooperative in NAD Airway:  Mallampati  2 Lungs:  Clear throughout to auscultation.   Heart:  Regular rate and rhythm; no murmurs, clicks, rubs,  or gallops. Abdomen:  Soft, nontender and nondistended. Normal bowel sounds.   Neuro/Psych:  Normal mood and affect. A and O x 3   Catina Nuss E. Stacia,  MD [facility]

## 2023-09-02 NOTE — Op Note (Signed)
 [facility] Patient Name: [name] [name] Procedure Date: 09/02/2023 2:29 PM MRN: [id number] Endoscopist: Glendia E. Stacia , MD, [id number] Age: 38 Referring MD:  Date of Birth: 08/05/1985 Gender: Female Account #: [id number] Procedure:                Upper GI endoscopy Indications:              Generalized abdominal pain, Suspected                            gastro-esophageal reflux disease Medicines:                Monitored Anesthesia Care Procedure:                Pre-Anesthesia Assessment:                           - Prior to the procedure, a History and Physical                            was performed, and patient medications and                            allergies were reviewed. The patient's tolerance of                            previous anesthesia was also reviewed. The risks                            and benefits of the procedure and the sedation                            options and risks were discussed with the patient.                            All questions were answered, and informed consent                            was obtained. Prior Anticoagulants: The patient has                            taken no anticoagulant or antiplatelet agents. ASA                            Grade Assessment: II - A patient with mild systemic                            disease. After reviewing the risks and benefits,                            the patient was deemed in satisfactory condition to                            undergo the procedure.  After obtaining informed consent, the endoscope was                            passed under direct vision. Throughout the                            procedure, the patient's blood pressure, pulse, and                            oxygen saturations were monitored continuously. The                            Olympus Scope [id number] was introduced through the                            mouth, and advanced to the  second part of duodenum.                            The upper GI endoscopy was accomplished without                            difficulty. The patient tolerated the procedure                            well. Scope In: Scope Out: Findings:                 The examined portions of the nasopharynx,                            oropharynx and larynx were normal.                           The examined esophagus was normal.                           Diffuse mild mucosal changes characterized by                            erythema were found in the gastric fundus and in                            the gastric body. Biopsies were taken with a cold                            forceps for Helicobacter pylori testing. Estimated                            blood loss was minimal.                           A few small yellowish papules were found in the                            prepyloric region of the stomach. Biopsies were  taken with a cold forceps for histology. Estimated                            blood loss was minimal. These biopsies were placed                            in the jar as the gastric body/fundus biopsies.                           The exam of the stomach was otherwise normal.                           The examined duodenum was normal. Complications:            No immediate complications. Estimated Blood Loss:     Estimated blood loss was minimal. Impression:               - The examined portions of the nasopharynx,                            oropharynx and larynx were normal.                           - Normal esophagus.                           - Erythematous mucosa in the gastric fundus and                            gastric body. Biopsied.                           - A few yellowish papules found in the stomach.                            Biopsied. 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 These                            biopsies were placed in the same jar as the  gastric                            body/fundus biopsies                           - Normal examined duodenum. Recommendation:           - Patient has a contact number available for                            emergencies. The signs and symptoms of potential                            delayed complications were discussed with the                            patient. Return to normal activities  tomorrow.                            Written discharge instructions were provided to the                            patient.                           - Resume previous diet.                           - Continue present medications.                           - Await pathology results. Alzina Golda E. Stacia, MD 09/02/2023 3:35:39 PM This report has been signed electronically.

## 2023-09-03 ENCOUNTER — Telehealth: Payer: Self-pay

## 2023-09-03 NOTE — Telephone Encounter (Signed)
  Follow up Call-     09/02/2023    1:36 PM  Call back number  Post procedure Call Back phone  # [phone]  Permission to leave phone message Yes     Patient questions:  Do you have a fever, pain , or abdominal swelling? No. Pain Score  0 *  Have you tolerated food without any problems? Yes.    Have you been able to return to your normal activities? Yes.    Do you have any questions about your discharge instructions: Diet   No. Medications  No. Follow up visit  No.  Do you have questions or concerns about your Care? No.  Actions: * If pain score is 4 or above: No action needed, pain <4.

## 2023-09-08 ENCOUNTER — Ambulatory Visit: Payer: Self-pay | Admitting: Gastroenterology

## 2023-09-08 ENCOUNTER — Other Ambulatory Visit: Payer: Self-pay

## 2023-09-08 DIAGNOSIS — A048 Other specified bacterial intestinal infections: Secondary | ICD-10-CM

## 2023-09-08 LAB — SURGICAL PATHOLOGY

## 2023-09-08 MED ORDER — DOXYCYCLINE HYCLATE 100 MG PO CAPS
100.0000 mg | ORAL_CAPSULE | Freq: Two times a day (BID) | ORAL | 0 refills | Status: AC
Start: 2023-09-08 — End: ?

## 2023-09-08 MED ORDER — OMEPRAZOLE 20 MG PO CPDR: 20.0000 mg | DELAYED_RELEASE_CAPSULE | Freq: Two times a day (BID) | ORAL | 0 refills | Status: AC

## 2023-09-08 MED ORDER — METRONIDAZOLE 250 MG PO TABS: 250.0000 mg | ORAL_TABLET | Freq: Four times a day (QID) | ORAL | 0 refills | Status: AC

## 2023-09-08 MED ORDER — BISMUTH 262 MG PO CHEW: 524.0000 mg | CHEWABLE_TABLET | Freq: Four times a day (QID) | ORAL | 0 refills | Status: AC

## 2023-09-08 NOTE — Progress Notes (Signed)
 [name] [name],  The biopsies from your recent upper GI Endoscopy were notable for H. Pylori gastritis.  H. pylori is a common bacteria which can cause chronic symptoms of abdominal pain, nausea and bloating.  It can also cause iron  deficiency anemia as well as increase the risk of stomach ulcers and stomach cancer.  We need to eradicate the bacteria.  Sometimes the bacteria is very difficult to eradicate, so it is  important to take the medications as directed. Please take the medications below:  1) Omeprazole  20 mg 2 times a day x 14 d 2) Pepto Bismol 2 tabs (262 mg each) 4 times a day x 14 d 3) Metronidazole  250 mg 4 times a day x 14 d 4) doxycycline  100 mg 2 times a day x 14 d  4 weeks after treatment completed, check H. Pylori stool test to confirm eradication (must be off acid suppression therapy for 2 weeks prior to specimen submission)   The polyps that I removed during your recent procedure were completely benign but were proven to be pre-cancerous polyps that MAY have grown into cancers if they had not been removed.  Studies shows that at least 20% of women over age 47 and 30% of men over age 64 have pre-cancerous polyps.  Based on current nationally recognized surveillance guidelines, I recommend that you have a repeat colonoscopy in 5 years.

## 2023-10-01 ENCOUNTER — Other Ambulatory Visit: Payer: Self-pay | Admitting: Gastroenterology

## 2023-11-30 ENCOUNTER — Encounter (HOSPITAL_COMMUNITY): Payer: Self-pay

## 2023-11-30 ENCOUNTER — Emergency Department (HOSPITAL_COMMUNITY)

## 2023-11-30 ENCOUNTER — Other Ambulatory Visit: Payer: Self-pay

## 2023-11-30 ENCOUNTER — Emergency Department (HOSPITAL_COMMUNITY)
Admission: EM | Admit: 2023-11-30 | Discharge: 2023-11-30 | Disposition: A | Discharge status: Home / Self Care | Attending: Emergency Medicine | Admitting: Emergency Medicine

## 2023-11-30 DIAGNOSIS — R0602 Shortness of breath: Secondary | ICD-10-CM | POA: Insufficient documentation

## 2023-11-30 DIAGNOSIS — K5792 Diverticulitis of intestine, part unspecified, without perforation or abscess without bleeding: Secondary | ICD-10-CM | POA: Diagnosis not present

## 2023-11-30 DIAGNOSIS — R079 Chest pain, unspecified: Secondary | ICD-10-CM | POA: Diagnosis present

## 2023-11-30 LAB — URINALYSIS, W/ REFLEX TO CULTURE (INFECTION SUSPECTED)
Bacteria, UA: NONE SEEN
Bilirubin Urine: NEGATIVE
Glucose, UA: NEGATIVE mg/dL
Ketones, ur: NEGATIVE mg/dL
Leukocytes,Ua: NEGATIVE
Nitrite: NEGATIVE
Protein, ur: NEGATIVE mg/dL
Specific Gravity, Urine: 1.006 (ref 1.005–1.030)
pH: 5 (ref 5.0–8.0)

## 2023-11-30 LAB — COMPREHENSIVE METABOLIC PANEL WITH GFR
ALT: 9 U/L (ref 0–44)
AST: 19 U/L (ref 15–41)
Albumin: 4.3 g/dL (ref 3.5–5.0)
Alkaline Phosphatase: 80 U/L (ref 38–126)
Anion gap: 10 (ref 5–15)
BUN: 9 mg/dL (ref 6–20)
CO2: 21 mmol/L — ABNORMAL LOW (ref 22–32)
Calcium: 9.3 mg/dL (ref 8.9–10.3)
Chloride: 105 mmol/L (ref 98–111)
Creatinine, Ser: 0.63 mg/dL (ref 0.44–1.00)
GFR, Estimated: >60 mL/min (ref >60)
Glucose, Bld: 105 mg/dL — ABNORMAL HIGH (ref 70–99)
Potassium: 4 mmol/L (ref 3.5–5.1)
Sodium: 136 mmol/L (ref 135–145)
Total Bilirubin: 0.6 mg/dL (ref 0.0–1.2)
Total Protein: 8.1 g/dL (ref 6.5–8.1)

## 2023-11-30 LAB — CBC WITH DIFFERENTIAL/PLATELET
Abs Immature Granulocytes: 0.05 K/uL (ref 0.00–0.07)
Basophils Absolute: 0 K/uL (ref 0.0–0.1)
Basophils Relative: 0 %
Eosinophils Absolute: 0.1 K/uL (ref 0.0–0.5)
Eosinophils Relative: 1 %
HCT: 39.8 % (ref 36.0–46.0)
Hemoglobin: 12.7 g/dL (ref 12.0–15.0)
Immature Granulocytes: 0 %
Lymphocytes Relative: 20 %
Lymphs Abs: 2.4 K/uL (ref 0.7–4.0)
MCH: 27.3 pg (ref 26.0–34.0)
MCHC: 31.9 g/dL (ref 30.0–36.0)
MCV: 85.4 fL (ref 80.0–100.0)
Monocytes Absolute: 0.5 K/uL (ref 0.1–1.0)
Monocytes Relative: 4 %
Neutro Abs: 9.3 K/uL — ABNORMAL HIGH (ref 1.7–7.7)
Neutrophils Relative %: 75 %
Platelets: 309 K/uL (ref 150–400)
RBC: 4.66 MIL/uL (ref 3.87–5.11)
RDW: 13.2 % (ref 11.5–15.5)
WBC: 12.3 K/uL — ABNORMAL HIGH (ref 4.0–10.5)
nRBC: 0 % (ref 0.0–0.2)

## 2023-11-30 LAB — TROPONIN T, HIGH SENSITIVITY: Troponin T High Sensitivity: <15 ng/L (ref 0–19)

## 2023-11-30 LAB — HCG, SERUM, QUALITATIVE: Preg, Serum: NEGATIVE

## 2023-11-30 LAB — LIPASE, BLOOD: Lipase: 27 U/L (ref 11–51)

## 2023-11-30 MED ORDER — KETOROLAC TROMETHAMINE 15 MG/ML IJ SOLN
15.0000 mg | Freq: Once | INTRAMUSCULAR | Status: AC
  Administered 2023-11-30: 15 mg via INTRAVENOUS
  Filled 2023-11-30: qty 1

## 2023-11-30 MED ORDER — CIPROFLOXACIN IN D5W 400 MG/200ML IV SOLN
400.0000 mg | Freq: Once | INTRAVENOUS | Status: AC
  Administered 2023-11-30: 400 mg via INTRAVENOUS
  Filled 2023-11-30: qty 200

## 2023-11-30 MED ORDER — IOHEXOL 350 MG/ML SOLN
100.0000 mL | Freq: Once | INTRAVENOUS | Status: AC | PRN
  Administered 2023-11-30: 100 mL via INTRAVENOUS

## 2023-11-30 MED ORDER — CIPROFLOXACIN HCL 500 MG PO TABS: 500.0000 mg | ORAL_TABLET | Freq: Two times a day (BID) | ORAL | 0 refills | Status: AC

## 2023-11-30 MED ORDER — SODIUM CHLORIDE 0.9 % IV BOLUS
1000.0000 mL | Freq: Once | INTRAVENOUS | Status: AC
  Administered 2023-11-30: 1000 mL via INTRAVENOUS

## 2023-11-30 MED ORDER — METRONIDAZOLE 500 MG/100ML IV SOLN
500.0000 mg | Freq: Once | INTRAVENOUS | Status: AC
  Administered 2023-11-30: 500 mg via INTRAVENOUS
  Filled 2023-11-30: qty 100

## 2023-11-30 MED ORDER — ONDANSETRON HCL 4 MG/2ML IJ SOLN
4.0000 mg | Freq: Once | INTRAMUSCULAR | Status: AC
  Administered 2023-11-30: 4 mg via INTRAVENOUS
  Filled 2023-11-30: qty 2

## 2023-11-30 MED ORDER — ONDANSETRON 4 MG PO TBDP: 4.0000 mg | ORAL_TABLET | Freq: Three times a day (TID) | ORAL | 0 refills | Status: AC | PRN

## 2023-11-30 MED ORDER — METRONIDAZOLE 500 MG PO TABS
500.0000 mg | ORAL_TABLET | Freq: Two times a day (BID) | ORAL | 0 refills | Status: AC
Start: 2023-11-30 — End: ?

## 2023-11-30 NOTE — ED Notes (Signed)
 Patient alert and oriented x 4. Airway patent, respirations even and unlabored. Skin normal, warm and dry. Discharge instructions discussed with patient and patient's family. Patient has no questions at this time. Patient has safe ride home.

## 2023-11-30 NOTE — Discharge Instructions (Addendum)
 Return for any problem.  Follow up with your GI providers in outpatient setting as instructed.

## 2023-11-30 NOTE — ED Notes (Signed)
 Patient reports CP/low abdominal pain/dysuria since yesterday. Denies fever/SOB. Finished antibx for H. Pylori infection 3 weeks ago. Patient is alert and oriented x 4. Airway patent, respirations even and unlabored. Skin normal, warm and dry. patient has no complaints at this time. Siderails up x 2. Call light at bedside.

## 2023-11-30 NOTE — ED Provider Notes (Signed)
 [place] EMERGENCY DEPARTMENT AT [name] Provider Note   CSN: [id number] Arrival date & time: 11/30/23  9159     Patient presents with: Chest Pain and Shortness of Breath   [name] is a 38 y.o. female.   38 year old female with prior medical history as detailed below presents for evaluation per patient complains of 2 primary complaints.  Complaint 1 is of a sharp stabbing left-sided chest pain.  This began last night.  She complains that she feels short of breath.  She is visibly anxious about this complaint.  She reports that with breathing she has increased pain to the left chest.  She also complains of tenderness with palpation to the left chest wall.  She denies any trauma.  She denies any fever.  Her second complaint is of diffuse abdominal pain.  She reports that she feels constipated she reports burning pain throughout her entire abdomen.  She clutches that her abdomen when she demonstrates where her pain is.  She denies vomiting.  She reports nausea.  The history is provided by the patient and medical records.       Prior to Admission medications   Medication Sig Start Date End Date Taking? Authorizing Provider  acetaminophen  (TYLENOL ) 500 MG tablet Take 1,000 mg by mouth every 6 (six) hours as needed.    [provider]  Bismuth  262 MG CHEW Chew 524 mg by mouth in the morning, at noon, in the evening, and at bedtime. 09/08/23   Stacia Glendia BRAVO, MD  cyclobenzaprine  (FLEXERIL ) 10 MG tablet Take 10 mg by mouth 4 (four) times daily as needed.    [provider]  doxycycline  (VIBRAMYCIN ) 100 MG capsule Take 1 capsule (100 mg total) by mouth 2 (two) times daily. 09/08/23   Stacia Glendia BRAVO, MD  ibuprofen  (ADVIL ) 600 MG tablet Take 1 tablet (600 mg total) by mouth every 6 (six) hours. 03/29/22   Grice, Vivian B, CNM  lubiprostone  (AMITIZA ) 24 MCG capsule Take 1 capsule (24 mcg total) by mouth 2 (two) times daily with a meal. Patient not  taking: No sig reported 06/15/23   [place], PA-C  methocarbamol  (ROBAXIN ) 500 MG tablet Take 1 tablet (500 mg total) by mouth 2 (two) times daily. 08/03/23   Beola [place]all RAMAN, PA-C  metroNIDAZOLE  (FLAGYL ) 250 MG tablet Take 1 tablet (250 mg total) by mouth 4 (four) times daily. 09/08/23   Stacia Glendia BRAVO, MD  naproxen  (NAPROSYN ) 500 MG tablet Take 1 tablet (500 mg total) by mouth 2 (two) times daily. 08/03/23   [place]ola [place] RAMAN, PA-C  omeprazole  (PRILOSEC) 20 MG capsule Take 1 capsule (20 mg total) by mouth 2 (two) times daily before a meal. 09/08/23   Stacia Glendia BRAVO, MD  pantoprazole  (PROTONIX ) 40 MG tablet Take 1 tablet (40 mg total) by mouth daily. Patient not taking: No sig reported 06/15/23   [place], PA-C    Allergies: Other    Review of Systems  All other systems reviewed and are negative.   Updated Vital Signs BP 116/83 (BP Location: Left Arm)   Pulse 80   Temp 97.8 F (36.6 C) (Oral)   Resp 19   SpO2 100%   Physical Exam Vitals and nursing note reviewed.  Constitutional:      General: She is not in acute distress.    Appearance: She is well-developed.  HENT:     Head: Normocephalic and atraumatic.  Eyes:     Conjunctiva/sclera: Conjunctivae normal.  Cardiovascular:     Rate and Rhythm: Normal rate and regular rhythm.     Heart sounds: No murmur heard. Pulmonary:     Effort: Pulmonary effort is normal. No respiratory distress.     Breath sounds: Normal breath sounds.  Abdominal:     Palpations: Abdomen is soft.     Tenderness: There is no abdominal tenderness.  Musculoskeletal:        General: No swelling.     Cervical back: Neck supple.  Skin:    General: Skin is warm and dry.     Capillary Refill: Capillary refill takes less than 2 seconds.  Neurological:     Mental Status: She is alert.  Psychiatric:        Mood and Affect: Mood normal.     (all labs ordered are listed, but only abnormal results are displayed) Labs Reviewed  COMPREHENSIVE  METABOLIC PANEL WITH GFR - Abnormal; Notable for the following components:      Result Value   CO2 21 (*)    Glucose, Bld 105 (*)    All other components within normal limits  CBC WITH DIFFERENTIAL/PLATELET - Abnormal; Notable for the following components:   WBC 12.3 (*)    Neutro Abs 9.3 (*)    All other components within normal limits  URINALYSIS, W/ REFLEX TO CULTURE (INFECTION SUSPECTED) - Abnormal; Notable for the following components:   Color, Urine STRAW (*)    Hgb urine dipstick MODERATE (*)    All other components within normal limits  LIPASE, BLOOD  HCG, SERUM, QUALITATIVE  TROPONIN T, HIGH SENSITIVITY    EKG: EKG Interpretation Date/Time:  Monday November 30 2023 08:50:48 EST Ventricular Rate:  80 PR Interval:  153 QRS Duration:  96 QT Interval:  418 QTC Calculation: 483 R Axis:   21  Text Interpretation: Sinus rhythm Borderline T abnormalities, anterior leads Confirmed by Laurice Coy [phone]) on 11/30/2023 9:05:55 AM  Radiology: No results found.   Procedures   Medications Ordered in the ED  sodium chloride  0.9 % bolus 1,000 mL (has no administration in time range)  ondansetron  (ZOFRAN ) injection 4 mg (has no administration in time range)  ketorolac  (TORADOL ) 15 MG/ML injection 15 mg (has no administration in time range)                                    Medical Decision Making Patient is presenting with multiple complaints including chest pain, shortness of breath, abdominal pain.  Workup demonstrates presence of mild uncomplicated diverticulitis.  There is no findings suggesting PE, other pulmonary pathology, or cardiac ischemia.  EKG obtained is without acute ischemia.  Troponin is without elevation.  Other screening labs obtained are without significant acute abnormality.  Imaging reviewed by myself.   Patient reassured by workup findings.  She is appropriate for discharge home on outpatient antibiotics for treatment of  diverticulitis.  Importance of close follow-up stressed.  Strict return precautions given and understood.  Amount and/or Complexity of Data Reviewed Labs: ordered. Radiology: ordered.  Risk Prescription drug management.        Final diagnoses:  Diverticulitis    ED Discharge Orders          Ordered    ciprofloxacin  (CIPRO ) 500 MG tablet  Every 12 hours        11/30/23 1254    metroNIDAZOLE  (FLAGYL ) 500 MG tablet  2 times daily  11/30/23 1254    ondansetron  (ZOFRAN -ODT) 4 MG disintegrating tablet  Every 8 hours PRN        11/30/23 1254               Laurice Maude BROCKS, MD 11/30/23 1551

## 2023-11-30 NOTE — ED Triage Notes (Signed)
 PT arrives via POV. PT reports severe, stabbing chest pain and sob since last night. Reports associated nausea and a burning sensation in her abdomen. Pt is AxOx4.

## 2024-02-22 ENCOUNTER — Ambulatory Visit: Admitting: Orthopedic Surgery

## 2024-02-25 ENCOUNTER — Ambulatory Visit: Admitting: Orthopedic Surgery
